# Patient Record
Sex: Female | Born: 1945 | Race: White | Hispanic: No | Marital: Married | Smoking: Never smoker
Health system: Southern US, Community
[De-identification: ages and names within clinical notes are randomized; demographics above are authoritative.]

## PROBLEM LIST (undated history)

## (undated) DIAGNOSIS — E039 Hypothyroidism, unspecified: Secondary | ICD-10-CM

## (undated) DIAGNOSIS — M1990 Unspecified osteoarthritis, unspecified site: Secondary | ICD-10-CM

## (undated) DIAGNOSIS — G4700 Insomnia, unspecified: Secondary | ICD-10-CM

## (undated) DIAGNOSIS — C6990 Malignant neoplasm of unspecified site of unspecified eye: Secondary | ICD-10-CM

## (undated) DIAGNOSIS — K219 Gastro-esophageal reflux disease without esophagitis: Secondary | ICD-10-CM

## (undated) DIAGNOSIS — E785 Hyperlipidemia, unspecified: Secondary | ICD-10-CM

## (undated) HISTORY — DX: Hyperlipidemia, unspecified: E78.5

## (undated) HISTORY — PX: ABDOMINAL HYSTERECTOMY: SHX81

## (undated) HISTORY — DX: Insomnia, unspecified: G47.00

## (undated) HISTORY — DX: Hypothyroidism, unspecified: E03.9

## (undated) HISTORY — PX: ROOT CANAL: SHX2363

## (undated) HISTORY — DX: Gastro-esophageal reflux disease without esophagitis: K21.9

## (undated) HISTORY — PX: CATARACT EXTRACTION W/ INTRAOCULAR LENS  IMPLANT, BILATERAL: SHX1307

## (undated) HISTORY — PX: EYE SURGERY: SHX253

## (undated) HISTORY — DX: Unspecified osteoarthritis, unspecified site: M19.90

## (undated) HISTORY — PX: NASAL SINUS SURGERY: SHX719

## (undated) HISTORY — PX: TUBAL LIGATION: SHX77

---

## 2004-08-19 ENCOUNTER — Ambulatory Visit: Payer: Self-pay | Admitting: Family Medicine

## 2004-08-30 ENCOUNTER — Ambulatory Visit: Payer: Self-pay | Admitting: Internal Medicine

## 2005-09-21 ENCOUNTER — Ambulatory Visit: Payer: Self-pay | Admitting: Family Medicine

## 2006-10-23 ENCOUNTER — Ambulatory Visit: Payer: Self-pay | Admitting: Family Medicine

## 2006-11-09 ENCOUNTER — Ambulatory Visit: Payer: Self-pay | Admitting: Family Medicine

## 2007-11-13 ENCOUNTER — Ambulatory Visit: Payer: Self-pay | Admitting: Family Medicine

## 2008-12-31 ENCOUNTER — Ambulatory Visit: Payer: Self-pay | Admitting: Obstetrics & Gynecology

## 2009-01-13 ENCOUNTER — Ambulatory Visit: Payer: Self-pay | Admitting: Obstetrics & Gynecology

## 2009-04-14 ENCOUNTER — Ambulatory Visit: Payer: Self-pay | Admitting: Family Medicine

## 2010-01-13 ENCOUNTER — Emergency Department: Payer: Self-pay | Admitting: Emergency Medicine

## 2010-02-02 ENCOUNTER — Ambulatory Visit: Payer: Self-pay | Admitting: Ophthalmology

## 2010-04-06 ENCOUNTER — Ambulatory Visit: Payer: Self-pay | Admitting: Ophthalmology

## 2010-10-08 ENCOUNTER — Ambulatory Visit: Payer: Self-pay | Admitting: Obstetrics & Gynecology

## 2010-10-08 IMAGING — CR DG CHEST 2V
1 series · 2 of 2 positions shown · non-contrast
Comparison: none

REASON FOR EXAM: asthma
COMMENTS:

[Series 1: view not recorded · 0.17mm/px · 2 of 2 slices shown]
[im 1/2]
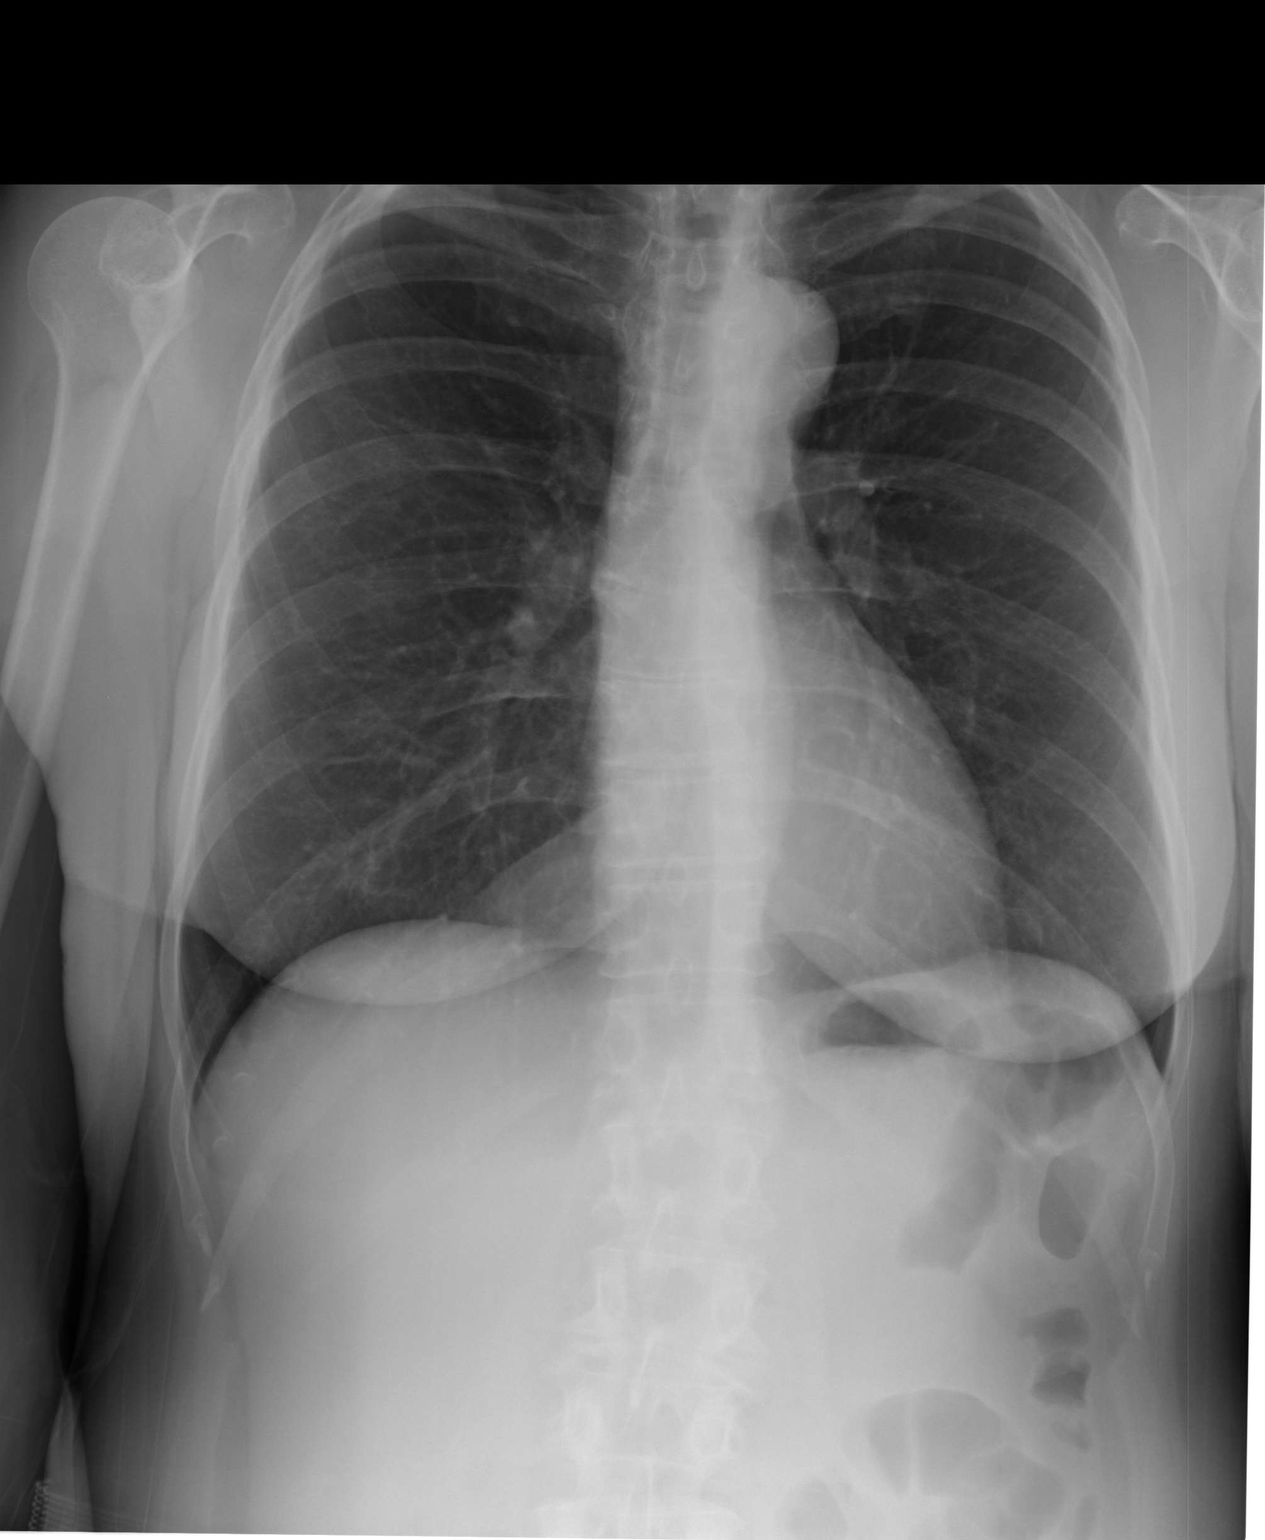
[im 2/2]
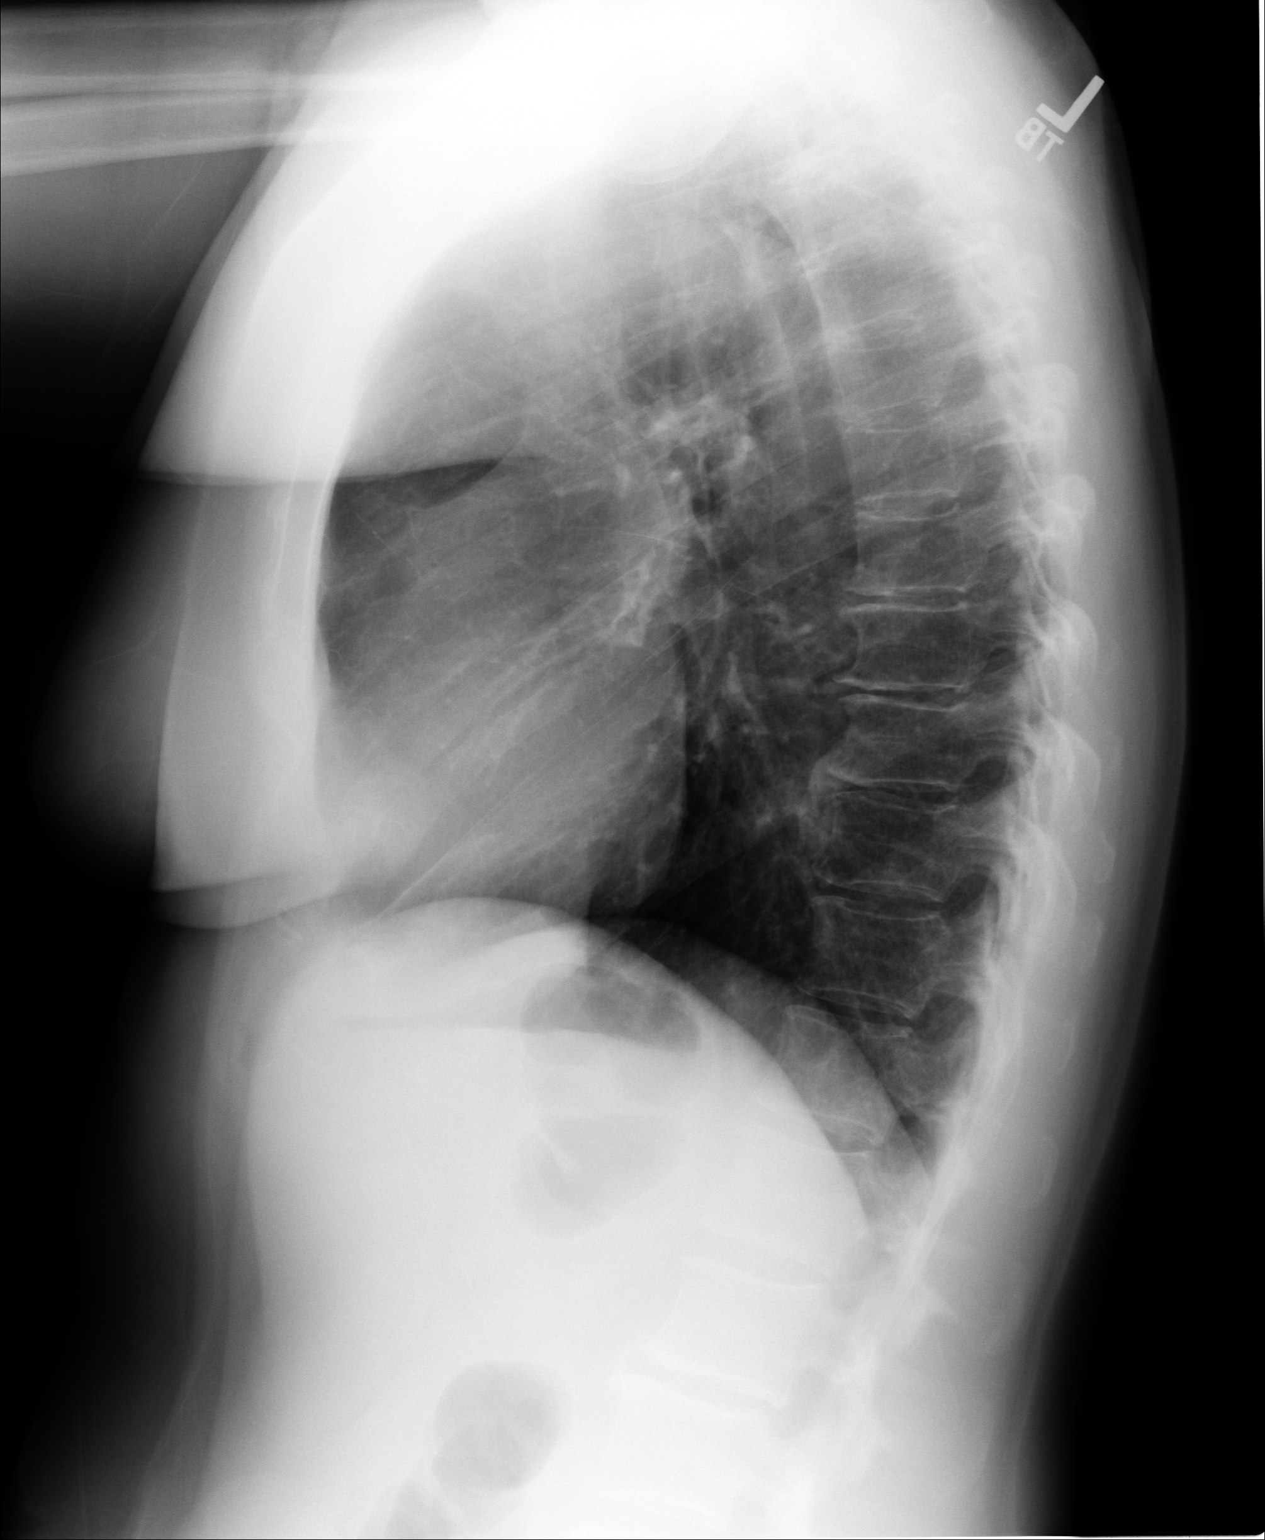

[2 of 2 positions shown; findings below may reference images not displayed]

PROCEDURE:     DXR - DXR CHEST PA (OR AP) AND LATERAL  - [DATE]  [DATE]

RESULT:     The lung fields are clear. No pneumonia, pneumothorax or pleural
effusion is seen. The chest appears mildly hyperinflated bilaterally
compatible with the clinical history of asthma. The heart, mediastinal and
osseous structures show no significant abnormalities.
IMPRESSION: 1. The lung fields are clear.
2. Heart size is normal.
3. The chest appears mildly hyperinflated bilaterally compatible with the
clinical history of asthma.

## 2010-10-14 ENCOUNTER — Ambulatory Visit: Payer: Self-pay | Admitting: Obstetrics & Gynecology

## 2010-10-18 LAB — PATHOLOGY REPORT

## 2011-02-07 ENCOUNTER — Ambulatory Visit: Payer: Self-pay | Admitting: Family Medicine

## 2011-12-13 DIAGNOSIS — E7800 Pure hypercholesterolemia, unspecified: Secondary | ICD-10-CM | POA: Insufficient documentation

## 2011-12-13 DIAGNOSIS — G4700 Insomnia, unspecified: Secondary | ICD-10-CM | POA: Insufficient documentation

## 2012-06-11 ENCOUNTER — Ambulatory Visit: Payer: Self-pay | Admitting: Family Medicine

## 2014-12-16 DIAGNOSIS — K219 Gastro-esophageal reflux disease without esophagitis: Secondary | ICD-10-CM | POA: Insufficient documentation

## 2014-12-16 DIAGNOSIS — M1990 Unspecified osteoarthritis, unspecified site: Secondary | ICD-10-CM | POA: Insufficient documentation

## 2014-12-16 DIAGNOSIS — E785 Hyperlipidemia, unspecified: Secondary | ICD-10-CM | POA: Insufficient documentation

## 2014-12-16 DIAGNOSIS — M25512 Pain in left shoulder: Secondary | ICD-10-CM | POA: Insufficient documentation

## 2014-12-16 DIAGNOSIS — M545 Low back pain, unspecified: Secondary | ICD-10-CM | POA: Insufficient documentation

## 2014-12-16 DIAGNOSIS — M25542 Pain in joints of left hand: Secondary | ICD-10-CM

## 2014-12-16 DIAGNOSIS — R531 Weakness: Secondary | ICD-10-CM | POA: Insufficient documentation

## 2014-12-16 DIAGNOSIS — G8929 Other chronic pain: Secondary | ICD-10-CM | POA: Insufficient documentation

## 2014-12-16 DIAGNOSIS — M542 Cervicalgia: Secondary | ICD-10-CM

## 2014-12-16 DIAGNOSIS — M25541 Pain in joints of right hand: Secondary | ICD-10-CM | POA: Insufficient documentation

## 2014-12-24 DIAGNOSIS — M353 Polymyalgia rheumatica: Secondary | ICD-10-CM | POA: Insufficient documentation

## 2015-01-16 DIAGNOSIS — R229 Localized swelling, mass and lump, unspecified: Secondary | ICD-10-CM | POA: Diagnosis not present

## 2015-01-21 DIAGNOSIS — M542 Cervicalgia: Secondary | ICD-10-CM | POA: Diagnosis not present

## 2015-01-21 DIAGNOSIS — E785 Hyperlipidemia, unspecified: Secondary | ICD-10-CM | POA: Diagnosis not present

## 2015-01-21 DIAGNOSIS — Z7952 Long term (current) use of systemic steroids: Secondary | ICD-10-CM | POA: Diagnosis not present

## 2015-01-21 DIAGNOSIS — M353 Polymyalgia rheumatica: Secondary | ICD-10-CM | POA: Diagnosis not present

## 2015-01-21 DIAGNOSIS — G8929 Other chronic pain: Secondary | ICD-10-CM | POA: Diagnosis not present

## 2015-01-21 DIAGNOSIS — M25512 Pain in left shoulder: Secondary | ICD-10-CM | POA: Diagnosis not present

## 2015-01-21 DIAGNOSIS — Z791 Long term (current) use of non-steroidal anti-inflammatories (NSAID): Secondary | ICD-10-CM | POA: Diagnosis not present

## 2015-01-21 DIAGNOSIS — M150 Primary generalized (osteo)arthritis: Secondary | ICD-10-CM | POA: Diagnosis not present

## 2015-01-21 DIAGNOSIS — K219 Gastro-esophageal reflux disease without esophagitis: Secondary | ICD-10-CM | POA: Diagnosis not present

## 2015-01-21 DIAGNOSIS — M545 Low back pain: Secondary | ICD-10-CM | POA: Diagnosis not present

## 2015-01-21 DIAGNOSIS — E039 Hypothyroidism, unspecified: Secondary | ICD-10-CM | POA: Diagnosis not present

## 2015-01-21 DIAGNOSIS — M25541 Pain in joints of right hand: Secondary | ICD-10-CM | POA: Diagnosis not present

## 2015-02-17 DIAGNOSIS — S8991XA Unspecified injury of right lower leg, initial encounter: Secondary | ICD-10-CM | POA: Diagnosis not present

## 2015-02-18 DIAGNOSIS — M353 Polymyalgia rheumatica: Secondary | ICD-10-CM | POA: Diagnosis not present

## 2015-02-18 DIAGNOSIS — E039 Hypothyroidism, unspecified: Secondary | ICD-10-CM | POA: Diagnosis not present

## 2015-02-18 DIAGNOSIS — M542 Cervicalgia: Secondary | ICD-10-CM | POA: Diagnosis not present

## 2015-02-18 DIAGNOSIS — M545 Low back pain: Secondary | ICD-10-CM | POA: Diagnosis not present

## 2015-02-18 DIAGNOSIS — G8929 Other chronic pain: Secondary | ICD-10-CM | POA: Diagnosis not present

## 2015-02-18 DIAGNOSIS — Z791 Long term (current) use of non-steroidal anti-inflammatories (NSAID): Secondary | ICD-10-CM | POA: Diagnosis not present

## 2015-02-18 DIAGNOSIS — M150 Primary generalized (osteo)arthritis: Secondary | ICD-10-CM | POA: Diagnosis not present

## 2015-02-18 DIAGNOSIS — M25541 Pain in joints of right hand: Secondary | ICD-10-CM | POA: Diagnosis not present

## 2015-02-18 DIAGNOSIS — R531 Weakness: Secondary | ICD-10-CM | POA: Diagnosis not present

## 2015-02-18 DIAGNOSIS — Z7952 Long term (current) use of systemic steroids: Secondary | ICD-10-CM | POA: Diagnosis not present

## 2015-02-18 DIAGNOSIS — M25542 Pain in joints of left hand: Secondary | ICD-10-CM | POA: Diagnosis not present

## 2015-03-18 DIAGNOSIS — E785 Hyperlipidemia, unspecified: Secondary | ICD-10-CM | POA: Diagnosis not present

## 2015-03-18 DIAGNOSIS — J3089 Other allergic rhinitis: Secondary | ICD-10-CM | POA: Diagnosis not present

## 2015-03-18 DIAGNOSIS — M542 Cervicalgia: Secondary | ICD-10-CM | POA: Diagnosis not present

## 2015-03-18 DIAGNOSIS — Z791 Long term (current) use of non-steroidal anti-inflammatories (NSAID): Secondary | ICD-10-CM | POA: Diagnosis not present

## 2015-03-18 DIAGNOSIS — E039 Hypothyroidism, unspecified: Secondary | ICD-10-CM | POA: Diagnosis not present

## 2015-03-18 DIAGNOSIS — M150 Primary generalized (osteo)arthritis: Secondary | ICD-10-CM | POA: Diagnosis not present

## 2015-03-18 DIAGNOSIS — K219 Gastro-esophageal reflux disease without esophagitis: Secondary | ICD-10-CM | POA: Diagnosis not present

## 2015-03-18 DIAGNOSIS — M545 Low back pain: Secondary | ICD-10-CM | POA: Diagnosis not present

## 2015-03-18 DIAGNOSIS — Z7952 Long term (current) use of systemic steroids: Secondary | ICD-10-CM | POA: Diagnosis not present

## 2015-03-18 DIAGNOSIS — M353 Polymyalgia rheumatica: Secondary | ICD-10-CM | POA: Diagnosis not present

## 2015-03-18 DIAGNOSIS — G8929 Other chronic pain: Secondary | ICD-10-CM | POA: Diagnosis not present

## 2015-03-18 DIAGNOSIS — Z23 Encounter for immunization: Secondary | ICD-10-CM | POA: Diagnosis not present

## 2015-04-03 DIAGNOSIS — Z961 Presence of intraocular lens: Secondary | ICD-10-CM | POA: Diagnosis not present

## 2015-04-15 DIAGNOSIS — M542 Cervicalgia: Secondary | ICD-10-CM | POA: Diagnosis not present

## 2015-04-15 DIAGNOSIS — M25512 Pain in left shoulder: Secondary | ICD-10-CM | POA: Diagnosis not present

## 2015-04-15 DIAGNOSIS — M25541 Pain in joints of right hand: Secondary | ICD-10-CM | POA: Diagnosis not present

## 2015-04-15 DIAGNOSIS — K219 Gastro-esophageal reflux disease without esophagitis: Secondary | ICD-10-CM | POA: Diagnosis not present

## 2015-04-15 DIAGNOSIS — Z7952 Long term (current) use of systemic steroids: Secondary | ICD-10-CM | POA: Diagnosis not present

## 2015-04-15 DIAGNOSIS — Z791 Long term (current) use of non-steroidal anti-inflammatories (NSAID): Secondary | ICD-10-CM | POA: Diagnosis not present

## 2015-04-15 DIAGNOSIS — M353 Polymyalgia rheumatica: Secondary | ICD-10-CM | POA: Diagnosis not present

## 2015-04-15 DIAGNOSIS — M545 Low back pain: Secondary | ICD-10-CM | POA: Diagnosis not present

## 2015-04-15 DIAGNOSIS — E785 Hyperlipidemia, unspecified: Secondary | ICD-10-CM | POA: Diagnosis not present

## 2015-04-15 DIAGNOSIS — R531 Weakness: Secondary | ICD-10-CM | POA: Diagnosis not present

## 2015-04-15 DIAGNOSIS — J3089 Other allergic rhinitis: Secondary | ICD-10-CM | POA: Diagnosis not present

## 2015-04-15 DIAGNOSIS — E039 Hypothyroidism, unspecified: Secondary | ICD-10-CM | POA: Diagnosis not present

## 2015-06-02 DIAGNOSIS — N390 Urinary tract infection, site not specified: Secondary | ICD-10-CM | POA: Diagnosis not present

## 2015-06-02 DIAGNOSIS — E039 Hypothyroidism, unspecified: Secondary | ICD-10-CM | POA: Diagnosis not present

## 2015-06-02 DIAGNOSIS — F5104 Psychophysiologic insomnia: Secondary | ICD-10-CM | POA: Diagnosis not present

## 2015-06-02 DIAGNOSIS — K219 Gastro-esophageal reflux disease without esophagitis: Secondary | ICD-10-CM | POA: Diagnosis not present

## 2015-06-02 DIAGNOSIS — M129 Arthropathy, unspecified: Secondary | ICD-10-CM | POA: Diagnosis not present

## 2015-06-02 DIAGNOSIS — E7800 Pure hypercholesterolemia, unspecified: Secondary | ICD-10-CM | POA: Diagnosis not present

## 2015-06-15 DIAGNOSIS — M25542 Pain in joints of left hand: Secondary | ICD-10-CM | POA: Diagnosis not present

## 2015-06-15 DIAGNOSIS — M353 Polymyalgia rheumatica: Secondary | ICD-10-CM | POA: Diagnosis not present

## 2015-06-15 DIAGNOSIS — M150 Primary generalized (osteo)arthritis: Secondary | ICD-10-CM

## 2015-06-15 DIAGNOSIS — M25541 Pain in joints of right hand: Secondary | ICD-10-CM | POA: Diagnosis not present

## 2015-06-15 DIAGNOSIS — M159 Polyosteoarthritis, unspecified: Secondary | ICD-10-CM | POA: Insufficient documentation

## 2015-06-15 DIAGNOSIS — M545 Low back pain: Secondary | ICD-10-CM | POA: Diagnosis not present

## 2015-06-15 DIAGNOSIS — Z87440 Personal history of urinary (tract) infections: Secondary | ICD-10-CM | POA: Insufficient documentation

## 2015-06-15 DIAGNOSIS — M542 Cervicalgia: Secondary | ICD-10-CM | POA: Diagnosis not present

## 2015-09-09 DIAGNOSIS — H1851 Endothelial corneal dystrophy: Secondary | ICD-10-CM | POA: Diagnosis not present

## 2015-12-07 DIAGNOSIS — M129 Arthropathy, unspecified: Secondary | ICD-10-CM | POA: Diagnosis not present

## 2015-12-07 DIAGNOSIS — E039 Hypothyroidism, unspecified: Secondary | ICD-10-CM | POA: Diagnosis not present

## 2015-12-07 DIAGNOSIS — E7800 Pure hypercholesterolemia, unspecified: Secondary | ICD-10-CM | POA: Diagnosis not present

## 2015-12-07 DIAGNOSIS — F5104 Psychophysiologic insomnia: Secondary | ICD-10-CM | POA: Diagnosis not present

## 2015-12-07 DIAGNOSIS — K219 Gastro-esophageal reflux disease without esophagitis: Secondary | ICD-10-CM | POA: Diagnosis not present

## 2015-12-09 ENCOUNTER — Other Ambulatory Visit: Payer: Self-pay

## 2015-12-09 ENCOUNTER — Ambulatory Visit (INDEPENDENT_AMBULATORY_CARE_PROVIDER_SITE_OTHER): Payer: PPO | Admitting: Gastroenterology

## 2015-12-09 ENCOUNTER — Encounter: Payer: Self-pay | Admitting: Gastroenterology

## 2015-12-09 VITALS — BP 141/89 | HR 76 | Temp 98.3°F | Ht 65.0 in | Wt 180.0 lb

## 2015-12-09 DIAGNOSIS — R1013 Epigastric pain: Secondary | ICD-10-CM | POA: Diagnosis not present

## 2015-12-09 DIAGNOSIS — R1310 Dysphagia, unspecified: Secondary | ICD-10-CM | POA: Diagnosis not present

## 2015-12-09 DIAGNOSIS — E039 Hypothyroidism, unspecified: Secondary | ICD-10-CM | POA: Insufficient documentation

## 2015-12-09 NOTE — Progress Notes (Addendum)
Gastroenterology Consultation  Referring Provider:     No ref. provider found Primary Care Physician:  [facility], Chrissie Noa, MD Primary Gastroenterologist:  Dr. Allen Norris     Reason for Consultation:     Dysphagia and GERD        HPI:   Christine Savage is a 70 y.o. y/o female referred for consultation & management of Dysphagia and GERD by Dr. Ellison Hughs, Chrissie Noa, MD.  This patient comes in today with a history of long-standing heartburn/dyspepsia. The patient states that she sometimes feels food coming up and also has a lot of burping. The patient also had a colonoscopy 11 years ago and has not had a repeat since. The patient states that one of her main reasons for coming in today is because she has some dysphagia with pills but nothing else. The patient states that one time she had sliced apples that felt like they got stuck but eventually pass. There is no report of any unexplained weight loss, fevers, chills, nausea or vomiting. The patient states that she was recommended 11 years ago to have a upper endoscopy because she had a colonoscopy without any cause for her anemia at that time. The patient does not come today with any history of anemia presently. The patient states that when she takes her pills in the morning she can he'll a feeling in her throat where the pills feel like there stuck and she has to drink copious amounts of water to get the pills down.  Past Medical History:  Diagnosis Date  . Arthritis   . GERD (gastroesophageal reflux disease)   . Hyperlipidemia   . Hypothyroidism   . Insomnia     Past Surgical History:  Procedure Laterality Date  . ROOT CANAL    . TUBAL LIGATION      Prior to Admission medications   Medication Sig Start Date End Date Taking? Authorizing Provider  acetaminophen (TYLENOL) 650 MG CR tablet Take 1,300 mg by mouth.   Yes Historical Provider, MD  Calcium Carbonate-Vitamin D 600-400 MG-UNIT tablet Take by mouth.   Yes Historical Provider, MD    Cyanocobalamin (RA VITAMIN B-12 TR) 1000 MCG TBCR Take by mouth.   Yes Historical Provider, MD  diphenhydrAMINE (BENADRYL) 25 mg capsule Take by mouth.   Yes Historical Provider, MD  docusate sodium (COLACE) 100 MG capsule Take 100 mg by mouth.   Yes Historical Provider, MD  levothyroxine (SYNTHROID, LEVOTHROID) 50 MCG tablet TAKE 1 TABLET (50 MCG TOTAL) BY MOUTH ONCE DAILY. 06/29/15  Yes Historical Provider, MD  levothyroxine (SYNTHROID, LEVOTHROID) 50 MCG tablet  09/27/15  Yes Historical Provider, MD  Omega-3 Fatty Acids (FISH OIL PO) Take by mouth.   Yes Historical Provider, MD  omeprazole (PRILOSEC) 20 MG capsule TAKE 1 CAPSULE (20 MG TOTAL) BY MOUTH ONCE DAILY AS NEEDED. 07/23/15  Yes Historical Provider, MD  pravastatin (PRAVACHOL) 20 MG tablet Take by mouth. 06/02/15 06/01/16 Yes Historical Provider, MD  sodium chloride (OCEAN) 0.65 % nasal spray Place into the nose.   Yes Historical Provider, MD  celecoxib (CELEBREX) 200 MG capsule Take 200 mg by mouth.    Historical Provider, MD  fexofenadine (ALLEGRA) 180 MG tablet Take 180 mg by mouth.    Historical Provider, MD  fluticasone (FLONASE) 50 MCG/ACT nasal spray Place into the nose. 06/05/12   Historical Provider, MD    Family History  Problem Relation Age of Onset  . Coronary artery disease Mother   . Cancer Father   .  Coronary artery disease Father   . Osteoarthritis Brother   . Cancer Brother      Social History  Substance Use Topics  . Smoking status: Never Smoker  . Smokeless tobacco: Never Used  . Alcohol use No    Allergies as of 12/09/2015 - Review Complete 12/09/2015  Allergen Reaction Noted  . Latex Rash 02/17/2015  . Sulfa antibiotics Nausea Only 12/16/2014    Review of Systems:    All systems reviewed and negative except where noted in HPI.   Physical Exam:  BP (!) 141/89   Pulse 76   Temp 98.3 F (36.8 C) (Oral)   Ht 5\' 5"  (1.651 m)   Wt 180 lb (81.6 kg)   BMI 29.95 kg/m  No LMP recorded. Psych:  Alert  and cooperative. Normal mood and affect. General:   Alert,  Well-developed, well-nourished, pleasant and cooperative in NAD Head:  Normocephalic and atraumatic. Eyes:  Sclera clear, no icterus.   Conjunctiva pink. Ears:  Normal auditory acuity. Nose:  No deformity, discharge, or lesions. Mouth:  No deformity or lesions,oropharynx pink & moist. Neck:  Supple; no masses or thyromegaly. Lungs:  Respirations even and unlabored.  Clear throughout to auscultation.   No wheezes, crackles, or rhonchi. No acute distress. Heart:  Regular rate and rhythm; no murmurs, clicks, rubs, or gallops. Abdomen:  Normal bowel sounds.  No bruits.  Soft, non-tender and non-distended without masses, hepatosplenomegaly or hernias noted.  No guarding or rebound tenderness.  Negative Carnett sign.   Rectal:  Deferred.  Msk:  Symmetrical without gross deformities.  Good, equal movement & strength bilaterally. Pulses:  Normal pulses noted. Extremities:  No clubbing or edema.  No cyanosis. Neurologic:  Alert and oriented x3;  grossly normal neurologically. Skin:  Intact without significant lesions or rashes.  No jaundice. Lymph Nodes:  No significant cervical adenopathy. Psych:  Alert and cooperative. Normal mood and affect.  Imaging Studies: No results found.  Assessment and Plan:   Christine Savage is a 70 y.o. y/o female comes in today with a history of dyspepsia and also dysphagia to pills. The patient is also due for colonoscopy since she has not had one 11 years. The patient has been told to seek out a cup made special for pill ingestion which washes a a lot of water over the pill as you swallowing. The patient has also been told that she should be set up for an EGD due to the dyspepsia. She will also have her colonoscopy at the same time as her EGD. I have discussed risks & benefits which include, but are not limited to, bleeding, infection, perforation & drug reaction.  The patient agrees with this plan & written  consent will be obtained.       Lucilla Lame, MD. Marval Regal   Note: This dictation was prepared with Dragon dictation along with smaller phrase technology. Any transcriptional errors that result from this process are unintentional.

## 2015-12-10 NOTE — Discharge Instructions (Signed)

## 2015-12-14 ENCOUNTER — Encounter: Payer: Self-pay | Admitting: *Deleted

## 2015-12-17 ENCOUNTER — Ambulatory Visit
Admission: RE | Admit: 2015-12-17 | Discharge: 2015-12-17 | Disposition: A | Discharge status: Home / Self Care | Payer: PPO | Source: Ambulatory Visit | Attending: Gastroenterology | Admitting: Gastroenterology

## 2015-12-17 ENCOUNTER — Encounter
Admission: RE | Disposition: A | Discharge status: Home / Self Care | Payer: Self-pay | Source: Ambulatory Visit | Attending: Gastroenterology

## 2015-12-17 ENCOUNTER — Ambulatory Visit: Payer: PPO | Admitting: Anesthesiology

## 2015-12-17 DIAGNOSIS — D124 Benign neoplasm of descending colon: Secondary | ICD-10-CM

## 2015-12-17 DIAGNOSIS — G4700 Insomnia, unspecified: Secondary | ICD-10-CM | POA: Insufficient documentation

## 2015-12-17 DIAGNOSIS — Z1211 Encounter for screening for malignant neoplasm of colon: Secondary | ICD-10-CM

## 2015-12-17 DIAGNOSIS — K449 Diaphragmatic hernia without obstruction or gangrene: Secondary | ICD-10-CM | POA: Insufficient documentation

## 2015-12-17 DIAGNOSIS — R1013 Epigastric pain: Secondary | ICD-10-CM | POA: Diagnosis not present

## 2015-12-17 DIAGNOSIS — E785 Hyperlipidemia, unspecified: Secondary | ICD-10-CM | POA: Insufficient documentation

## 2015-12-17 DIAGNOSIS — K648 Other hemorrhoids: Secondary | ICD-10-CM | POA: Diagnosis not present

## 2015-12-17 DIAGNOSIS — E039 Hypothyroidism, unspecified: Secondary | ICD-10-CM | POA: Insufficient documentation

## 2015-12-17 DIAGNOSIS — K219 Gastro-esophageal reflux disease without esophagitis: Secondary | ICD-10-CM | POA: Insufficient documentation

## 2015-12-17 DIAGNOSIS — R1310 Dysphagia, unspecified: Secondary | ICD-10-CM

## 2015-12-17 DIAGNOSIS — K30 Functional dyspepsia: Secondary | ICD-10-CM

## 2015-12-17 HISTORY — PX: POLYPECTOMY: SHX5525

## 2015-12-17 HISTORY — PX: ESOPHAGOGASTRODUODENOSCOPY (EGD) WITH PROPOFOL: SHX5813

## 2015-12-17 HISTORY — PX: COLONOSCOPY WITH PROPOFOL: SHX5780

## 2015-12-17 SURGERY — COLONOSCOPY WITH PROPOFOL
Anesthesia: Monitor Anesthesia Care | Wound class: Contaminated

## 2015-12-17 MED ORDER — SIMETHICONE 40 MG/0.6ML PO SUSP
ORAL | Status: DC | PRN
  Administered 2015-12-17: 11:00:00

## 2015-12-17 MED ORDER — LIDOCAINE HCL (CARDIAC) 20 MG/ML IV SOLN
INTRAVENOUS | Status: DC | PRN
  Administered 2015-12-17: 50 mg via INTRAVENOUS

## 2015-12-17 MED ORDER — PROPOFOL 10 MG/ML IV BOLUS
INTRAVENOUS | Status: DC | PRN
  Administered 2015-12-17: 30 mg via INTRAVENOUS
  Administered 2015-12-17: 100 mg via INTRAVENOUS
  Administered 2015-12-17: 20 mg via INTRAVENOUS
  Administered 2015-12-17: 30 mg via INTRAVENOUS
  Administered 2015-12-17 (×2): 50 mg via INTRAVENOUS
  Administered 2015-12-17: 20 mg via INTRAVENOUS

## 2015-12-17 MED ORDER — LACTATED RINGERS IV SOLN
INTRAVENOUS | Status: DC
  Administered 2015-12-17: 09:00:00 via INTRAVENOUS

## 2015-12-17 MED ORDER — GLYCOPYRROLATE 0.2 MG/ML IJ SOLN
INTRAMUSCULAR | Status: DC | PRN
  Administered 2015-12-17: 0.1 mg via INTRAVENOUS

## 2015-12-17 SURGICAL SUPPLY — 35 items
BALLN DILATOR 10-12 8 (BALLOONS)
BALLN DILATOR 12-15 8 (BALLOONS)
BALLN DILATOR 15-18 8 (BALLOONS)
BALLN DILATOR CRE 0-12 8 (BALLOONS)
BALLN DILATOR ESOPH 8 10 CRE (MISCELLANEOUS) IMPLANT
BALLOON DILATOR 12-15 8 (BALLOONS) IMPLANT
BALLOON DILATOR 15-18 8 (BALLOONS) IMPLANT
BALLOON DILATOR CRE 0-12 8 (BALLOONS) IMPLANT
BLOCK BITE 60FR ADLT L/F GRN (MISCELLANEOUS) ×3 IMPLANT
CANISTER SUCT 1200ML W/VALVE (MISCELLANEOUS) ×3 IMPLANT
CLIP HMST 235XBRD CATH ROT (MISCELLANEOUS) IMPLANT
CLIP RESOLUTION 360 11X235 (MISCELLANEOUS)
FCP ESCP3.2XJMB 240X2.8X (MISCELLANEOUS)
FORCEPS BIOP RAD 4 LRG CAP 4 (CUTTING FORCEPS) IMPLANT
FORCEPS BIOP RJ4 240 W/NDL (MISCELLANEOUS)
FORCEPS ESCP3.2XJMB 240X2.8X (MISCELLANEOUS) IMPLANT
GOWN CVR UNV OPN BCK APRN NK (MISCELLANEOUS) ×2 IMPLANT
GOWN ISOL THUMB LOOP REG UNIV (MISCELLANEOUS) ×4
INJECTOR VARIJECT VIN23 (MISCELLANEOUS) IMPLANT
KIT DEFENDO VALVE AND CONN (KITS) IMPLANT
KIT ENDO PROCEDURE OLY (KITS) ×3 IMPLANT
MARKER SPOT ENDO TATTOO 5ML (MISCELLANEOUS) IMPLANT
PAD GROUND ADULT SPLIT (MISCELLANEOUS) IMPLANT
PROBE APC STR FIRE (PROBE) IMPLANT
RETRIEVER NET PLAT FOOD (MISCELLANEOUS) IMPLANT
RETRIEVER NET ROTH 2.5X230 LF (MISCELLANEOUS) IMPLANT
SNARE SHORT THROW 13M SML OVAL (MISCELLANEOUS) ×3 IMPLANT
SNARE SHORT THROW 30M LRG OVAL (MISCELLANEOUS) IMPLANT
SNARE SNG USE RND 15MM (INSTRUMENTS) IMPLANT
SPOT EX ENDOSCOPIC TATTOO (MISCELLANEOUS)
SYR INFLATION 60ML (SYRINGE) IMPLANT
TRAP ETRAP POLY (MISCELLANEOUS) ×3 IMPLANT
VARIJECT INJECTOR VIN23 (MISCELLANEOUS)
WATER STERILE IRR 250ML POUR (IV SOLUTION) ×3 IMPLANT
WIRE CRE 18-20MM 8CM F G (MISCELLANEOUS) IMPLANT

## 2015-12-17 NOTE — Op Note (Signed)
[facility] Gastroenterology Patient Name: Christine Savage Procedure Date: 12/17/2015 10:11 AM MRN: [id number] Account #: [id number] Date of Birth: 1945/02/13 Admit Type: Outpatient Age: 70 Room: [facility] OR ROOM 01 Gender: Female Note Status: Finalized Procedure:            Upper GI endoscopy Indications:          Functional Dyspepsia, Dysphagia Providers:            Lucilla Lame MD, MD Referring MD:         Sofie Hartigan (Referring MD) Medicines:            Propofol per Anesthesia Complications:        No immediate complications. Procedure:            Pre-Anesthesia Assessment:                       - Prior to the procedure, a History and Physical was                        performed, and patient medications and allergies were                        reviewed. The patient's tolerance of previous                        anesthesia was also reviewed. The risks and benefits of                        the procedure and the sedation options and risks were                        discussed with the patient. All questions were                        answered, and informed consent was obtained. Prior                        Anticoagulants: The patient has taken no previous                        anticoagulant or antiplatelet agents. ASA Grade                        Assessment: II - A patient with mild systemic disease.                        After reviewing the risks and benefits, the patient was                        deemed in satisfactory condition to undergo the                        procedure.                       After obtaining informed consent, the endoscope was                        passed under direct vision. Throughout the procedure,  the patient's blood pressure, pulse, and oxygen                        saturations were monitored continuously. The Olympus                        [id number]-HQ190 Endoscope (S#. [id number]) was introduced          through the mouth, and advanced to the second part of                        duodenum. The upper GI endoscopy was accomplished                        without difficulty. The patient tolerated the procedure                        well. Findings:      A small hiatal hernia was present.      The stomach was normal.      The examined duodenum was normal. Impression:           - Small hiatal hernia.                       - Normal stomach.                       - Normal examined duodenum.                       - No specimens collected. Recommendation:       - Discharge patient to home.                       - Resume previous diet.                       - Continue present medications.                       - Perform a colonoscopy today. Procedure Code(s):    --- Professional ---                       [phone], Esophagogastroduodenoscopy, flexible, transoral;                        diagnostic, including collection of specimen(s) by                        brushing or washing, when performed (separate procedure) Diagnosis Code(s):    --- Professional ---                       R13.10, Dysphagia, unspecified                       K30, Functional dyspepsia CPT copyright 2016 American Medical Association. All rights reserved. The codes documented in this report are preliminary and upon coder review may  be revised to meet current compliance requirements. Lucilla Lame MD, MD 12/17/2015 10:25:07 AM This report has been signed electronically. Number of Addenda: 0 Note Initiated On: 12/17/2015 10:11 AM      [facility]

## 2015-12-17 NOTE — Anesthesia Procedure Notes (Signed)
Procedure Name: MAC Date/Time: 12/17/2015 10:16 AM Performed by: Cameron Ali Pre-anesthesia Checklist: Patient identified, Emergency Drugs available, Suction available, Timeout performed and Patient being monitored Patient Re-evaluated:Patient Re-evaluated prior to inductionOxygen Delivery Method: Nasal cannula Placement Confirmation: positive ETCO2

## 2015-12-17 NOTE — Op Note (Signed)
[facility] Gastroenterology Patient Name: Christine Savage Procedure Date: 12/17/2015 10:18 AM MRN: [id number] Account #: [id number] Date of Birth: 1945/11/05 Admit Type: Outpatient Age: 71 Room: [facility] OR ROOM 01 Gender: Female Note Status: Finalized Procedure:            Colonoscopy Indications:          Screening for colorectal malignant neoplasm Providers:            Lucilla Lame MD, MD Referring MD:         Sofie Hartigan (Referring MD) Medicines:            Propofol per Anesthesia Complications:        No immediate complications. Procedure:            Pre-Anesthesia Assessment:                       - Prior to the procedure, a History and Physical was                        performed, and patient medications and allergies were                        reviewed. The patient's tolerance of previous                        anesthesia was also reviewed. The risks and benefits of                        the procedure and the sedation options and risks were                        discussed with the patient. All questions were                        answered, and informed consent was obtained. Prior                        Anticoagulants: The patient has taken no previous                        anticoagulant or antiplatelet agents. ASA Grade                        Assessment: II - A patient with mild systemic disease.                        After reviewing the risks and benefits, the patient was                        deemed in satisfactory condition to undergo the                        procedure.                       After obtaining informed consent, the colonoscope was                        passed under direct vision. Throughout the procedure,  the patient's blood pressure, pulse, and oxygen                        saturations were monitored continuously. The was                        introduced through the anus and advanced to the the                cecum, identified by appendiceal orifice and ileocecal                        valve. The colonoscopy was performed without                        difficulty. The patient tolerated the procedure well.                        The quality of the bowel preparation was excellent. Findings:      The perianal and digital rectal examinations were normal.      A 7 mm polyp was found in the descending colon. The polyp was sessile.       The polyp was removed with a cold snare. Resection and retrieval were       complete.      Non-bleeding internal hemorrhoids were found during retroflexion. The       hemorrhoids were Grade II (internal hemorrhoids that prolapse but reduce       spontaneously). Impression:           - One 7 mm polyp in the descending colon, removed with                        a cold snare. Resected and retrieved.                       - Non-bleeding internal hemorrhoids. Recommendation:       - Discharge patient to home.                       - Resume previous diet.                       - Continue present medications.                       - Repeat colonoscopy in 5 years if polyp adenoma and 10                        years if hyperplastic Procedure Code(s):    --- Professional ---                       [phone], Colonoscopy, flexible; with removal of tumor(s),                        polyp(s), or other lesion(s) by snare technique Diagnosis Code(s):    --- Professional ---                       Z12.11, Encounter for screening for malignant neoplasm  of colon                       D12.4, Benign neoplasm of descending colon CPT copyright 2016 American Medical Association. All rights reserved. The codes documented in this report are preliminary and upon coder review may  be revised to meet current compliance requirements. Lucilla Lame MD, MD 12/17/2015 10:40:12 AM This report has been signed electronically. Number of Addenda: 0 Note Initiated On:  12/17/2015 10:18 AM Scope Withdrawal Time: 0 hours 7 minutes 24 seconds  Total Procedure Duration: 0 hours 10 minutes 50 seconds       [facility]

## 2015-12-17 NOTE — Anesthesia Preprocedure Evaluation (Signed)
Anesthesia Evaluation  Patient identified by MRN, date of birth, ID band Patient awake    Reviewed: Allergy & Precautions, H&P , NPO status , Patient's Chart, lab work & pertinent test results  Airway Mallampati: II  TM Distance: >3 FB Neck ROM: full    Dental no notable dental hx.    Pulmonary    Pulmonary exam normal        Cardiovascular Normal cardiovascular exam     Neuro/Psych    GI/Hepatic GERD  ,  Endo/Other  Hypothyroidism   Renal/GU      Musculoskeletal   Abdominal   Peds  Hematology   Anesthesia Other Findings   Reproductive/Obstetrics                             Anesthesia Physical Anesthesia Plan  ASA: II  Anesthesia Plan: MAC   Post-op Pain Management:    Induction:   Airway Management Planned:   Additional Equipment:   Intra-op Plan:   Post-operative Plan:   Informed Consent: I have reviewed the patients History and Physical, chart, labs and discussed the procedure including the risks, benefits and alternatives for the proposed anesthesia with the patient or authorized representative who has indicated his/her understanding and acceptance.     Plan Discussed with:   Anesthesia Plan Comments:         Anesthesia Quick Evaluation  

## 2015-12-17 NOTE — Anesthesia Postprocedure Evaluation (Signed)
Anesthesia Post Note  Patient: Christine Savage  Procedure(s) Performed: Procedure(s) (LRB): COLONOSCOPY WITH PROPOFOL (N/A) ESOPHAGOGASTRODUODENOSCOPY (EGD) WITH PROPOFOL (N/A) POLYPECTOMY (N/A)  Patient location during evaluation: PACU Anesthesia Type: MAC Level of consciousness: awake and alert and oriented Pain management: satisfactory to patient Vital Signs Assessment: post-procedure vital signs reviewed and stable Respiratory status: spontaneous breathing, nonlabored ventilation and respiratory function stable Cardiovascular status: blood pressure returned to baseline and stable Postop Assessment: Adequate PO intake and No signs of nausea or vomiting Anesthetic complications: no    Raliegh Ip

## 2015-12-17 NOTE — H&P (Signed)
Lucilla Lame, MD [facility] [address], [place], [place] [address] Phone: [phone] Fax : [phone]  Primary Care Physician:  [facility], MD Primary Gastroenterologist:  Dr. Allen Norris  Pre-Procedure History & Physical: HPI:  Christine Savage is a 70 y.o. female is here for an endoscopy and colonoscopy.   Past Medical History:  Diagnosis Date  . Arthritis   . GERD (gastroesophageal reflux disease)   . Hyperlipidemia   . Hypothyroidism   . Insomnia     Past Surgical History:  Procedure Laterality Date  . ABDOMINAL HYSTERECTOMY    . CATARACT EXTRACTION W/ INTRAOCULAR LENS  IMPLANT, BILATERAL    . ROOT CANAL    . TUBAL LIGATION      Prior to Admission medications   Medication Sig Start Date End Date Taking? Authorizing Provider  acetaminophen (TYLENOL) 650 MG CR tablet Take 1,300 mg by mouth.   Yes Historical Provider, MD  Calcium Carbonate-Vitamin D 600-400 MG-UNIT tablet Take by mouth.   Yes Historical Provider, MD  Cyanocobalamin (RA VITAMIN B-12 TR) 1000 MCG TBCR Take by mouth.   Yes Historical Provider, MD  Dexlansoprazole (DEXILANT PO) Take by mouth daily.   Yes Historical Provider, MD  diphenhydrAMINE (BENADRYL) 25 mg capsule Take by mouth.   Yes Historical Provider, MD  docusate sodium (COLACE) 100 MG capsule Take 100 mg by mouth.   Yes Historical Provider, MD  fluticasone (FLONASE) 50 MCG/ACT nasal spray Place into the nose. 06/05/12  Yes Historical Provider, MD  levothyroxine (SYNTHROID, LEVOTHROID) 50 MCG tablet TAKE 1 TABLET (50 MCG TOTAL) BY MOUTH ONCE DAILY. 06/29/15  Yes Historical Provider, MD  Omega-3 Fatty Acids (FISH OIL PO) Take by mouth.   Yes Historical Provider, MD  pravastatin (PRAVACHOL) 20 MG tablet Take by mouth. 06/02/15 06/01/16 Yes Historical Provider, MD  sodium chloride (OCEAN) 0.65 % nasal spray Place into the nose.   Yes Historical Provider, MD  celecoxib (CELEBREX) 200 MG capsule Take 200 mg by mouth.    Historical Provider, MD    levothyroxine (SYNTHROID, LEVOTHROID) 50 MCG tablet  09/27/15   Historical Provider, MD    Allergies as of 12/09/2015 - Review Complete 12/09/2015  Allergen Reaction Noted  . Latex Rash 02/17/2015  . Sulfa antibiotics Nausea Only 12/16/2014    Family History  Problem Relation Age of Onset  . Coronary artery disease Mother   . Cancer Father   . Coronary artery disease Father   . Osteoarthritis Brother   . Cancer Brother     Social History   Social History  . Marital status: Married    Spouse name: N/A  . Number of children: N/A  . Years of education: N/A   Occupational History  . Not on file.   Social History Main Topics  . Smoking status: Never Smoker  . Smokeless tobacco: Never Used  . Alcohol use No  . Drug use: No  . Sexual activity: Not on file   Other Topics Concern  . Not on file   Social History Narrative  . No narrative on file    Review of Systems: See HPI, otherwise negative ROS  Physical Exam: BP 132/87   Pulse 83   Temp 98.2 F (36.8 C) (Temporal)   Resp 16   Ht 5\' 5"  (1.651 m)   Wt 172 lb (78 kg)   SpO2 94%   BMI 28.62 kg/m  General:   Alert,  pleasant and cooperative in NAD Head:  Normocephalic and atraumatic. Neck:  Supple; no masses or thyromegaly. Lungs:  Clear throughout to auscultation.    Heart:  Regular rate and rhythm. Abdomen:  Soft, nontender and nondistended. Normal bowel sounds, without guarding, and without rebound.   Neurologic:  Alert and  oriented x4;  grossly normal neurologically.  Impression/Plan: Christine Savage is here for an endoscopy and colonoscopy to be performed for screeing and dysphagia  Risks, benefits, limitations, and alternatives regarding  endoscopy and colonoscopy have been reviewed with the patient.  Questions have been answered.  All parties agreeable.   Lucilla Lame, MD  12/17/2015, 10:07 AM

## 2015-12-17 NOTE — Transfer of Care (Signed)
Immediate Anesthesia Transfer of Care Note  Patient: Christine Savage  Procedure(s) Performed: Procedure(s) with comments: COLONOSCOPY WITH PROPOFOL (N/A) ESOPHAGOGASTRODUODENOSCOPY (EGD) WITH PROPOFOL (N/A) - Latex sensitivity POLYPECTOMY (N/A)  Patient Location: PACU  Anesthesia Type: MAC  Level of Consciousness: awake, alert  and patient cooperative  Airway and Oxygen Therapy: Patient Spontanous Breathing and Patient connected to supplemental oxygen  Post-op Assessment: Post-op Vital signs reviewed, Patient's Cardiovascular Status Stable, Respiratory Function Stable, Patent Airway and No signs of Nausea or vomiting  Post-op Vital Signs: Reviewed and stable  Complications: No apparent anesthesia complications

## 2015-12-18 ENCOUNTER — Encounter: Payer: Self-pay | Admitting: Gastroenterology

## 2015-12-21 ENCOUNTER — Encounter: Payer: Self-pay | Admitting: Gastroenterology

## 2015-12-24 ENCOUNTER — Encounter: Payer: Self-pay | Admitting: Gastroenterology

## 2015-12-25 ENCOUNTER — Telehealth: Payer: Self-pay | Admitting: Gastroenterology

## 2015-12-25 NOTE — Telephone Encounter (Signed)
Patients was checking to see if results are in yet. Please call.

## 2015-12-29 NOTE — Telephone Encounter (Signed)
Pt notified of colonoscopy results.  

## 2016-06-07 DIAGNOSIS — E039 Hypothyroidism, unspecified: Secondary | ICD-10-CM | POA: Diagnosis not present

## 2016-06-07 DIAGNOSIS — E7800 Pure hypercholesterolemia, unspecified: Secondary | ICD-10-CM | POA: Diagnosis not present

## 2016-06-07 DIAGNOSIS — F5104 Psychophysiologic insomnia: Secondary | ICD-10-CM | POA: Diagnosis not present

## 2016-06-07 DIAGNOSIS — M129 Arthropathy, unspecified: Secondary | ICD-10-CM | POA: Diagnosis not present

## 2016-06-07 DIAGNOSIS — K219 Gastro-esophageal reflux disease without esophagitis: Secondary | ICD-10-CM | POA: Diagnosis not present

## 2016-08-08 DIAGNOSIS — H43813 Vitreous degeneration, bilateral: Secondary | ICD-10-CM | POA: Diagnosis not present

## 2016-10-26 DIAGNOSIS — K219 Gastro-esophageal reflux disease without esophagitis: Secondary | ICD-10-CM | POA: Diagnosis not present

## 2016-12-08 DIAGNOSIS — Z23 Encounter for immunization: Secondary | ICD-10-CM | POA: Diagnosis not present

## 2016-12-08 DIAGNOSIS — Z0000 Encounter for general adult medical examination without abnormal findings: Secondary | ICD-10-CM | POA: Diagnosis not present

## 2016-12-08 DIAGNOSIS — K219 Gastro-esophageal reflux disease without esophagitis: Secondary | ICD-10-CM | POA: Diagnosis not present

## 2016-12-08 DIAGNOSIS — F5104 Psychophysiologic insomnia: Secondary | ICD-10-CM | POA: Diagnosis not present

## 2016-12-08 DIAGNOSIS — E039 Hypothyroidism, unspecified: Secondary | ICD-10-CM | POA: Diagnosis not present

## 2016-12-08 DIAGNOSIS — M129 Arthropathy, unspecified: Secondary | ICD-10-CM | POA: Diagnosis not present

## 2016-12-08 DIAGNOSIS — E7800 Pure hypercholesterolemia, unspecified: Secondary | ICD-10-CM | POA: Diagnosis not present

## 2017-06-08 DIAGNOSIS — E7800 Pure hypercholesterolemia, unspecified: Secondary | ICD-10-CM | POA: Diagnosis not present

## 2017-06-08 DIAGNOSIS — K219 Gastro-esophageal reflux disease without esophagitis: Secondary | ICD-10-CM | POA: Diagnosis not present

## 2017-06-08 DIAGNOSIS — E039 Hypothyroidism, unspecified: Secondary | ICD-10-CM | POA: Diagnosis not present

## 2017-06-08 DIAGNOSIS — M129 Arthropathy, unspecified: Secondary | ICD-10-CM | POA: Diagnosis not present

## 2017-06-08 DIAGNOSIS — F5104 Psychophysiologic insomnia: Secondary | ICD-10-CM | POA: Diagnosis not present

## 2017-10-09 DIAGNOSIS — H1851 Endothelial corneal dystrophy: Secondary | ICD-10-CM | POA: Diagnosis not present

## 2017-12-19 DIAGNOSIS — E7800 Pure hypercholesterolemia, unspecified: Secondary | ICD-10-CM | POA: Diagnosis not present

## 2017-12-19 DIAGNOSIS — Z0000 Encounter for general adult medical examination without abnormal findings: Secondary | ICD-10-CM | POA: Diagnosis not present

## 2017-12-19 DIAGNOSIS — K219 Gastro-esophageal reflux disease without esophagitis: Secondary | ICD-10-CM | POA: Diagnosis not present

## 2017-12-19 DIAGNOSIS — E039 Hypothyroidism, unspecified: Secondary | ICD-10-CM | POA: Diagnosis not present

## 2017-12-19 DIAGNOSIS — F5104 Psychophysiologic insomnia: Secondary | ICD-10-CM | POA: Diagnosis not present

## 2017-12-19 DIAGNOSIS — Z23 Encounter for immunization: Secondary | ICD-10-CM | POA: Diagnosis not present

## 2017-12-19 DIAGNOSIS — M129 Arthropathy, unspecified: Secondary | ICD-10-CM | POA: Diagnosis not present

## 2018-06-21 DIAGNOSIS — E7800 Pure hypercholesterolemia, unspecified: Secondary | ICD-10-CM | POA: Diagnosis not present

## 2018-06-21 DIAGNOSIS — K219 Gastro-esophageal reflux disease without esophagitis: Secondary | ICD-10-CM | POA: Diagnosis not present

## 2018-06-21 DIAGNOSIS — M129 Arthropathy, unspecified: Secondary | ICD-10-CM | POA: Diagnosis not present

## 2018-06-21 DIAGNOSIS — E039 Hypothyroidism, unspecified: Secondary | ICD-10-CM | POA: Diagnosis not present

## 2018-06-21 DIAGNOSIS — F5104 Psychophysiologic insomnia: Secondary | ICD-10-CM | POA: Diagnosis not present

## 2018-12-10 DIAGNOSIS — B029 Zoster without complications: Secondary | ICD-10-CM | POA: Diagnosis not present

## 2019-01-08 DIAGNOSIS — B029 Zoster without complications: Secondary | ICD-10-CM | POA: Diagnosis not present

## 2019-01-08 DIAGNOSIS — E7800 Pure hypercholesterolemia, unspecified: Secondary | ICD-10-CM | POA: Diagnosis not present

## 2019-01-08 DIAGNOSIS — E039 Hypothyroidism, unspecified: Secondary | ICD-10-CM | POA: Diagnosis not present

## 2019-01-08 DIAGNOSIS — Z0000 Encounter for general adult medical examination without abnormal findings: Secondary | ICD-10-CM | POA: Diagnosis not present

## 2019-01-08 DIAGNOSIS — F5104 Psychophysiologic insomnia: Secondary | ICD-10-CM | POA: Diagnosis not present

## 2019-01-08 DIAGNOSIS — M129 Arthropathy, unspecified: Secondary | ICD-10-CM | POA: Diagnosis not present

## 2019-01-08 DIAGNOSIS — K219 Gastro-esophageal reflux disease without esophagitis: Secondary | ICD-10-CM | POA: Diagnosis not present

## 2019-01-16 DIAGNOSIS — M3501 Sicca syndrome with keratoconjunctivitis: Secondary | ICD-10-CM | POA: Diagnosis not present

## 2019-07-09 DIAGNOSIS — E039 Hypothyroidism, unspecified: Secondary | ICD-10-CM | POA: Diagnosis not present

## 2019-07-09 DIAGNOSIS — E7800 Pure hypercholesterolemia, unspecified: Secondary | ICD-10-CM | POA: Diagnosis not present

## 2019-07-09 DIAGNOSIS — F5104 Psychophysiologic insomnia: Secondary | ICD-10-CM | POA: Diagnosis not present

## 2019-07-09 DIAGNOSIS — K219 Gastro-esophageal reflux disease without esophagitis: Secondary | ICD-10-CM | POA: Diagnosis not present

## 2019-07-09 DIAGNOSIS — Z0000 Encounter for general adult medical examination without abnormal findings: Secondary | ICD-10-CM | POA: Diagnosis not present

## 2019-07-09 DIAGNOSIS — M129 Arthropathy, unspecified: Secondary | ICD-10-CM | POA: Diagnosis not present

## 2019-11-20 DIAGNOSIS — Z23 Encounter for immunization: Secondary | ICD-10-CM | POA: Diagnosis not present

## 2019-11-20 DIAGNOSIS — M25512 Pain in left shoulder: Secondary | ICD-10-CM | POA: Diagnosis not present

## 2020-01-14 DIAGNOSIS — F5104 Psychophysiologic insomnia: Secondary | ICD-10-CM | POA: Diagnosis not present

## 2020-01-14 DIAGNOSIS — K219 Gastro-esophageal reflux disease without esophagitis: Secondary | ICD-10-CM | POA: Diagnosis not present

## 2020-01-14 DIAGNOSIS — J069 Acute upper respiratory infection, unspecified: Secondary | ICD-10-CM | POA: Diagnosis not present

## 2020-01-14 DIAGNOSIS — R7301 Impaired fasting glucose: Secondary | ICD-10-CM | POA: Diagnosis not present

## 2020-01-14 DIAGNOSIS — M129 Arthropathy, unspecified: Secondary | ICD-10-CM | POA: Diagnosis not present

## 2020-01-14 DIAGNOSIS — E7800 Pure hypercholesterolemia, unspecified: Secondary | ICD-10-CM | POA: Diagnosis not present

## 2020-01-14 DIAGNOSIS — E039 Hypothyroidism, unspecified: Secondary | ICD-10-CM | POA: Diagnosis not present

## 2020-01-17 DIAGNOSIS — H35033 Hypertensive retinopathy, bilateral: Secondary | ICD-10-CM | POA: Diagnosis not present

## 2020-07-27 DIAGNOSIS — F5104 Psychophysiologic insomnia: Secondary | ICD-10-CM | POA: Diagnosis not present

## 2020-07-27 DIAGNOSIS — Z0000 Encounter for general adult medical examination without abnormal findings: Secondary | ICD-10-CM | POA: Diagnosis not present

## 2020-07-27 DIAGNOSIS — E7800 Pure hypercholesterolemia, unspecified: Secondary | ICD-10-CM | POA: Diagnosis not present

## 2020-07-27 DIAGNOSIS — K219 Gastro-esophageal reflux disease without esophagitis: Secondary | ICD-10-CM | POA: Diagnosis not present

## 2020-07-27 DIAGNOSIS — M129 Arthropathy, unspecified: Secondary | ICD-10-CM | POA: Diagnosis not present

## 2020-07-27 DIAGNOSIS — E039 Hypothyroidism, unspecified: Secondary | ICD-10-CM | POA: Diagnosis not present

## 2020-07-27 DIAGNOSIS — R7302 Impaired glucose tolerance (oral): Secondary | ICD-10-CM | POA: Diagnosis not present

## 2020-09-03 DIAGNOSIS — D3131 Benign neoplasm of right choroid: Secondary | ICD-10-CM | POA: Diagnosis not present

## 2020-09-03 DIAGNOSIS — H35371 Puckering of macula, right eye: Secondary | ICD-10-CM | POA: Diagnosis not present

## 2020-09-25 DIAGNOSIS — C6931 Malignant neoplasm of right choroid: Secondary | ICD-10-CM | POA: Diagnosis not present

## 2020-10-20 DIAGNOSIS — C6931 Malignant neoplasm of right choroid: Secondary | ICD-10-CM | POA: Diagnosis not present

## 2020-10-20 DIAGNOSIS — Z961 Presence of intraocular lens: Secondary | ICD-10-CM | POA: Diagnosis not present

## 2020-10-21 DIAGNOSIS — C439 Malignant melanoma of skin, unspecified: Secondary | ICD-10-CM | POA: Diagnosis not present

## 2020-10-29 DIAGNOSIS — C439 Malignant melanoma of skin, unspecified: Secondary | ICD-10-CM | POA: Diagnosis not present

## 2020-10-29 DIAGNOSIS — Z510 Encounter for antineoplastic radiation therapy: Secondary | ICD-10-CM | POA: Diagnosis not present

## 2020-10-29 DIAGNOSIS — C6931 Malignant neoplasm of right choroid: Secondary | ICD-10-CM | POA: Diagnosis not present

## 2020-11-03 DIAGNOSIS — C6931 Malignant neoplasm of right choroid: Secondary | ICD-10-CM | POA: Diagnosis not present

## 2020-11-03 DIAGNOSIS — Z961 Presence of intraocular lens: Secondary | ICD-10-CM | POA: Diagnosis not present

## 2020-11-04 ENCOUNTER — Other Ambulatory Visit: Payer: Self-pay | Admitting: Family Medicine

## 2020-11-04 DIAGNOSIS — C439 Malignant melanoma of skin, unspecified: Secondary | ICD-10-CM

## 2020-11-11 ENCOUNTER — Ambulatory Visit
Admission: RE | Admit: 2020-11-11 | Discharge: 2020-11-11 | Disposition: A | Discharge status: Home / Self Care | Payer: PPO | Source: Ambulatory Visit | Attending: Family Medicine | Admitting: Family Medicine

## 2020-11-11 ENCOUNTER — Other Ambulatory Visit: Payer: Self-pay

## 2020-11-11 DIAGNOSIS — K760 Fatty (change of) liver, not elsewhere classified: Secondary | ICD-10-CM | POA: Diagnosis not present

## 2020-11-11 DIAGNOSIS — C439 Malignant melanoma of skin, unspecified: Secondary | ICD-10-CM | POA: Diagnosis not present

## 2020-11-11 MED ORDER — IOHEXOL 300 MG/ML  SOLN
100.0000 mL | Freq: Once | INTRAMUSCULAR | Status: AC | PRN
  Administered 2020-11-11: 100 mL via INTRAVENOUS

## 2020-11-13 DIAGNOSIS — G8929 Other chronic pain: Secondary | ICD-10-CM | POA: Diagnosis not present

## 2020-11-13 DIAGNOSIS — Z79890 Hormone replacement therapy (postmenopausal): Secondary | ICD-10-CM | POA: Diagnosis not present

## 2020-11-13 DIAGNOSIS — Z79899 Other long term (current) drug therapy: Secondary | ICD-10-CM | POA: Diagnosis not present

## 2020-11-13 DIAGNOSIS — Z961 Presence of intraocular lens: Secondary | ICD-10-CM | POA: Diagnosis not present

## 2020-11-13 DIAGNOSIS — E039 Hypothyroidism, unspecified: Secondary | ICD-10-CM | POA: Diagnosis not present

## 2020-11-13 DIAGNOSIS — H3321 Serous retinal detachment, right eye: Secondary | ICD-10-CM | POA: Diagnosis not present

## 2020-11-13 DIAGNOSIS — Z510 Encounter for antineoplastic radiation therapy: Secondary | ICD-10-CM | POA: Diagnosis not present

## 2020-11-13 DIAGNOSIS — M353 Polymyalgia rheumatica: Secondary | ICD-10-CM | POA: Diagnosis not present

## 2020-11-13 DIAGNOSIS — G4700 Insomnia, unspecified: Secondary | ICD-10-CM | POA: Diagnosis not present

## 2020-11-13 DIAGNOSIS — Z90710 Acquired absence of both cervix and uterus: Secondary | ICD-10-CM | POA: Diagnosis not present

## 2020-11-13 DIAGNOSIS — R7303 Prediabetes: Secondary | ICD-10-CM | POA: Diagnosis not present

## 2020-11-13 DIAGNOSIS — K219 Gastro-esophageal reflux disease without esophagitis: Secondary | ICD-10-CM | POA: Diagnosis not present

## 2020-11-13 DIAGNOSIS — I10 Essential (primary) hypertension: Secondary | ICD-10-CM | POA: Diagnosis not present

## 2020-11-13 DIAGNOSIS — Z6829 Body mass index (BMI) 29.0-29.9, adult: Secondary | ICD-10-CM | POA: Diagnosis not present

## 2020-11-13 DIAGNOSIS — H3561 Retinal hemorrhage, right eye: Secondary | ICD-10-CM | POA: Diagnosis not present

## 2020-11-13 DIAGNOSIS — E663 Overweight: Secondary | ICD-10-CM | POA: Diagnosis not present

## 2020-11-13 DIAGNOSIS — C6931 Malignant neoplasm of right choroid: Secondary | ICD-10-CM | POA: Diagnosis not present

## 2020-11-13 DIAGNOSIS — E785 Hyperlipidemia, unspecified: Secondary | ICD-10-CM | POA: Diagnosis not present

## 2020-11-16 DIAGNOSIS — Z510 Encounter for antineoplastic radiation therapy: Secondary | ICD-10-CM | POA: Diagnosis not present

## 2020-11-16 DIAGNOSIS — C6931 Malignant neoplasm of right choroid: Secondary | ICD-10-CM | POA: Diagnosis not present

## 2020-12-01 DIAGNOSIS — Z91040 Latex allergy status: Secondary | ICD-10-CM | POA: Diagnosis not present

## 2020-12-01 DIAGNOSIS — C6931 Malignant neoplasm of right choroid: Secondary | ICD-10-CM | POA: Diagnosis not present

## 2020-12-01 DIAGNOSIS — Z882 Allergy status to sulfonamides status: Secondary | ICD-10-CM | POA: Diagnosis not present

## 2020-12-01 DIAGNOSIS — Z961 Presence of intraocular lens: Secondary | ICD-10-CM | POA: Diagnosis not present

## 2021-01-19 DIAGNOSIS — C6931 Malignant neoplasm of right choroid: Secondary | ICD-10-CM | POA: Diagnosis not present

## 2021-01-19 DIAGNOSIS — Z48810 Encounter for surgical aftercare following surgery on the sense organs: Secondary | ICD-10-CM | POA: Diagnosis not present

## 2021-01-19 DIAGNOSIS — Z961 Presence of intraocular lens: Secondary | ICD-10-CM | POA: Diagnosis not present

## 2021-01-19 DIAGNOSIS — H4311 Vitreous hemorrhage, right eye: Secondary | ICD-10-CM | POA: Diagnosis not present

## 2021-01-27 ENCOUNTER — Other Ambulatory Visit: Payer: Self-pay

## 2021-01-27 DIAGNOSIS — E039 Hypothyroidism, unspecified: Secondary | ICD-10-CM | POA: Diagnosis not present

## 2021-01-27 DIAGNOSIS — F5104 Psychophysiologic insomnia: Secondary | ICD-10-CM | POA: Diagnosis not present

## 2021-01-27 DIAGNOSIS — K219 Gastro-esophageal reflux disease without esophagitis: Secondary | ICD-10-CM | POA: Diagnosis not present

## 2021-01-27 DIAGNOSIS — E7800 Pure hypercholesterolemia, unspecified: Secondary | ICD-10-CM | POA: Diagnosis not present

## 2021-01-27 DIAGNOSIS — C6931 Malignant neoplasm of right choroid: Secondary | ICD-10-CM | POA: Diagnosis not present

## 2021-01-27 DIAGNOSIS — M129 Arthropathy, unspecified: Secondary | ICD-10-CM | POA: Diagnosis not present

## 2021-01-27 DIAGNOSIS — R7302 Impaired glucose tolerance (oral): Secondary | ICD-10-CM | POA: Diagnosis not present

## 2021-01-27 NOTE — Progress Notes (Deleted)
Gastroenterology Pre-Procedure Review  Request Date: *** Requesting Physician: Dr. Marland Kitchen  PATIENT REVIEW QUESTIONS: The patient responded to the following health history questions as indicated:    1. Are you having any GI issues? {Yes/No:19989} 2. Do you have a personal history of Polyps? {Yes/No:19989} 3. Do you have a family history of Colon Cancer or Polyps? {Yes/No:19989} 4. Diabetes Mellitus? {Yes/No:19989} 5. Joint replacements in the past 12 months?{Yes/No:19989} 6. Major health problems in the past 3 months?{Yes/No:19989} 7. Any artificial heart valves, MVP, or defibrillator?{Yes/No:19989}    MEDICATIONS & ALLERGIES:    Patient reports the following regarding taking any anticoagulation/antiplatelet therapy:   Plavix, Coumadin, Eliquis, Xarelto, Lovenox, Pradaxa, Brilinta, or Effient? {Yes/No:19989} Aspirin? {Yes/No:19989}  Patient confirms/reports the following medications:  Current Outpatient Medications  Medication Sig Dispense Refill   acetaminophen (TYLENOL) 650 MG CR tablet Take 1,300 mg by mouth.     Calcium Carbonate-Vitamin D 600-400 MG-UNIT tablet Take by mouth.     celecoxib (CELEBREX) 200 MG capsule Take 200 mg by mouth.     Cyanocobalamin (RA VITAMIN B-12 TR) 1000 MCG TBCR Take by mouth.     Dexlansoprazole (DEXILANT PO) Take by mouth daily.     diphenhydrAMINE (BENADRYL) 25 mg capsule Take by mouth.     docusate sodium (COLACE) 100 MG capsule Take 100 mg by mouth.     fluticasone (FLONASE) 50 MCG/ACT nasal spray Place into the nose.     levothyroxine (SYNTHROID, LEVOTHROID) 50 MCG tablet TAKE 1 TABLET (50 MCG TOTAL) BY MOUTH ONCE DAILY.     levothyroxine (SYNTHROID, LEVOTHROID) 50 MCG tablet      Omega-3 Fatty Acids (FISH OIL PO) Take by mouth.     pravastatin (PRAVACHOL) 20 MG tablet Take by mouth.     sodium chloride (OCEAN) 0.65 % nasal spray Place into the nose.     No current facility-administered medications for this visit.    Patient confirms/reports  the following allergies:  Allergies  Allergen Reactions   Latex Rash    From Bandaids   Sulfa Antibiotics Nausea Only    No orders of the defined types were placed in this encounter.   AUTHORIZATION INFORMATION Primary Insurance: 1D#: Group #:  Secondary Insurance: 1D#: Group #:  SCHEDULE INFORMATION: Date:  Time: Location:

## 2021-01-27 NOTE — Progress Notes (Signed)
Had to bring patient in tried to schedule colonoscopy she stated she is having bad cramps and constipation so made an appointment

## 2021-01-28 ENCOUNTER — Ambulatory Visit (INDEPENDENT_AMBULATORY_CARE_PROVIDER_SITE_OTHER): Payer: PPO | Admitting: Gastroenterology

## 2021-01-28 ENCOUNTER — Encounter: Payer: Self-pay | Admitting: Gastroenterology

## 2021-01-28 VITALS — BP 138/86 | HR 69 | Temp 98.3°F | Ht 65.0 in | Wt 174.0 lb

## 2021-01-28 DIAGNOSIS — R194 Change in bowel habit: Secondary | ICD-10-CM

## 2021-01-28 MED ORDER — NA SULFATE-K SULFATE-MG SULF 17.5-3.13-1.6 GM/177ML PO SOLN: 1.0000 | Freq: Once | ORAL | 0 refills | Status: AC

## 2021-01-28 NOTE — H&P (View-Only) (Signed)
Gastroenterology Consultation  Referring Provider:     Sofie Hartigan, MD Primary Care Physician:  Sofie Hartigan, MD Primary Gastroenterologist:  Dr. Allen Norris     Reason for Consultation:     Constipation        HPI:   Christine Savage is a 76 y.o. y/o female referred for consultation & management of constipation by Dr. Ellison Hughs, Chrissie Noa, MD. This patient comes in today with a history of having a colonoscopy by me back in 2017 and at that time was recommended to have a repeat colonoscopy in 5 years due to the patient's finding of an adenomatous polyp.  The patient was seen by her primary care provider yesterday and was being stage IV malignant melanoma.  The patient had contacted our office because of "bad constipation".   She reports that the constipation has been off and on but worse after her eye surgery for the melanoma.  No weight loss, black stools or bloody stools. She is taking OTC laxatives and says that the Miralax did not help. She has a hard bowel movement every three days.  Prior to having the GI surgery she states her bowels were normal.  There is no report of any unexplained weight loss fevers chills nausea or vomiting.  Her surgery of her eye was for implantation of pellets that she reports will eradicate her melanoma.  Past Medical History:  Diagnosis Date   Arthritis    GERD (gastroesophageal reflux disease)    Hyperlipidemia    Hypothyroidism    Insomnia     Past Surgical History:  Procedure Laterality Date   ABDOMINAL HYSTERECTOMY     CATARACT EXTRACTION W/ INTRAOCULAR LENS  IMPLANT, BILATERAL     COLONOSCOPY WITH PROPOFOL N/A 12/17/2015   Procedure: COLONOSCOPY WITH PROPOFOL;  Surgeon: Lucilla Lame, MD;  Location: [facility];  Service: Endoscopy;  Laterality: N/A;   ESOPHAGOGASTRODUODENOSCOPY (EGD) WITH PROPOFOL N/A 12/17/2015   Procedure: ESOPHAGOGASTRODUODENOSCOPY (EGD) WITH PROPOFOL;  Surgeon: Lucilla Lame, MD;  Location: [facility];   Service: Endoscopy;  Laterality: N/A;  Latex sensitivity   POLYPECTOMY N/A 12/17/2015   Procedure: POLYPECTOMY;  Surgeon: Lucilla Lame, MD;  Location: [facility];  Service: Endoscopy;  Laterality: N/A;   ROOT CANAL     TUBAL LIGATION      Prior to Admission medications   Medication Sig Start Date End Date Taking? Authorizing Provider  acetaminophen (TYLENOL) 650 MG CR tablet Take 1,300 mg by mouth.    [provider]  Calcium Carbonate-Vitamin D 600-400 MG-UNIT tablet Take by mouth.    [provider]  celecoxib (CELEBREX) 200 MG capsule Take 200 mg by mouth.    [provider]  Cyanocobalamin (RA VITAMIN B-12 TR) 1000 MCG TBCR Take by mouth.    [provider]  Dexlansoprazole (DEXILANT PO) Take by mouth daily.    [provider]  diphenhydrAMINE (BENADRYL) 25 mg capsule Take by mouth.    [provider]  docusate sodium (COLACE) 100 MG capsule Take 100 mg by mouth.    [provider]  fluticasone (FLONASE) 50 MCG/ACT nasal spray Place into the nose. 06/05/12   [provider]  levothyroxine (SYNTHROID, LEVOTHROID) 50 MCG tablet TAKE 1 TABLET (50 MCG TOTAL) BY MOUTH ONCE DAILY. 06/29/15   [provider]  levothyroxine (SYNTHROID, LEVOTHROID) 50 MCG tablet  09/27/15   [provider]  Omega-3 Fatty Acids (FISH OIL PO) Take by mouth.    [provider]  pravastatin (PRAVACHOL) 20 MG tablet Take by mouth. 06/02/15 01/27/21  [provider]  sodium chloride (OCEAN) 0.65 % nasal spray Place into the nose.    [provider]    Family History  Problem Relation Age of Onset   Coronary artery disease Mother    Cancer Father    Coronary artery disease Father    Osteoarthritis Brother    Cancer Brother      Social History   Tobacco Use   Smoking status: Never   Smokeless tobacco: Never  Substance Use Topics   Alcohol use: No   Drug use: No    Allergies as of  01/28/2021 - Review Complete 01/27/2021  Allergen Reaction Noted   Latex Rash 02/17/2015   Sulfa antibiotics Nausea Only 12/16/2014    Review of Systems:    All systems reviewed and negative except where noted in HPI.   Physical Exam:  There were no vitals taken for this visit. No LMP recorded. Patient is postmenopausal. General:   Alert,  Well-developed, well-nourished, pleasant and cooperative in NAD Head:  Normocephalic and atraumatic. Eyes:  Sclera clear, no icterus.   Conjunctiva pink. Ears:  Normal auditory acuity. Neck:  Supple; no masses or thyromegaly. Lungs:  Respirations even and unlabored.  Clear throughout to auscultation.   No wheezes, crackles, or rhonchi. No acute distress. Heart:  Regular rate and rhythm; no murmurs, clicks, rubs, or gallops. Abdomen:  Normal bowel sounds.  No bruits.  Soft, non-tender and non-distended without masses, hepatosplenomegaly or hernias noted.  No guarding or rebound tenderness.  Negative Carnett sign.   Rectal:  Deferred.  Pulses:  Normal pulses noted. Extremities:  No clubbing or edema.  No cyanosis. Neurologic:  Alert and oriented x3;  grossly normal neurologically. Skin:  Intact without significant lesions or rashes.  No jaundice. Lymph Nodes:  No significant cervical adenopathy. Psych:  Alert and cooperative. Normal mood and affect.  Imaging Studies: No results found.  Assessment and Plan:   Christine Savage is a 76 y.o. y/o female who comes in today with a history of change in bowel habits with constipation.  The patient has a history of adenomas and will be set up for a colonoscopy to rule out any cause for her change in bowel habits and for her history of adenomatous colon polyps.  The patient has been explained the plan and agrees with it.    Lucilla Lame, MD. Marval Regal    Note: This dictation was prepared with Dragon dictation along with smaller phrase technology. Any transcriptional errors that result from this process are  unintentional.

## 2021-01-28 NOTE — Progress Notes (Signed)
Gastroenterology Consultation  Referring Provider:     Sofie Hartigan, MD Primary Care Physician:  Sofie Hartigan, MD Primary Gastroenterologist:  Dr. Allen Norris     Reason for Consultation:     Constipation        HPI:   Christine Savage is a 76 y.o. y/o female referred for consultation & management of constipation by Dr. Ellison Hughs, Chrissie Noa, MD. This patient comes in today with a history of having a colonoscopy by me back in 2017 and at that time was recommended to have a repeat colonoscopy in 5 years due to the patient's finding of an adenomatous polyp.  The patient was seen by her primary care provider yesterday and was being stage IV malignant melanoma.  The patient had contacted our office because of "bad constipation".   She reports that the constipation has been off and on but worse after her eye surgery for the melanoma.  No weight loss, black stools or bloody stools. She is taking OTC laxatives and says that the Miralax did not help. She has a hard bowel movement every three days.  Prior to having the GI surgery she states her bowels were normal.  There is no report of any unexplained weight loss fevers chills nausea or vomiting.  Her surgery of her eye was for implantation of pellets that she reports will eradicate her melanoma.  Past Medical History:  Diagnosis Date   Arthritis    GERD (gastroesophageal reflux disease)    Hyperlipidemia    Hypothyroidism    Insomnia     Past Surgical History:  Procedure Laterality Date   ABDOMINAL HYSTERECTOMY     CATARACT EXTRACTION W/ INTRAOCULAR LENS  IMPLANT, BILATERAL     COLONOSCOPY WITH PROPOFOL N/A 12/17/2015   Procedure: COLONOSCOPY WITH PROPOFOL;  Surgeon: Lucilla Lame, MD;  Location: [facility];  Service: Endoscopy;  Laterality: N/A;   ESOPHAGOGASTRODUODENOSCOPY (EGD) WITH PROPOFOL N/A 12/17/2015   Procedure: ESOPHAGOGASTRODUODENOSCOPY (EGD) WITH PROPOFOL;  Surgeon: Lucilla Lame, MD;  Location: [facility];   Service: Endoscopy;  Laterality: N/A;  Latex sensitivity   POLYPECTOMY N/A 12/17/2015   Procedure: POLYPECTOMY;  Surgeon: Lucilla Lame, MD;  Location: [facility];  Service: Endoscopy;  Laterality: N/A;   ROOT CANAL     TUBAL LIGATION      Prior to Admission medications   Medication Sig Start Date End Date Taking? Authorizing Provider  acetaminophen (TYLENOL) 650 MG CR tablet Take 1,300 mg by mouth.    [provider]  Calcium Carbonate-Vitamin D 600-400 MG-UNIT tablet Take by mouth.    [provider]  celecoxib (CELEBREX) 200 MG capsule Take 200 mg by mouth.    [provider]  Cyanocobalamin (RA VITAMIN B-12 TR) 1000 MCG TBCR Take by mouth.    [provider]  Dexlansoprazole (DEXILANT PO) Take by mouth daily.    [provider]  diphenhydrAMINE (BENADRYL) 25 mg capsule Take by mouth.    [provider]  docusate sodium (COLACE) 100 MG capsule Take 100 mg by mouth.    [provider]  fluticasone (FLONASE) 50 MCG/ACT nasal spray Place into the nose. 06/05/12   [provider]  levothyroxine (SYNTHROID, LEVOTHROID) 50 MCG tablet TAKE 1 TABLET (50 MCG TOTAL) BY MOUTH ONCE DAILY. 06/29/15   [provider]  levothyroxine (SYNTHROID, LEVOTHROID) 50 MCG tablet  09/27/15   [provider]  Omega-3 Fatty Acids (FISH OIL PO) Take by mouth.    [provider]  pravastatin (PRAVACHOL) 20 MG tablet Take by mouth. 06/02/15 01/27/21  [provider]  sodium chloride (OCEAN) 0.65 % nasal spray Place into the nose.    [provider]    Family History  Problem Relation Age of Onset   Coronary artery disease Mother    Cancer Father    Coronary artery disease Father    Osteoarthritis Brother    Cancer Brother      Social History   Tobacco Use   Smoking status: Never   Smokeless tobacco: Never  Substance Use Topics   Alcohol use: No   Drug use: No    Allergies as of  01/28/2021 - Review Complete 01/27/2021  Allergen Reaction Noted   Latex Rash 02/17/2015   Sulfa antibiotics Nausea Only 12/16/2014    Review of Systems:    All systems reviewed and negative except where noted in HPI.   Physical Exam:  There were no vitals taken for this visit. No LMP recorded. Patient is postmenopausal. General:   Alert,  Well-developed, well-nourished, pleasant and cooperative in NAD Head:  Normocephalic and atraumatic. Eyes:  Sclera clear, no icterus.   Conjunctiva pink. Ears:  Normal auditory acuity. Neck:  Supple; no masses or thyromegaly. Lungs:  Respirations even and unlabored.  Clear throughout to auscultation.   No wheezes, crackles, or rhonchi. No acute distress. Heart:  Regular rate and rhythm; no murmurs, clicks, rubs, or gallops. Abdomen:  Normal bowel sounds.  No bruits.  Soft, non-tender and non-distended without masses, hepatosplenomegaly or hernias noted.  No guarding or rebound tenderness.  Negative Carnett sign.   Rectal:  Deferred.  Pulses:  Normal pulses noted. Extremities:  No clubbing or edema.  No cyanosis. Neurologic:  Alert and oriented x3;  grossly normal neurologically. Skin:  Intact without significant lesions or rashes.  No jaundice. Lymph Nodes:  No significant cervical adenopathy. Psych:  Alert and cooperative. Normal mood and affect.  Imaging Studies: No results found.  Assessment and Plan:   Christine Savage is a 76 y.o. y/o female who comes in today with a history of change in bowel habits with constipation.  The patient has a history of adenomas and will be set up for a colonoscopy to rule out any cause for her change in bowel habits and for her history of adenomatous colon polyps.  The patient has been explained the plan and agrees with it.    Lucilla Lame, MD. Marval Regal    Note: This dictation was prepared with Dragon dictation along with smaller phrase technology. Any transcriptional errors that result from this process are  unintentional.

## 2021-02-01 DIAGNOSIS — J069 Acute upper respiratory infection, unspecified: Secondary | ICD-10-CM | POA: Diagnosis not present

## 2021-02-01 DIAGNOSIS — R0989 Other specified symptoms and signs involving the circulatory and respiratory systems: Secondary | ICD-10-CM | POA: Diagnosis not present

## 2021-02-22 ENCOUNTER — Encounter: Payer: Self-pay | Admitting: Gastroenterology

## 2021-02-23 ENCOUNTER — Other Ambulatory Visit: Payer: Self-pay

## 2021-02-23 ENCOUNTER — Encounter
Admission: RE | Disposition: A | Discharge status: Home / Self Care | Payer: Self-pay | Source: Home / Self Care | Attending: Gastroenterology

## 2021-02-23 ENCOUNTER — Ambulatory Visit
Admission: RE | Admit: 2021-02-23 | Discharge: 2021-02-23 | Disposition: A | Discharge status: Home / Self Care | Payer: PPO | Attending: Gastroenterology | Admitting: Gastroenterology

## 2021-02-23 ENCOUNTER — Ambulatory Visit: Payer: PPO | Admitting: Anesthesiology

## 2021-02-23 ENCOUNTER — Encounter: Payer: Self-pay | Admitting: Gastroenterology

## 2021-02-23 DIAGNOSIS — D123 Benign neoplasm of transverse colon: Secondary | ICD-10-CM | POA: Insufficient documentation

## 2021-02-23 DIAGNOSIS — K219 Gastro-esophageal reflux disease without esophagitis: Secondary | ICD-10-CM | POA: Insufficient documentation

## 2021-02-23 DIAGNOSIS — M1990 Unspecified osteoarthritis, unspecified site: Secondary | ICD-10-CM | POA: Diagnosis not present

## 2021-02-23 DIAGNOSIS — E039 Hypothyroidism, unspecified: Secondary | ICD-10-CM | POA: Diagnosis not present

## 2021-02-23 DIAGNOSIS — K5900 Constipation, unspecified: Secondary | ICD-10-CM | POA: Diagnosis not present

## 2021-02-23 DIAGNOSIS — K635 Polyp of colon: Secondary | ICD-10-CM | POA: Diagnosis not present

## 2021-02-23 DIAGNOSIS — K640 First degree hemorrhoids: Secondary | ICD-10-CM | POA: Insufficient documentation

## 2021-02-23 DIAGNOSIS — Z1211 Encounter for screening for malignant neoplasm of colon: Secondary | ICD-10-CM | POA: Diagnosis not present

## 2021-02-23 DIAGNOSIS — R194 Change in bowel habit: Secondary | ICD-10-CM | POA: Diagnosis not present

## 2021-02-23 DIAGNOSIS — E7800 Pure hypercholesterolemia, unspecified: Secondary | ICD-10-CM | POA: Diagnosis not present

## 2021-02-23 HISTORY — DX: Malignant neoplasm of unspecified site of unspecified eye: C69.90

## 2021-02-23 HISTORY — PX: COLONOSCOPY WITH PROPOFOL: SHX5780

## 2021-02-23 SURGERY — COLONOSCOPY WITH PROPOFOL
Anesthesia: General

## 2021-02-23 MED ORDER — SODIUM CHLORIDE 0.9 % IV SOLN: INTRAVENOUS | Status: DC

## 2021-02-23 MED ORDER — SUCCINYLCHOLINE CHLORIDE 200 MG/10ML IV SOSY
PREFILLED_SYRINGE | INTRAVENOUS | Status: AC
  Filled 2021-02-23: qty 10

## 2021-02-23 MED ORDER — SPOT INK MARKER SYRINGE KIT
PACK | SUBMUCOSAL | Status: DC | PRN
  Administered 2021-02-23: 5 mL via SUBMUCOSAL

## 2021-02-23 MED ORDER — PROPOFOL 500 MG/50ML IV EMUL
INTRAVENOUS | Status: DC | PRN
  Administered 2021-02-23: 150 ug/kg/min via INTRAVENOUS

## 2021-02-23 MED ORDER — ROCURONIUM BROMIDE 10 MG/ML (PF) SYRINGE
PREFILLED_SYRINGE | INTRAVENOUS | Status: AC
  Filled 2021-02-23: qty 10

## 2021-02-23 MED ORDER — LIDOCAINE HCL (CARDIAC) PF 100 MG/5ML IV SOSY
PREFILLED_SYRINGE | INTRAVENOUS | Status: DC | PRN
  Administered 2021-02-23: 100 mg via INTRAVENOUS

## 2021-02-23 MED ORDER — PROPOFOL 10 MG/ML IV BOLUS
INTRAVENOUS | Status: AC
  Filled 2021-02-23: qty 20

## 2021-02-23 MED ORDER — PROPOFOL 10 MG/ML IV BOLUS
INTRAVENOUS | Status: DC | PRN
Start: 2021-02-23 — End: 2021-02-23
  Administered 2021-02-23: 70 mg via INTRAVENOUS

## 2021-02-23 MED ORDER — ONDANSETRON HCL 4 MG/2ML IJ SOLN
INTRAMUSCULAR | Status: AC
  Filled 2021-02-23: qty 2

## 2021-02-23 MED ORDER — DEXAMETHASONE SODIUM PHOSPHATE 10 MG/ML IJ SOLN
INTRAMUSCULAR | Status: AC
  Filled 2021-02-23: qty 1

## 2021-02-23 NOTE — Anesthesia Preprocedure Evaluation (Addendum)
Anesthesia Evaluation  Patient identified by MRN, date of birth, ID band Patient awake    Reviewed: Allergy & Precautions, NPO status , Patient's Chart, lab work & pertinent test results  Airway Mallampati: III  TM Distance: >3 FB Neck ROM: full    Dental no notable dental hx.    Pulmonary neg pulmonary ROS,    Pulmonary exam normal        Cardiovascular negative cardio ROS Normal cardiovascular exam     Neuro/Psych negative neurological ROS  negative psych ROS   GI/Hepatic Neg liver ROS, GERD  Controlled and Medicated,  Endo/Other  Hypothyroidism   Renal/GU negative Renal ROS  negative genitourinary   Musculoskeletal  (+) Arthritis ,   Abdominal   Peds  Hematology negative hematology ROS (+)   Anesthesia Other Findings Past Medical History: No date: Arthritis No date: GERD (gastroesophageal reflux disease) No date: Hyperlipidemia No date: Hypothyroidism No date: Insomnia  Past Surgical History: No date: ABDOMINAL HYSTERECTOMY No date: CATARACT EXTRACTION W/ INTRAOCULAR LENS  IMPLANT, BILATERAL 12/17/2015: COLONOSCOPY WITH PROPOFOL; N/A     Comment:  Procedure: COLONOSCOPY WITH PROPOFOL;  Surgeon: Lucilla Lame, MD;  Location: [facility];  Service:               Endoscopy;  Laterality: N/A; 12/17/2015: ESOPHAGOGASTRODUODENOSCOPY (EGD) WITH PROPOFOL; N/A     Comment:  Procedure: ESOPHAGOGASTRODUODENOSCOPY (EGD) WITH               PROPOFOL;  Surgeon: Lucilla Lame, MD;  Location: [facility];  Service: Endoscopy;  Laterality: N/A;                Latex sensitivity No date: EYE SURGERY 12/17/2015: POLYPECTOMY; N/A     Comment:  Procedure: POLYPECTOMY;  Surgeon: Lucilla Lame, MD;                Location: [facility];  Service: Endoscopy;                Laterality: N/A; No date: ROOT CANAL No date: TUBAL LIGATION     Reproductive/Obstetrics negative OB  ROS                            Anesthesia Physical Anesthesia Plan  ASA: 2  Anesthesia Plan: General   Post-op Pain Management:    Induction: Intravenous  PONV Risk Score and Plan: Propofol infusion and TIVA  Airway Management Planned: Natural Airway and Nasal Cannula  Additional Equipment:   Intra-op Plan:   Post-operative Plan:   Informed Consent: I have reviewed the patients History and Physical, chart, labs and discussed the procedure including the risks, benefits and alternatives for the proposed anesthesia with the patient or authorized representative who has indicated his/her understanding and acceptance.     Dental advisory given  Plan Discussed with: Anesthesiologist and CRNA  Anesthesia Plan Comments:        Anesthesia Quick Evaluation

## 2021-02-23 NOTE — Anesthesia Postprocedure Evaluation (Signed)
Anesthesia Post Note  Patient: Christine Savage  Procedure(s) Performed: COLONOSCOPY WITH PROPOFOL  Patient location during evaluation: Endoscopy Anesthesia Type: General Level of consciousness: combative Pain management: pain level controlled Vital Signs Assessment: post-procedure vital signs reviewed and stable Respiratory status: spontaneous breathing, nonlabored ventilation and respiratory function stable Cardiovascular status: blood pressure returned to baseline and stable Postop Assessment: no apparent nausea or vomiting Anesthetic complications: no   No notable events documented.   Last Vitals:  Vitals:   02/23/21 1030 02/23/21 1040  BP: (!) 154/77 134/77  Pulse:  67  Resp:  16  Temp:    SpO2:  100%    Last Pain:  Vitals:   02/23/21 1000  TempSrc: Temporal  PainSc:                  [place] Ouch

## 2021-02-23 NOTE — Interval H&P Note (Signed)
Lucilla Lame, MD [facility] [address] [place], [place] [address] Phone:[phone] Fax : [phone]  Primary Care Physician:  Sofie Hartigan, MD Primary Gastroenterologist:  Dr. Allen Norris  Pre-Procedure History & Physical: HPI:  Christine Savage is a 76 y.o. female is here for an colonoscopy.   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[place])    GERD (gastroesophageal reflux disease)    Hyperlipidemia    Hypothyroidism    Insomnia     Past Surgical History:  Procedure Laterality Date   ABDOMINAL HYSTERECTOMY     CATARACT EXTRACTION W/ INTRAOCULAR LENS  IMPLANT, BILATERAL     COLONOSCOPY WITH PROPOFOL N/A 12/17/2015   Procedure: COLONOSCOPY WITH PROPOFOL;  Surgeon: Lucilla Lame, MD;  Location: [facility];  Service: Endoscopy;  Laterality: N/A;   ESOPHAGOGASTRODUODENOSCOPY (EGD) WITH PROPOFOL N/A 12/17/2015   Procedure: ESOPHAGOGASTRODUODENOSCOPY (EGD) WITH PROPOFOL;  Surgeon: Lucilla Lame, MD;  Location: [facility];  Service: Endoscopy;  Laterality: N/A;  Latex sensitivity   EYE SURGERY     NASAL SINUS SURGERY     POLYPECTOMY N/A 12/17/2015   Procedure: POLYPECTOMY;  Surgeon: Lucilla Lame, MD;  Location: [facility];  Service: Endoscopy;  Laterality: N/A;   ROOT CANAL     TUBAL LIGATION      Prior to Admission medications   Medication Sig Start Date End Date Taking? Authorizing Provider  acetaminophen (TYLENOL) 650 MG CR tablet Take 1,300 mg by mouth.   Yes [provider]  Calcium Carbonate-Vitamin D 600-400 MG-UNIT tablet Take by mouth.   Yes [provider]  Cyanocobalamin 1000 MCG TBCR Take by mouth.   Yes [provider]  Dexlansoprazole (DEXILANT PO) Take by mouth daily.   Yes [provider]  diphenhydrAMINE (BENADRYL) 25 mg capsule Take by mouth.   Yes [provider]  docusate sodium (COLACE) 100 MG capsule Take 100 mg by mouth.   Yes [provider]   levothyroxine (SYNTHROID, LEVOTHROID) 50 MCG tablet TAKE 1 TABLET (50 MCG TOTAL) BY MOUTH ONCE DAILY. 06/29/15  Yes [provider]  Omega-3 Fatty Acids (FISH OIL PO) Take by mouth.   Yes [provider]  pantoprazole (PROTONIX) 40 MG tablet Take 40 mg by mouth 2 (two) times daily. 12/20/20  Yes [provider]  pravastatin (PRAVACHOL) 20 MG tablet Take by mouth. 06/02/15 02/23/21 Yes [provider]  sodium chloride (OCEAN) 0.65 % nasal spray Place into the nose.   Yes [provider]    Allergies as of 01/28/2021 - Review Complete 01/28/2021  Allergen Reaction Noted   Latex Rash 02/17/2015   Sulfa antibiotics Nausea Only 12/16/2014    Family History  Problem Relation Age of Onset   Coronary artery disease Mother    Cancer Father    Coronary artery disease Father    Osteoarthritis Brother    Cancer Brother     Social History   Socioeconomic History   Marital status: Married    Spouse name: Not on file   Number of children: Not on file   Years of education: Not on file   Highest education level: Not on file  Occupational History   Not on file  Tobacco Use   Smoking status: Never   Smokeless tobacco: Never  Vaping Use   Vaping Use: Never used  Substance and Sexual Activity   Alcohol use: No   Drug use: No   Sexual activity: Not on  file  Other Topics Concern   Not on file  Social History Narrative   Not on file   Social Determinants of Health   Financial Resource Strain: Not on file  Food Insecurity: Not on file  Transportation Needs: Not on file  Physical Activity: Not on file  Stress: Not on file  Social Connections: Not on file  Intimate Partner Violence: Not on file    Review of Systems: See HPI, otherwise negative ROS  Physical Exam: BP (!) 170/91    Pulse 77    Temp 97.9 F (36.6 C) (Temporal)    Resp 20    Ht 5\' 5"  (1.651 m)    Wt 78 kg    SpO2 99%    BMI 28.62 kg/m  General:   Alert,  pleasant and  cooperative in NAD Head:  Normocephalic and atraumatic. Neck:  Supple; no masses or thyromegaly. Lungs:  Clear throughout to auscultation.    Heart:  Regular rate and rhythm. Abdomen:  Soft, nontender and nondistended. Normal bowel sounds, without guarding, and without rebound.   Neurologic:  Alert and  oriented x4;  grossly normal neurologically.  Impression/Plan: Christine Savage is here for an colonoscopy to be performed for constipation  Risks, benefits, limitations, and alternatives regarding  colonoscopy have been reviewed with the patient.  Questions have been answered.  All parties agreeable.   Lucilla Lame, MD  02/23/2021, 9:17 AM

## 2021-02-23 NOTE — Transfer of Care (Signed)
Immediate Anesthesia Transfer of Care Note  Patient: Christine Savage  Procedure(s) Performed: COLONOSCOPY WITH PROPOFOL  Patient Location: Endoscopy Unit  Anesthesia Type:General  Level of Consciousness: drowsy  Airway & Oxygen Therapy: Patient Spontanous Breathing  Post-op Assessment: Report given to RN and Post -op Vital signs reviewed and stable  Post vital signs: Reviewed and stable  Last Vitals:  Vitals Value Taken Time  BP    Temp    Pulse 66 02/23/21 1003  Resp 12 02/23/21 1003  SpO2 95 % 02/23/21 1003  Vitals shown include unvalidated device data.  Last Pain:  Vitals:   02/23/21 0841  TempSrc: Temporal  PainSc: 0-No pain         Complications: No notable events documented.

## 2021-02-23 NOTE — Op Note (Signed)
[facility] Gastroenterology Patient Name: Leonilda Cozby Procedure Date: 02/23/2021 9:24 AM MRN: [id number] Account #: [id number] Date of Birth: [birth year] Admit Type: Outpatient Age: 76 Room: [facility] ENDO ROOM 4 Gender: Female Note Status: Finalized Instrument Name: Park Meo [id number] Procedure:             Colonoscopy Indications:           High risk colon cancer surveillance: Personal history                         of colonic polyps Providers:             Lucilla Lame MD, MD Referring MD:          Sofie Hartigan (Referring MD) Medicines:             Propofol per Anesthesia Complications:         No immediate complications. Procedure:             Pre-Anesthesia Assessment:                        - Prior to the procedure, a History and Physical was                         performed, and patient medications and allergies were                         reviewed. The patient's tolerance of previous                         anesthesia was also reviewed. The risks and benefits                         of the procedure and the sedation options and risks                         were discussed with the patient. All questions were                         answered, and informed consent was obtained. Prior                         Anticoagulants: The patient has taken no previous                         anticoagulant or antiplatelet agents. ASA Grade                         Assessment: II - A patient with mild systemic disease.                         After reviewing the risks and benefits, the patient                         was deemed in satisfactory condition to undergo the                         procedure.  After obtaining informed consent, the colonoscope was                         passed under direct vision. Throughout the procedure,                         the patient's blood pressure, pulse, and oxygen                         saturations were  monitored continuously. The                         Colonoscope was introduced through the anus and                         advanced to the the cecum, identified by appendiceal                         orifice and ileocecal valve. The colonoscopy was                         performed without difficulty. The patient tolerated                         the procedure well. The quality of the bowel                         preparation was excellent. Findings:      The perianal and digital rectal examinations were normal.      A 20 mm polyp was found in the proximal transverse colon. The polyp was       sessile. Preparations were made for mucosal resection. Chromoscopy with       indigo carmine was done to mark the borders of the lesion. Saline with       indigo carmine was injected to raise the lesion. Snare mucosal resection       was performed. Resection and retrieval were complete. To close a defect       after polypectomy, three hemostatic clips were successfully placed (MR       conditional). There was no bleeding at the end of the procedure. Area       was tattooed with an injection of [place] ink.      Non-bleeding internal hemorrhoids were found during retroflexion. The       hemorrhoids were Grade I (internal hemorrhoids that do not prolapse). Impression:            - One 20 mm polyp in the proximal transverse colon,                         removed with mucosal resection. Resected and                         retrieved. Clips (MR conditional) were placed.                         Tattooed.                        - Non-bleeding internal hemorrhoids.                        -  Mucosal resection was performed. Resection and                         retrieval were complete. Recommendation:        - Discharge patient to home.                        - Resume previous diet.                        - Continue present medications.                        - Await pathology results.                         - Repeat colonoscopy in 6 months for surveillance. Procedure Code(s):     --- Professional ---                        [phone], Colonoscopy, flexible; with endoscopic mucosal                         resection Diagnosis Code(s):     --- Professional ---                        Z86.010, Personal history of colonic polyps                        K63.5, Polyp of colon CPT copyright 2019 American Medical Association. All rights reserved. The codes documented in this report are preliminary and upon coder review may  be revised to meet current compliance requirements. Lucilla Lame MD, MD 02/23/2021 10:05:06 AM This report has been signed electronically. Number of Addenda: 0 Note Initiated On: 02/23/2021 9:24 AM Scope Withdrawal Time: 0 hours 5 minutes 27 seconds  Total Procedure Duration: 0 hours 28 minutes 46 seconds  Estimated Blood Loss:  Estimated blood loss: none.      [facility]

## 2021-02-24 ENCOUNTER — Encounter: Payer: Self-pay | Admitting: Gastroenterology

## 2021-02-24 LAB — SURGICAL PATHOLOGY

## 2021-03-04 ENCOUNTER — Telehealth: Payer: Self-pay

## 2021-03-04 NOTE — Telephone Encounter (Signed)
Patient left a voicemail and states she is calling about results and would like a call back

## 2021-03-17 DIAGNOSIS — C6931 Malignant neoplasm of right choroid: Secondary | ICD-10-CM | POA: Diagnosis not present

## 2021-03-17 DIAGNOSIS — Z961 Presence of intraocular lens: Secondary | ICD-10-CM | POA: Diagnosis not present

## 2021-03-17 DIAGNOSIS — H4311 Vitreous hemorrhage, right eye: Secondary | ICD-10-CM | POA: Diagnosis not present

## 2021-03-17 DIAGNOSIS — H3561 Retinal hemorrhage, right eye: Secondary | ICD-10-CM | POA: Diagnosis not present

## 2021-06-08 ENCOUNTER — Telehealth: Payer: Self-pay | Admitting: Gastroenterology

## 2021-06-08 NOTE — Telephone Encounter (Signed)
Pt left message complaining that she is having constant diarrhea has been going on for a few days would like a call back

## 2021-06-11 NOTE — Telephone Encounter (Signed)
Pt is due for repeat colonoscopy. Would you like stool labs due to diarrhea?

## 2021-06-14 ENCOUNTER — Other Ambulatory Visit: Payer: Self-pay

## 2021-06-14 DIAGNOSIS — Z86010 Personal history of colonic polyps: Secondary | ICD-10-CM

## 2021-06-14 NOTE — Telephone Encounter (Signed)
Pt reports diarrhea has resolved and denies any other Sx.... colonoscopy scheduled for July 11th

## 2021-07-05 DIAGNOSIS — M79672 Pain in left foot: Secondary | ICD-10-CM | POA: Diagnosis not present

## 2021-07-05 DIAGNOSIS — M25572 Pain in left ankle and joints of left foot: Secondary | ICD-10-CM | POA: Diagnosis not present

## 2021-07-05 DIAGNOSIS — M25475 Effusion, left foot: Secondary | ICD-10-CM | POA: Diagnosis not present

## 2021-07-05 DIAGNOSIS — M7989 Other specified soft tissue disorders: Secondary | ICD-10-CM | POA: Diagnosis not present

## 2021-07-08 ENCOUNTER — Telehealth: Payer: Self-pay | Admitting: Gastroenterology

## 2021-07-08 MED ORDER — NA SULFATE-K SULFATE-MG SULF 17.5-3.13-1.6 GM/177ML PO SOLN: 1.0000 | Freq: Once | ORAL | 0 refills | Status: AC

## 2021-07-08 NOTE — Addendum Note (Signed)
Addended by: Lurlean Nanny on: 07/08/2021 08:47 AM   Modules accepted: Orders

## 2021-07-08 NOTE — Telephone Encounter (Signed)
Patient states she needs her bowel prep sent to her pharmacy and patient also has a few questions about her colonoscopy. Requesting a call back.

## 2021-07-09 MED ORDER — CLENPIQ 10-3.5-12 MG-GM -GM/160ML PO SOLN: 1.0000 | Freq: Once | ORAL | 0 refills | Status: AC

## 2021-07-09 NOTE — Telephone Encounter (Signed)
I called the pharmacy and they stated that the prep is ready for the pt and is a $45 copay

## 2021-07-20 ENCOUNTER — Other Ambulatory Visit: Payer: Self-pay

## 2021-07-20 ENCOUNTER — Encounter
Admission: RE | Disposition: A | Discharge status: Home / Self Care | Payer: Self-pay | Source: Home / Self Care | Attending: Gastroenterology

## 2021-07-20 ENCOUNTER — Encounter: Payer: Self-pay | Admitting: Gastroenterology

## 2021-07-20 ENCOUNTER — Ambulatory Visit: Payer: PPO | Admitting: Anesthesiology

## 2021-07-20 ENCOUNTER — Ambulatory Visit
Admission: RE | Admit: 2021-07-20 | Discharge: 2021-07-20 | Disposition: A | Discharge status: Home / Self Care | Payer: PPO | Attending: Gastroenterology | Admitting: Gastroenterology

## 2021-07-20 DIAGNOSIS — Z86010 Personal history of colonic polyps: Secondary | ICD-10-CM | POA: Diagnosis not present

## 2021-07-20 DIAGNOSIS — D123 Benign neoplasm of transverse colon: Secondary | ICD-10-CM | POA: Insufficient documentation

## 2021-07-20 DIAGNOSIS — Z90710 Acquired absence of both cervix and uterus: Secondary | ICD-10-CM | POA: Insufficient documentation

## 2021-07-20 DIAGNOSIS — K635 Polyp of colon: Secondary | ICD-10-CM | POA: Diagnosis not present

## 2021-07-20 DIAGNOSIS — E785 Hyperlipidemia, unspecified: Secondary | ICD-10-CM | POA: Diagnosis not present

## 2021-07-20 DIAGNOSIS — Z85840 Personal history of malignant neoplasm of eye: Secondary | ICD-10-CM | POA: Insufficient documentation

## 2021-07-20 DIAGNOSIS — K219 Gastro-esophageal reflux disease without esophagitis: Secondary | ICD-10-CM | POA: Diagnosis not present

## 2021-07-20 DIAGNOSIS — Z1211 Encounter for screening for malignant neoplasm of colon: Secondary | ICD-10-CM | POA: Diagnosis not present

## 2021-07-20 DIAGNOSIS — E039 Hypothyroidism, unspecified: Secondary | ICD-10-CM | POA: Insufficient documentation

## 2021-07-20 HISTORY — PX: COLONOSCOPY WITH PROPOFOL: SHX5780

## 2021-07-20 SURGERY — COLONOSCOPY WITH PROPOFOL
Anesthesia: General

## 2021-07-20 MED ORDER — PROPOFOL 500 MG/50ML IV EMUL
INTRAVENOUS | Status: DC | PRN
  Administered 2021-07-20: 155 ug/kg/min via INTRAVENOUS

## 2021-07-20 MED ORDER — PROPOFOL 10 MG/ML IV BOLUS
INTRAVENOUS | Status: DC | PRN
  Administered 2021-07-20: 10 mg via INTRAVENOUS
  Administered 2021-07-20: 40 mg via INTRAVENOUS

## 2021-07-20 MED ORDER — LIDOCAINE HCL (CARDIAC) PF 100 MG/5ML IV SOSY
PREFILLED_SYRINGE | INTRAVENOUS | Status: DC | PRN
  Administered 2021-07-20: 100 mg via INTRAVENOUS

## 2021-07-20 MED ORDER — SODIUM CHLORIDE 0.9 % IV SOLN: INTRAVENOUS | Status: DC

## 2021-07-20 NOTE — H&P (Signed)
Lucilla Lame, MD [facility] [address] [place],  [address] Phone:[phone] Fax : [phone]  Primary Care Physician:  Sofie Hartigan, MD Primary Gastroenterologist:  Dr. Allen Norris  Pre-Procedure History & Physical: HPI:  Christine Savage is a 76 y.o. female is here for an colonoscopy.   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[place])    GERD (gastroesophageal reflux disease)    Hyperlipidemia    Hypothyroidism    Insomnia     Past Surgical History:  Procedure Laterality Date   ABDOMINAL HYSTERECTOMY     CATARACT EXTRACTION W/ INTRAOCULAR LENS  IMPLANT, BILATERAL     COLONOSCOPY WITH PROPOFOL N/A 12/17/2015   Procedure: COLONOSCOPY WITH PROPOFOL;  Surgeon: Lucilla Lame, MD;  Location: [place];  Service: Endoscopy;  Laterality: N/A;   COLONOSCOPY WITH PROPOFOL N/A 02/23/2021   Procedure: COLONOSCOPY WITH PROPOFOL;  Surgeon: Lucilla Lame, MD;  Location: [facility] ENDOSCOPY;  Service: Endoscopy;  Laterality: N/A;   ESOPHAGOGASTRODUODENOSCOPY (EGD) WITH PROPOFOL N/A 12/17/2015   Procedure: ESOPHAGOGASTRODUODENOSCOPY (EGD) WITH PROPOFOL;  Surgeon: Lucilla Lame, MD;  Location: [place];  Service: Endoscopy;  Laterality: N/A;  Latex sensitivity   EYE SURGERY     NASAL SINUS SURGERY     POLYPECTOMY N/A 12/17/2015   Procedure: POLYPECTOMY;  Surgeon: Lucilla Lame, MD;  Location: [place];  Service: Endoscopy;  Laterality: N/A;   ROOT CANAL     TUBAL LIGATION      Prior to Admission medications   Medication Sig Start Date End Date Taking? Authorizing Provider  Calcium Carbonate-Vitamin D 600-400 MG-UNIT tablet Take by mouth.   Yes [provider]  Cyanocobalamin 1000 MCG TBCR Take by mouth.   Yes [provider]  diphenhydrAMINE (BENADRYL) 25 mg capsule Take by mouth.   Yes [provider]  levothyroxine (SYNTHROID, LEVOTHROID) 50 MCG tablet TAKE 1 TABLET (50 MCG TOTAL) BY MOUTH ONCE DAILY.  06/29/15  Yes [provider]  Omega-3 Fatty Acids (FISH OIL PO) Take by mouth.   Yes [provider]  pantoprazole (PROTONIX) 40 MG tablet Take 40 mg by mouth 2 (two) times daily. 12/20/20  Yes [provider]  pravastatin (PRAVACHOL) 20 MG tablet Take by mouth. 06/02/15 07/20/21 Yes [provider]  acetaminophen (TYLENOL) 650 MG CR tablet Take 1,300 mg by mouth.    [provider]  Dexlansoprazole (DEXILANT PO) Take by mouth daily.    [provider]  docusate sodium (COLACE) 100 MG capsule Take 100 mg by mouth.    [provider]  sodium chloride (OCEAN) 0.65 % nasal spray Place into the nose.    [provider]    Allergies as of 06/14/2021 - Review Complete 02/23/2021  Allergen Reaction Noted   Latex Rash 02/17/2015   Sulfa antibiotics Nausea Only 12/16/2014    Family History  Problem Relation Age of Onset   Coronary artery disease Mother    Cancer Father    Coronary artery disease Father    Osteoarthritis Brother    Cancer Brother     Social History   Socioeconomic History   Marital status: Married    Spouse name: Not on file   Number of children: Not on file   Years of education: Not on file   Highest education level: Not on file  Occupational History   Not on file  Tobacco Use   Smoking status: Never   Smokeless tobacco: Never  Vaping  Use   Vaping Use: Never used  Substance and Sexual Activity   Alcohol use: No   Drug use: No   Sexual activity: Not on file  Other Topics Concern   Not on file  Social History Narrative   Not on file   Social Determinants of Health   Financial Resource Strain: Not on file  Food Insecurity: Not on file  Transportation Needs: Not on file  Physical Activity: Not on file  Stress: Not on file  Social Connections: Not on file  Intimate Partner Violence: Not on file    Review of Systems: See HPI, otherwise negative ROS  Physical Exam: BP (!) 158/88    Pulse 72   Temp (!) 97.2 F (36.2 C) (Temporal)   Resp 18   Wt 78 kg   SpO2 98%   BMI 28.62 kg/m  General:   Alert,  pleasant and cooperative in NAD Head:  Normocephalic and atraumatic. Neck:  Supple; no masses or thyromegaly. Lungs:  Clear throughout to auscultation.    Heart:  Regular rate and rhythm. Abdomen:  Soft, nontender and nondistended. Normal bowel sounds, without guarding, and without rebound.   Neurologic:  Alert and  oriented x4;  grossly normal neurologically.  Impression/Plan: Christine Savage is here for an colonoscopy to be performed for a history of adenomatous polyps on 202.3   Risks, benefits, limitations, and alternatives regarding  colonoscopy have been reviewed with the patient.  Questions have been answered.  All parties agreeable.   Lucilla Lame, MD  07/20/2021, 10:24 AM

## 2021-07-20 NOTE — Op Note (Signed)
[facility] Gastroenterology Patient Name: Christine Savage Procedure Date: 07/20/2021 10:23 AM MRN: [id number] Account #: [id number] Date of Birth: Jul 05, 1945 Admit Type: Outpatient Age: 76 Room: [facility] ENDO ROOM 4 Gender: Female Note Status: Finalized Instrument Name: Jasper Riling [id number] Procedure:             Colonoscopy Indications:           High risk colon cancer surveillance: Personal history                         of colonic polyps Providers:             Lucilla Lame MD, MD Referring MD:          Sofie Hartigan (Referring MD) Medicines:             Propofol per Anesthesia Complications:         No immediate complications. Procedure:             Pre-Anesthesia Assessment:                        - Prior to the procedure, a History and Physical was                         performed, and patient medications and allergies were                         reviewed. The patient's tolerance of previous                         anesthesia was also reviewed. The risks and benefits                         of the procedure and the sedation options and risks                         were discussed with the patient. All questions were                         answered, and informed consent was obtained. Prior                         Anticoagulants: The patient has taken no previous                         anticoagulant or antiplatelet agents. ASA Grade                         Assessment: II - A patient with mild systemic disease.                         After reviewing the risks and benefits, the patient                         was deemed in satisfactory condition to undergo the                         procedure.  After obtaining informed consent, the colonoscope was                         passed under direct vision. Throughout the procedure,                         the patient's blood pressure, pulse, and oxygen                         saturations were  monitored continuously. The                         Colonoscope was introduced through the anus and                         advanced to the the cecum, identified by appendiceal                         orifice and ileocecal valve. The colonoscopy was                         performed without difficulty. The patient tolerated                         the procedure well. The quality of the bowel                         preparation was excellent. Findings:      The perianal and digital rectal examinations were normal.      A scar was found in the transverse colon.      A tattoo was seen in the transverse colon.      Biopsies were taken with a cold forceps polypectomy site for histology. Impression:            - Scar in the transverse colon.                        - A tattoo was seen in the transverse colon.                        - Biopsies were taken with a cold forceps for                         histology polypectomy site. Recommendation:        - Discharge patient to home.                        - Resume previous diet.                        - Continue present medications.                        - Await pathology results.                        - Repeat colonoscopy in 3 years for surveillance. Procedure Code(s):     --- Professional ---  45380, Colonoscopy, flexible; with biopsy, single or                         multiple Diagnosis Code(s):     --- Professional ---                        Z86.010, Personal history of colonic polyps CPT copyright 2019 American Medical Association. All rights reserved. The codes documented in this report are preliminary and upon coder review may  be revised to meet current compliance requirements. Lucilla Lame MD, MD 07/20/2021 10:53:35 AM This report has been signed electronically. Number of Addenda: 0 Note Initiated On: 07/20/2021 10:23 AM Scope Withdrawal Time: 0 hours 8 minutes 52 seconds  Total Procedure Duration: 0 hours 12  minutes 9 seconds  Estimated Blood Loss:  Estimated blood loss: none.      [facility]

## 2021-07-20 NOTE — Anesthesia Procedure Notes (Signed)
Procedure Name: General with mask airway Date/Time: 07/20/2021 10:28 AM  Performed by: Kelton Pillar, CRNAPre-anesthesia Checklist: Patient identified, Emergency Drugs available, Suction available and Patient being monitored Patient Re-evaluated:Patient Re-evaluated prior to induction Oxygen Delivery Method: Simple face mask Induction Type: IV induction Placement Confirmation: positive ETCO2, CO2 detector and breath sounds checked- equal and bilateral Dental Injury: Teeth and Oropharynx as per pre-operative assessment

## 2021-07-20 NOTE — Transfer of Care (Signed)
Immediate Anesthesia Transfer of Care Note  Patient: NINFA GIANNELLI  Procedure(s) Performed: COLONOSCOPY WITH PROPOFOL  Patient Location: Endoscopy Unit  Anesthesia Type:General  Level of Consciousness: awake, drowsy and patient cooperative  Airway & Oxygen Therapy: Patient Spontanous Breathing and Patient connected to face mask oxygen  Post-op Assessment: Report given to RN and Post -op Vital signs reviewed and stable  Post vital signs: Reviewed and stable  Last Vitals:  Vitals Value Taken Time  BP 99/63 07/20/21 1055  Temp    Pulse 60 07/20/21 1056  Resp 13 07/20/21 1056  SpO2 100 % 07/20/21 1056  Vitals shown include unvalidated device data.  Last Pain:  Vitals:   07/20/21 1055  TempSrc:   PainSc: 0-No pain         Complications: No notable events documented.

## 2021-07-20 NOTE — Anesthesia Preprocedure Evaluation (Signed)
Anesthesia Evaluation  Patient identified by MRN, date of birth, ID band Patient awake    Reviewed: Allergy & Precautions, NPO status , Patient's Chart, lab work & pertinent test results  History of Anesthesia Complications Negative for: history of anesthetic complications  Airway Mallampati: III  TM Distance: <3 FB Neck ROM: full    Dental  (+) Chipped   Pulmonary neg pulmonary ROS, neg shortness of breath,    Pulmonary exam normal        Cardiovascular Exercise Tolerance: Good (-) anginanegative cardio ROS Normal cardiovascular exam     Neuro/Psych negative neurological ROS  negative psych ROS   GI/Hepatic Neg liver ROS, GERD  Controlled,  Endo/Other  Hypothyroidism   Renal/GU negative Renal ROS  negative genitourinary   Musculoskeletal   Abdominal   Peds  Hematology negative hematology ROS (+)   Anesthesia Other Findings Past Medical History: No date: Arthritis No date: Eye cancer ([place]) No date: GERD (gastroesophageal reflux disease) No date: Hyperlipidemia No date: Hypothyroidism No date: Insomnia  Past Surgical History: No date: ABDOMINAL HYSTERECTOMY No date: CATARACT EXTRACTION W/ INTRAOCULAR LENS  IMPLANT, BILATERAL 12/17/2015: COLONOSCOPY WITH PROPOFOL; N/A     Comment:  Procedure: COLONOSCOPY WITH PROPOFOL;  Surgeon: Lucilla Lame, MD;  Location: [place];  Service:               Endoscopy;  Laterality: N/A; 02/23/2021: COLONOSCOPY WITH PROPOFOL; N/A     Comment:  Procedure: COLONOSCOPY WITH PROPOFOL;  Surgeon: Lucilla Lame, MD;  Location: [facility] ENDOSCOPY;  Service:               Endoscopy;  Laterality: N/A; 12/17/2015: ESOPHAGOGASTRODUODENOSCOPY (EGD) WITH PROPOFOL; N/A     Comment:  Procedure: ESOPHAGOGASTRODUODENOSCOPY (EGD) WITH               PROPOFOL;  Surgeon: Lucilla Lame, MD;  Location: [place];  Service: Endoscopy;   Laterality: N/A;                Latex sensitivity No date: EYE SURGERY No date: NASAL SINUS SURGERY 12/17/2015: POLYPECTOMY; N/A     Comment:  Procedure: POLYPECTOMY;  Surgeon: Lucilla Lame, MD;                Location: [place];  Service: Endoscopy;                Laterality: N/A; No date: ROOT CANAL No date: TUBAL LIGATION  BMI    Body Mass Index: 28.62 kg/m      Reproductive/Obstetrics negative OB ROS                             Anesthesia Physical Anesthesia Plan  ASA: 2  Anesthesia Plan: General   Post-op Pain Management:    Induction: Intravenous  PONV Risk Score and Plan: Propofol infusion and TIVA  Airway Management Planned: Natural Airway and Nasal Cannula  Additional Equipment:   Intra-op Plan:   Post-operative Plan:   Informed Consent: I have reviewed the patients History and Physical, chart, labs and discussed the procedure including the risks, benefits and alternatives for the proposed anesthesia with the patient or authorized representative who has indicated his/her understanding and  acceptance.     Dental Advisory Given  Plan Discussed with: Anesthesiologist, CRNA and Surgeon  Anesthesia Plan Comments: (Patient consented for risks of anesthesia including but not limited to:  - adverse reactions to medications - risk of airway placement if required - damage to eyes, teeth, lips or other oral mucosa - nerve damage due to positioning  - sore throat or hoarseness - Damage to heart, brain, nerves, lungs, other parts of body or loss of life  Patient voiced understanding.)        Anesthesia Quick Evaluation

## 2021-07-20 NOTE — Anesthesia Postprocedure Evaluation (Signed)
Anesthesia Post Note  Patient: Christine Savage  Procedure(s) Performed: COLONOSCOPY WITH PROPOFOL  Patient location during evaluation: Endoscopy Anesthesia Type: General Level of consciousness: awake and alert Pain management: pain level controlled Vital Signs Assessment: post-procedure vital signs reviewed and stable Respiratory status: spontaneous breathing, nonlabored ventilation, respiratory function stable and patient connected to nasal cannula oxygen Cardiovascular status: blood pressure returned to baseline and stable Postop Assessment: no apparent nausea or vomiting Anesthetic complications: no   No notable events documented.   Last Vitals:  Vitals:   07/20/21 1115 07/20/21 1125  BP: 133/71 132/73  Pulse:    Resp: 17 (!) 21  Temp:    SpO2: 100% 100%    Last Pain:  Vitals:   07/20/21 1125  TempSrc:   PainSc: 0-No pain                 Precious Haws Federick Levene

## 2021-07-21 ENCOUNTER — Encounter: Payer: Self-pay | Admitting: Gastroenterology

## 2021-07-21 LAB — SURGICAL PATHOLOGY

## 2021-07-22 ENCOUNTER — Encounter: Payer: Self-pay | Admitting: Gastroenterology

## 2021-08-03 DIAGNOSIS — H4311 Vitreous hemorrhage, right eye: Secondary | ICD-10-CM | POA: Diagnosis not present

## 2021-08-03 DIAGNOSIS — H35351 Cystoid macular degeneration, right eye: Secondary | ICD-10-CM | POA: Diagnosis not present

## 2021-08-03 DIAGNOSIS — H3561 Retinal hemorrhage, right eye: Secondary | ICD-10-CM | POA: Diagnosis not present

## 2021-08-03 DIAGNOSIS — C6931 Malignant neoplasm of right choroid: Secondary | ICD-10-CM | POA: Diagnosis not present

## 2021-08-03 DIAGNOSIS — Z961 Presence of intraocular lens: Secondary | ICD-10-CM | POA: Diagnosis not present

## 2021-08-17 DIAGNOSIS — L02213 Cutaneous abscess of chest wall: Secondary | ICD-10-CM | POA: Diagnosis not present

## 2021-09-01 DIAGNOSIS — L02213 Cutaneous abscess of chest wall: Secondary | ICD-10-CM | POA: Diagnosis not present

## 2021-09-01 DIAGNOSIS — L723 Sebaceous cyst: Secondary | ICD-10-CM | POA: Diagnosis not present

## 2021-09-09 DIAGNOSIS — C6931 Malignant neoplasm of right choroid: Secondary | ICD-10-CM | POA: Diagnosis not present

## 2021-09-09 DIAGNOSIS — H3509 Other intraretinal microvascular abnormalities: Secondary | ICD-10-CM | POA: Diagnosis not present

## 2021-09-09 DIAGNOSIS — H4311 Vitreous hemorrhage, right eye: Secondary | ICD-10-CM | POA: Diagnosis not present

## 2021-09-09 DIAGNOSIS — Z961 Presence of intraocular lens: Secondary | ICD-10-CM | POA: Diagnosis not present

## 2021-09-09 DIAGNOSIS — H3561 Retinal hemorrhage, right eye: Secondary | ICD-10-CM | POA: Diagnosis not present

## 2021-09-09 DIAGNOSIS — H35351 Cystoid macular degeneration, right eye: Secondary | ICD-10-CM | POA: Diagnosis not present

## 2021-10-05 DIAGNOSIS — Z0000 Encounter for general adult medical examination without abnormal findings: Secondary | ICD-10-CM | POA: Diagnosis not present

## 2021-10-05 DIAGNOSIS — M129 Arthropathy, unspecified: Secondary | ICD-10-CM | POA: Diagnosis not present

## 2021-10-05 DIAGNOSIS — K219 Gastro-esophageal reflux disease without esophagitis: Secondary | ICD-10-CM | POA: Diagnosis not present

## 2021-10-05 DIAGNOSIS — R7302 Impaired glucose tolerance (oral): Secondary | ICD-10-CM | POA: Diagnosis not present

## 2021-10-05 DIAGNOSIS — C6931 Malignant neoplasm of right choroid: Secondary | ICD-10-CM | POA: Diagnosis not present

## 2021-10-05 DIAGNOSIS — E039 Hypothyroidism, unspecified: Secondary | ICD-10-CM | POA: Diagnosis not present

## 2021-10-05 DIAGNOSIS — H9193 Unspecified hearing loss, bilateral: Secondary | ICD-10-CM | POA: Diagnosis not present

## 2021-10-05 DIAGNOSIS — F5104 Psychophysiologic insomnia: Secondary | ICD-10-CM | POA: Diagnosis not present

## 2021-10-05 DIAGNOSIS — E7800 Pure hypercholesterolemia, unspecified: Secondary | ICD-10-CM | POA: Diagnosis not present

## 2021-10-05 DIAGNOSIS — Z23 Encounter for immunization: Secondary | ICD-10-CM | POA: Diagnosis not present

## 2021-10-11 DIAGNOSIS — E039 Hypothyroidism, unspecified: Secondary | ICD-10-CM | POA: Diagnosis not present

## 2021-10-11 DIAGNOSIS — R7302 Impaired glucose tolerance (oral): Secondary | ICD-10-CM | POA: Diagnosis not present

## 2021-10-11 DIAGNOSIS — E7800 Pure hypercholesterolemia, unspecified: Secondary | ICD-10-CM | POA: Diagnosis not present

## 2021-10-14 DIAGNOSIS — H35351 Cystoid macular degeneration, right eye: Secondary | ICD-10-CM | POA: Diagnosis not present

## 2021-10-14 DIAGNOSIS — H3509 Other intraretinal microvascular abnormalities: Secondary | ICD-10-CM | POA: Diagnosis not present

## 2021-10-14 DIAGNOSIS — C6931 Malignant neoplasm of right choroid: Secondary | ICD-10-CM | POA: Diagnosis not present

## 2021-11-04 DIAGNOSIS — J301 Allergic rhinitis due to pollen: Secondary | ICD-10-CM | POA: Diagnosis not present

## 2021-11-04 DIAGNOSIS — H6983 Other specified disorders of Eustachian tube, bilateral: Secondary | ICD-10-CM | POA: Diagnosis not present

## 2021-11-04 DIAGNOSIS — H6123 Impacted cerumen, bilateral: Secondary | ICD-10-CM | POA: Diagnosis not present

## 2021-11-04 DIAGNOSIS — H903 Sensorineural hearing loss, bilateral: Secondary | ICD-10-CM | POA: Diagnosis not present

## 2021-11-04 DIAGNOSIS — R42 Dizziness and giddiness: Secondary | ICD-10-CM | POA: Diagnosis not present

## 2021-11-18 DIAGNOSIS — H35351 Cystoid macular degeneration, right eye: Secondary | ICD-10-CM | POA: Diagnosis not present

## 2021-11-18 DIAGNOSIS — C6931 Malignant neoplasm of right choroid: Secondary | ICD-10-CM | POA: Diagnosis not present

## 2021-11-18 DIAGNOSIS — H4311 Vitreous hemorrhage, right eye: Secondary | ICD-10-CM | POA: Diagnosis not present

## 2021-11-18 DIAGNOSIS — Z961 Presence of intraocular lens: Secondary | ICD-10-CM | POA: Diagnosis not present

## 2021-11-18 DIAGNOSIS — H3509 Other intraretinal microvascular abnormalities: Secondary | ICD-10-CM | POA: Diagnosis not present

## 2021-12-01 DIAGNOSIS — H6980 Other specified disorders of Eustachian tube, unspecified ear: Secondary | ICD-10-CM | POA: Diagnosis not present

## 2021-12-01 DIAGNOSIS — R42 Dizziness and giddiness: Secondary | ICD-10-CM | POA: Diagnosis not present

## 2021-12-30 DIAGNOSIS — H35351 Cystoid macular degeneration, right eye: Secondary | ICD-10-CM | POA: Diagnosis not present

## 2021-12-30 DIAGNOSIS — Z961 Presence of intraocular lens: Secondary | ICD-10-CM | POA: Diagnosis not present

## 2021-12-30 DIAGNOSIS — H3509 Other intraretinal microvascular abnormalities: Secondary | ICD-10-CM | POA: Diagnosis not present

## 2021-12-30 DIAGNOSIS — C6931 Malignant neoplasm of right choroid: Secondary | ICD-10-CM | POA: Diagnosis not present

## 2021-12-30 DIAGNOSIS — H4311 Vitreous hemorrhage, right eye: Secondary | ICD-10-CM | POA: Diagnosis not present

## 2022-02-24 DIAGNOSIS — Z961 Presence of intraocular lens: Secondary | ICD-10-CM | POA: Diagnosis not present

## 2022-02-24 DIAGNOSIS — H3509 Other intraretinal microvascular abnormalities: Secondary | ICD-10-CM | POA: Diagnosis not present

## 2022-02-24 DIAGNOSIS — C6931 Malignant neoplasm of right choroid: Secondary | ICD-10-CM | POA: Diagnosis not present

## 2022-02-24 DIAGNOSIS — H4311 Vitreous hemorrhage, right eye: Secondary | ICD-10-CM | POA: Diagnosis not present

## 2022-02-24 DIAGNOSIS — H35351 Cystoid macular degeneration, right eye: Secondary | ICD-10-CM | POA: Diagnosis not present

## 2022-03-02 DIAGNOSIS — H43813 Vitreous degeneration, bilateral: Secondary | ICD-10-CM | POA: Diagnosis not present

## 2022-03-02 DIAGNOSIS — M3501 Sicca syndrome with keratoconjunctivitis: Secondary | ICD-10-CM | POA: Diagnosis not present

## 2022-03-02 DIAGNOSIS — Z961 Presence of intraocular lens: Secondary | ICD-10-CM | POA: Diagnosis not present

## 2022-03-02 DIAGNOSIS — C6931 Malignant neoplasm of right choroid: Secondary | ICD-10-CM | POA: Diagnosis not present

## 2022-03-29 DIAGNOSIS — E039 Hypothyroidism, unspecified: Secondary | ICD-10-CM | POA: Diagnosis not present

## 2022-03-29 DIAGNOSIS — E7800 Pure hypercholesterolemia, unspecified: Secondary | ICD-10-CM | POA: Diagnosis not present

## 2022-03-29 DIAGNOSIS — M129 Arthropathy, unspecified: Secondary | ICD-10-CM | POA: Diagnosis not present

## 2022-03-29 DIAGNOSIS — K219 Gastro-esophageal reflux disease without esophagitis: Secondary | ICD-10-CM | POA: Diagnosis not present

## 2022-03-29 DIAGNOSIS — R7302 Impaired glucose tolerance (oral): Secondary | ICD-10-CM | POA: Diagnosis not present

## 2022-04-06 DIAGNOSIS — F419 Anxiety disorder, unspecified: Secondary | ICD-10-CM | POA: Diagnosis not present

## 2022-04-06 DIAGNOSIS — F5104 Psychophysiologic insomnia: Secondary | ICD-10-CM | POA: Diagnosis not present

## 2022-04-06 DIAGNOSIS — M129 Arthropathy, unspecified: Secondary | ICD-10-CM | POA: Diagnosis not present

## 2022-04-06 DIAGNOSIS — C6931 Malignant neoplasm of right choroid: Secondary | ICD-10-CM | POA: Diagnosis not present

## 2022-04-06 DIAGNOSIS — R7302 Impaired glucose tolerance (oral): Secondary | ICD-10-CM | POA: Diagnosis not present

## 2022-04-06 DIAGNOSIS — L309 Dermatitis, unspecified: Secondary | ICD-10-CM | POA: Diagnosis not present

## 2022-04-06 DIAGNOSIS — E7800 Pure hypercholesterolemia, unspecified: Secondary | ICD-10-CM | POA: Diagnosis not present

## 2022-04-06 DIAGNOSIS — E039 Hypothyroidism, unspecified: Secondary | ICD-10-CM | POA: Diagnosis not present

## 2022-04-06 DIAGNOSIS — K219 Gastro-esophageal reflux disease without esophagitis: Secondary | ICD-10-CM | POA: Diagnosis not present

## 2022-04-06 DIAGNOSIS — R748 Abnormal levels of other serum enzymes: Secondary | ICD-10-CM | POA: Diagnosis not present

## 2022-04-21 DIAGNOSIS — H35351 Cystoid macular degeneration, right eye: Secondary | ICD-10-CM | POA: Diagnosis not present

## 2022-04-21 DIAGNOSIS — H3509 Other intraretinal microvascular abnormalities: Secondary | ICD-10-CM | POA: Diagnosis not present

## 2022-05-05 ENCOUNTER — Encounter: Payer: Self-pay | Admitting: Gastroenterology

## 2022-05-05 ENCOUNTER — Ambulatory Visit (INDEPENDENT_AMBULATORY_CARE_PROVIDER_SITE_OTHER): Payer: PPO | Admitting: Gastroenterology

## 2022-05-05 VITALS — BP 127/80 | HR 79 | Temp 98.1°F | Wt 177.0 lb

## 2022-05-05 DIAGNOSIS — R748 Abnormal levels of other serum enzymes: Secondary | ICD-10-CM

## 2022-05-05 DIAGNOSIS — K219 Gastro-esophageal reflux disease without esophagitis: Secondary | ICD-10-CM

## 2022-05-05 NOTE — Patient Instructions (Addendum)
Ultrasound has been scheduled for May 09, 2022 at [facility]  Arrive at 8:15AM to register  You can not have anything to eat or drink after 12 midnight the day before   If you need to cancel or reschedule, please call [phone]

## 2022-05-05 NOTE — Progress Notes (Signed)
Primary Care Physician: Marina Goodell, MD  Primary Gastroenterologist:  Dr. Midge Minium  Chief Complaint  Patient presents with   Gastroesophageal Reflux    HPI: Christine Savage is a 77 y.o. female here with a history of having a large polyp removed by me.  The patient came back for repeat colonoscopy and the polyp site looks well healed.  The patient now comes with report of GERD.  Patient also has elevated liver enzymes with a distantly elevated bilirubin with an ALT of 42 with the upper limit of normal being 38.  The AST and ALT the year prior were normal. The patient reports that she does not drink any alcohol.  She also reports that she had significant heartburn usually in the melanite but states that when she stopped lemonade and soda her symptoms have completely abated.  There is no report of any dysphagia nausea vomiting fevers or chills.  The patient reports that she has not been told that she had abnormal liver enzymes in the past.  Past Medical History:  Diagnosis Date   Arthritis    Eye cancer    GERD (gastroesophageal reflux disease)    Hyperlipidemia    Hypothyroidism    Insomnia     Current Outpatient Medications  Medication Sig Dispense Refill   acetaminophen (TYLENOL) 650 MG CR tablet Take 1,300 mg by mouth.     Calcium Carbonate-Vitamin D 600-400 MG-UNIT tablet Take by mouth.     Cyanocobalamin 1000 MCG TBCR Take by mouth.     Dexlansoprazole (DEXILANT PO) Take by mouth daily.     diphenhydrAMINE (BENADRYL) 25 mg capsule Take by mouth.     docusate sodium (COLACE) 100 MG capsule Take 100 mg by mouth.     levothyroxine (SYNTHROID, LEVOTHROID) 50 MCG tablet TAKE 1 TABLET (50 MCG TOTAL) BY MOUTH ONCE DAILY.     Omega-3 Fatty Acids (FISH OIL PO) Take by mouth.     pantoprazole (PROTONIX) 40 MG tablet Take 40 mg by mouth 2 (two) times daily.     sodium chloride (OCEAN) 0.65 % nasal spray Place into the nose.     pravastatin (PRAVACHOL) 20 MG tablet Take by  mouth.     No current facility-administered medications for this visit.    Allergies as of 05/05/2022 - Review Complete 05/05/2022  Allergen Reaction Noted   Latex Rash 02/17/2015   Sulfa antibiotics Nausea Only 12/16/2014    ROS:  General: Negative for anorexia, weight loss, fever, chills, fatigue, weakness. ENT: Negative for hoarseness, difficulty swallowing , nasal congestion. CV: Negative for chest pain, angina, palpitations, dyspnea on exertion, peripheral edema.  Respiratory: Negative for dyspnea at rest, dyspnea on exertion, cough, sputum, wheezing.  GI: See history of present illness. GU:  Negative for dysuria, hematuria, urinary incontinence, urinary frequency, nocturnal urination.  Endo: Negative for unusual weight change.    Physical Examination:   BP 127/80 (BP Location: Left Arm, Patient Position: Sitting, Cuff Size: Large)   Pulse 79   Temp 98.1 F (36.7 C) (Oral)   Wt 177 lb (80.3 kg)   BMI 29.45 kg/m   General: Well-nourished, well-developed in no acute distress.  Eyes: No icterus. Conjunctivae pink. Neuro: Alert and oriented x 3.  Grossly intact. Skin: Warm and dry, no jaundice.   Psych: Alert and cooperative, normal mood and affect.  Labs:    Imaging Studies: No results found.  Assessment and Plan:   Christine Savage is a 77 y.o. y/o female  who comes in today with worsening GERD on her pantoprazole 40 mg twice a day.  She is eliminated lemonade and soda reports her symptoms are completely resolved.  The patient has been told to avoid those drinks to avoid any further symptoms and she has been told that she can try to take the pantoprazole only in the evening to see if that can be decreased only once a day.  The patient has also been told that we will send off blood work for possible causes of her abnormal liver enzymes and will also get a right upper quadrant ultrasound.  The patient has been explained the plan agrees with it.     Midge Minium, MD.  Clementeen Graham    Note: This dictation was prepared with Dragon dictation along with smaller phrase technology. Any transcriptional errors that result from this process are unintentional.

## 2022-05-06 LAB — HEPATITIS C ANTIBODY: Hep C Virus Ab: NONREACTIVE

## 2022-05-06 LAB — HEPATITIS B SURFACE ANTIBODY,QUALITATIVE: Hep B Surface Ab, Qual: NONREACTIVE

## 2022-05-06 LAB — HEPATIC FUNCTION PANEL
ALT: 60 IU/L — ABNORMAL HIGH (ref 0–32)
AST: 45 IU/L — ABNORMAL HIGH (ref 0–40)
Albumin: 4 g/dL (ref 3.8–4.8)
Alkaline Phosphatase: 115 IU/L (ref 44–121)
Bilirubin Total: 1.1 mg/dL (ref 0.0–1.2)
Bilirubin, Direct: 0.31 mg/dL (ref 0.00–0.40)
Total Protein: 5.4 g/dL — ABNORMAL LOW (ref 6.0–8.5)

## 2022-05-06 LAB — ANTI-SMOOTH MUSCLE ANTIBODY, IGG: Smooth Muscle Ab: 5 Units (ref 0–19)

## 2022-05-06 LAB — HEPATITIS B SURFACE ANTIGEN: Hepatitis B Surface Ag: NEGATIVE

## 2022-05-06 LAB — HEPATITIS B CORE ANTIBODY, TOTAL: Hep B Core Total Ab: NEGATIVE

## 2022-05-06 LAB — IRON,TIBC AND FERRITIN PANEL
Ferritin: 85 ng/mL (ref 15–150)
Iron Saturation: 38 % (ref 15–55)
Iron: 99 ug/dL (ref 27–139)
Total Iron Binding Capacity: 263 ug/dL (ref 250–450)
UIBC: 164 ug/dL (ref 118–369)

## 2022-05-06 LAB — MITOCHONDRIAL ANTIBODIES: Mitochondrial Ab: <20 Units (ref 0.0–20.0)

## 2022-05-06 LAB — ANA: Anti Nuclear Antibody (ANA): NEGATIVE

## 2022-05-06 LAB — CERULOPLASMIN: Ceruloplasmin: 22.3 mg/dL (ref 19.0–39.0)

## 2022-05-06 LAB — HEPATITIS A ANTIBODY, TOTAL: hep A Total Ab: NEGATIVE

## 2022-05-06 LAB — ALPHA-1-ANTITRYPSIN: A-1 Antitrypsin: 137 mg/dL (ref 101–187)

## 2022-05-09 ENCOUNTER — Ambulatory Visit
Admission: RE | Admit: 2022-05-09 | Discharge: 2022-05-09 | Disposition: A | Discharge status: Home / Self Care | Payer: PPO | Source: Ambulatory Visit | Attending: Gastroenterology | Admitting: Gastroenterology

## 2022-05-09 DIAGNOSIS — R748 Abnormal levels of other serum enzymes: Secondary | ICD-10-CM

## 2022-05-09 DIAGNOSIS — R945 Abnormal results of liver function studies: Secondary | ICD-10-CM | POA: Diagnosis not present

## 2022-05-12 ENCOUNTER — Other Ambulatory Visit: Payer: Self-pay

## 2022-05-12 DIAGNOSIS — K769 Liver disease, unspecified: Secondary | ICD-10-CM

## 2022-05-16 ENCOUNTER — Telehealth: Payer: Self-pay | Admitting: Gastroenterology

## 2022-05-16 NOTE — Telephone Encounter (Signed)
Pt left vm  in ref to (MRI) please return call

## 2022-05-23 ENCOUNTER — Ambulatory Visit
Admission: RE | Admit: 2022-05-23 | Discharge: 2022-05-23 | Disposition: A | Discharge status: Home / Self Care | Payer: PPO | Source: Ambulatory Visit | Attending: Gastroenterology | Admitting: Gastroenterology

## 2022-05-23 DIAGNOSIS — K769 Liver disease, unspecified: Secondary | ICD-10-CM | POA: Insufficient documentation

## 2022-05-23 DIAGNOSIS — K7689 Other specified diseases of liver: Secondary | ICD-10-CM | POA: Diagnosis not present

## 2022-05-23 MED ORDER — GADOBUTROL 1 MMOL/ML IV SOLN
8.0000 mL | Freq: Once | INTRAVENOUS | Status: AC | PRN
  Administered 2022-05-23: 8 mL via INTRAVENOUS

## 2022-05-25 ENCOUNTER — Telehealth: Payer: Self-pay | Admitting: Gastroenterology

## 2022-05-25 ENCOUNTER — Telehealth: Payer: Self-pay

## 2022-05-25 NOTE — Telephone Encounter (Signed)
PT left message to get a call back on MRI results please retun call

## 2022-05-25 NOTE — Telephone Encounter (Signed)
Pt left vm for a return call regarding her MRI results.

## 2022-05-26 ENCOUNTER — Encounter: Payer: Self-pay | Admitting: Gastroenterology

## 2022-05-26 ENCOUNTER — Ambulatory Visit (INDEPENDENT_AMBULATORY_CARE_PROVIDER_SITE_OTHER): Payer: PPO | Admitting: Gastroenterology

## 2022-05-26 VITALS — BP 149/78 | HR 59 | Temp 98.1°F | Ht 65.0 in | Wt 174.0 lb

## 2022-05-26 DIAGNOSIS — M898X9 Other specified disorders of bone, unspecified site: Secondary | ICD-10-CM

## 2022-05-26 DIAGNOSIS — R160 Hepatomegaly, not elsewhere classified: Secondary | ICD-10-CM

## 2022-05-26 NOTE — Progress Notes (Signed)
Primary Care Physician: Marina Goodell, MD  Primary Gastroenterologist:  Dr. Midge Minium  Chief Complaint  Patient presents with   Follow-up    MRI     HPI: Christine Savage is a 77 y.o. female here for follow-up after having a MRI due to an ultrasound showing liver lesions.  The MRI showed:  IMPRESSION: 1. Diffuse hepatic replacement with heterogeneous T2 signal and enhancement, favoring metastatic disease. Given the clinical history and some areas of precontrast T1 hyperintensity, favor melanoma metastasis. This could either be sampled via ultrasound or PET performed to direct biopsy. 2. Heterogeneous marrow signal with areas of hyperenhancement, suspicious for osseous metastasis. 3.  Aortic Atherosclerosis   The patient reports today that she is also having back pain and side pain and not having a good week at all.  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HCC)    GERD (gastroesophageal reflux disease)    Hyperlipidemia    Hypothyroidism    Insomnia     Current Outpatient Medications  Medication Sig Dispense Refill   acetaminophen (TYLENOL) 650 MG CR tablet Take 1,300 mg by mouth.     Calcium Carbonate-Vitamin D 600-400 MG-UNIT tablet Take by mouth.     Cyanocobalamin 1000 MCG TBCR Take by mouth.     Dexlansoprazole (DEXILANT PO) Take by mouth daily.     diphenhydrAMINE (BENADRYL) 25 mg capsule Take by mouth.     docusate sodium (COLACE) 100 MG capsule Take 100 mg by mouth.     levothyroxine (SYNTHROID, LEVOTHROID) 50 MCG tablet TAKE 1 TABLET (50 MCG TOTAL) BY MOUTH ONCE DAILY.     Omega-3 Fatty Acids (FISH OIL PO) Take by mouth.     pantoprazole (PROTONIX) 40 MG tablet Take 40 mg by mouth 2 (two) times daily.     pravastatin (PRAVACHOL) 20 MG tablet Take by mouth.     sodium chloride (OCEAN) 0.65 % nasal spray Place into the nose.     No current facility-administered medications for this visit.    Allergies as of 05/26/2022 -  Review Complete 05/05/2022  Allergen Reaction Noted   Latex Rash 02/17/2015   Sulfa antibiotics Nausea Only 12/16/2014    ROS:  General: Negative for anorexia, weight loss, fever, chills, fatigue, weakness. ENT: Negative for hoarseness, difficulty swallowing , nasal congestion. CV: Negative for chest pain, angina, palpitations, dyspnea on exertion, peripheral edema.  Respiratory: Negative for dyspnea at rest, dyspnea on exertion, cough, sputum, wheezing.  GI: See history of present illness. GU:  Negative for dysuria, hematuria, urinary incontinence, urinary frequency, nocturnal urination.  Endo: Negative for unusual weight change.    Physical Examination:   BP (!) 149/78 (BP Location: Left Arm, Patient Position: Sitting, Cuff Size: Normal)   Pulse (!) 59   Temp 98.1 F (36.7 C) (Oral)   Ht 5\' 5"  (1.651 m)   Wt 174 lb (78.9 kg)   BMI 28.96 kg/m   General: Well-nourished, well-developed in no acute distress.  Eyes: No icterus. Conjunctivae pink. Neuro: Alert and oriented x 3.  Grossly intact. Skin: Warm and dry, no jaundice.   Psych: Alert and cooperative, normal mood and affect.  Labs:    Imaging Studies: MR LIVER W WO CONTRAST  Result Date: 05/23/2022 CLINICAL DATA:  Abnormal ultrasound performed for elevated liver function tests. Prior CT history includes melanoma. EXAM: MRI ABDOMEN WITHOUT AND WITH CONTRAST TECHNIQUE: Multiplanar multisequence MR imaging of the abdomen was performed both before  and after the administration of intravenous contrast. CONTRAST:  8mL GADAVIST GADOBUTROL 1 MMOL/ML IV SOLN COMPARISON:  Ultrasound 05/09/2022 and the CT of 11/11/2020 FINDINGS: Lower chest: Mild cardiomegaly, without pericardial or pleural effusion. Hepatobiliary: The liver is entirely replaced by heterogeneous T2 signal and postcontrast enhancement, highly suspicious for metastatic disease. Individual lesions are difficult to measure secondary to diffuse infiltrative morphology.  Example of diffuse restricted diffusion including on 81/7. Some lesions are precontrast T1 hyperintense on series 11. Example enhancing segment 4A lesion of 7 mm on 25/16 subtracted. Normal gallbladder, without biliary ductal dilatation. Pancreas:  Normal, without mass or ductal dilatation. Spleen:  Normal in size, without focal abnormality. Adrenals/Urinary Tract: Normal adrenal glands. Normal kidneys, without hydronephrosis. Stomach/Bowel: Normal stomach. Periampullary duodenal diverticulum. Otherwise normal small bowel. Large colonic stool burden. Vascular/Lymphatic: Aortic atherosclerosis. No retroperitoneal or retrocrural adenopathy. Other:  No ascites. Musculoskeletal: Mildly heterogeneous marrow signal with foci of hyperenhancement including at 1.4 cm in the L5 vertebral body on 34/19. At L1 a 5 mm hyperenhancing lesion on 24/19. IMPRESSION: 1. Diffuse hepatic replacement with heterogeneous T2 signal and enhancement, favoring metastatic disease. Given the clinical history and some areas of precontrast T1 hyperintensity, favor melanoma metastasis. This could either be sampled via ultrasound or PET performed to direct biopsy. 2. Heterogeneous marrow signal with areas of hyperenhancement, suspicious for osseous metastasis. 3.  Aortic Atherosclerosis (ICD10-I70.0). These results will be called to the ordering clinician or representative by the Radiologist Assistant, and communication documented in the PACS or [employer]. Electronically Signed   By: Jeronimo Greaves M.D.   On: 05/23/2022 16:12   US ABDOMEN LIMITED RUQ (LIVER/GB)  Result Date: 05/09/2022 CLINICAL DATA:  Elevated LFTs EXAM: ULTRASOUND ABDOMEN LIMITED RIGHT UPPER QUADRANT COMPARISON:  CT abdomen pelvis 11/11/2020 FINDINGS: Gallbladder: No gallstones or wall thickening visualized. No sonographic Murphy sign noted by sonographer. Common bile duct: Diameter: 5.3 mm Liver: Heterogeneous echogenicity of the liver with mildly nodular contour. Within  the left hepatic lobe there is a 1.0 x 0.8 x 1.1 cm hypoechoic lesion, within the right hepatic lobe there is a 1.2 x 1.3 x 1.1 cm hypoechoic lesion and a 1.4 x 1.0 x 1.2 cm hypoechoic lesion. Portal vein is patent on color Doppler imaging with normal direction of blood flow towards the liver. Other: None. IMPRESSION: 1. Heterogeneous echogenicity of the liver with mildly nodular contour. Findings may reflect underlying cirrhosis. 2. There are a few hypoechoic lesions within the liver which are indeterminate. Recommend further evaluation with dedicated liver protocol MRI. 3. No cholelithiasis or sonographic evidence for acute cholecystitis. 4. These results will be called to the ordering clinician or representative by the Radiologist Assistant, and communication documented in the PACS or [employer]. Electronically Signed   By: Annia Belt M.D.   On: 05/09/2022 09:23    Assessment and Plan:   Christine Savage is a 77 y.o. y/o female who comes in today after having an MRI with diffuse hepatic replacement with signal enhancement favoring metastatic disease with hyperintensity favoring metastatic melanoma.  There also heterogeneous marrow signal with areas of hyperenhancement suspicious for bony metastasis.  The patient has been explained the findings and will be set up for a stat hematology/oncology consultation.     Midge Minium, MD. Clementeen Graham    Note: This dictation was prepared with Dragon dictation along with smaller phrase technology. Any transcriptional errors that result from this process are unintentional.

## 2022-05-27 ENCOUNTER — Inpatient Hospital Stay: Payer: PPO | Attending: Oncology | Admitting: Oncology

## 2022-05-27 ENCOUNTER — Encounter: Payer: Self-pay | Admitting: Oncology

## 2022-05-27 ENCOUNTER — Inpatient Hospital Stay: Payer: PPO

## 2022-05-27 VITALS — BP 128/75 | HR 61 | Temp 97.6°F | Resp 20 | Wt 178.4 lb

## 2022-05-27 DIAGNOSIS — Z803 Family history of malignant neoplasm of breast: Secondary | ICD-10-CM | POA: Diagnosis not present

## 2022-05-27 DIAGNOSIS — C787 Secondary malignant neoplasm of liver and intrahepatic bile duct: Secondary | ICD-10-CM | POA: Diagnosis not present

## 2022-05-27 DIAGNOSIS — R932 Abnormal findings on diagnostic imaging of liver and biliary tract: Secondary | ICD-10-CM

## 2022-05-27 DIAGNOSIS — R160 Hepatomegaly, not elsewhere classified: Secondary | ICD-10-CM

## 2022-05-27 DIAGNOSIS — C439 Malignant melanoma of skin, unspecified: Secondary | ICD-10-CM

## 2022-05-27 DIAGNOSIS — Z85820 Personal history of malignant melanoma of skin: Secondary | ICD-10-CM | POA: Insufficient documentation

## 2022-05-27 DIAGNOSIS — M898X9 Other specified disorders of bone, unspecified site: Secondary | ICD-10-CM

## 2022-05-27 DIAGNOSIS — C7951 Secondary malignant neoplasm of bone: Secondary | ICD-10-CM | POA: Insufficient documentation

## 2022-05-27 DIAGNOSIS — K7689 Other specified diseases of liver: Secondary | ICD-10-CM

## 2022-05-27 DIAGNOSIS — Z79899 Other long term (current) drug therapy: Secondary | ICD-10-CM | POA: Diagnosis not present

## 2022-05-27 DIAGNOSIS — R948 Abnormal results of function studies of other organs and systems: Secondary | ICD-10-CM | POA: Diagnosis not present

## 2022-05-27 DIAGNOSIS — M899 Disorder of bone, unspecified: Secondary | ICD-10-CM | POA: Diagnosis not present

## 2022-05-27 LAB — LACTATE DEHYDROGENASE: LDH: 170 U/L (ref 98–192)

## 2022-05-27 LAB — COMPREHENSIVE METABOLIC PANEL
ALT: 67 U/L — ABNORMAL HIGH (ref 0–44)
AST: 55 U/L — ABNORMAL HIGH (ref 15–41)
Albumin: 3.8 g/dL (ref 3.5–5.0)
Alkaline Phosphatase: 93 U/L (ref 38–126)
Anion gap: 9 (ref 5–15)
BUN: 7 mg/dL — ABNORMAL LOW (ref 8–23)
CO2: 26 mmol/L (ref 22–32)
Calcium: 8.7 mg/dL — ABNORMAL LOW (ref 8.9–10.3)
Chloride: 104 mmol/L (ref 98–111)
Creatinine, Ser: 0.85 mg/dL (ref 0.44–1.00)
GFR, Estimated: >60 mL/min (ref >60)
Glucose, Bld: 89 mg/dL (ref 70–99)
Potassium: 4.2 mmol/L (ref 3.5–5.1)
Sodium: 139 mmol/L (ref 135–145)
Total Bilirubin: 2.1 mg/dL — ABNORMAL HIGH (ref 0.3–1.2)
Total Protein: 6.3 g/dL — ABNORMAL LOW (ref 6.5–8.1)

## 2022-05-27 LAB — CBC WITH DIFFERENTIAL/PLATELET
Abs Immature Granulocytes: 0.01 10*3/uL (ref 0.00–0.07)
Basophils Absolute: 0.1 10*3/uL (ref 0.0–0.1)
Basophils Relative: 1 %
Eosinophils Absolute: 0.1 10*3/uL (ref 0.0–0.5)
Eosinophils Relative: 3 %
HCT: 46.7 % — ABNORMAL HIGH (ref 36.0–46.0)
Hemoglobin: 15.3 g/dL — ABNORMAL HIGH (ref 12.0–15.0)
Immature Granulocytes: 0 %
Lymphocytes Relative: 32 %
Lymphs Abs: 1.7 10*3/uL (ref 0.7–4.0)
MCH: 30.5 pg (ref 26.0–34.0)
MCHC: 32.8 g/dL (ref 30.0–36.0)
MCV: 93.2 fL (ref 80.0–100.0)
Monocytes Absolute: 0.5 10*3/uL (ref 0.1–1.0)
Monocytes Relative: 9 %
Neutro Abs: 2.9 10*3/uL (ref 1.7–7.7)
Neutrophils Relative %: 55 %
Platelets: 177 10*3/uL (ref 150–400)
RBC: 5.01 MIL/uL (ref 3.87–5.11)
RDW: 13.6 % (ref 11.5–15.5)
WBC: 5.2 10*3/uL (ref 4.0–10.5)
nRBC: 0 % (ref 0.0–0.2)

## 2022-05-27 NOTE — Progress Notes (Signed)
Gilmer Mor, DO sent to [facility] for US guided liver mass biopsy.  [address]

## 2022-05-27 NOTE — Progress Notes (Signed)
Hematology/Oncology Consult note [facility] [phone] [phone]  Patient Care Team: Marina Goodell, MD as PCP - General (Family Medicine)   Name of the patient: Christine Savage  [id number]  Feb 24, 1945    Reason for referral-concern for liver metastases   Referring physician-Dr. 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  She is s/p I 125 plaque radiotherapy placed on 11/16/20 and removed 11/20/20 she sees Dr. Tye Savoy from [facility] for the same.  Recently patient has been onIntraocular a Avastin injections as well.  Patient was seen by Dr. Servando Snare from GI for abnormal LFTs.As a part of his workup he had ordered an ultrasound abdomen which showed indeterminate hypoechoic lesions in the liver and MRI was recommended.  MRI liver with and without contrast showed diffuse hepatic replacement with heterogeneous T2 signal and enhancement favoring metastatic disease.  Given the clinical history some of these areas favor melanoma metastases.  Heterogenous marrow signal including a 1.4 cm L5 vertebral body lesion concerning for bone metastases.  Patient has been referred for further management  Family history significant for breast cancer in her paternal grandmother.  Father died of brain tumor.  Patient does not remember having melanoma involving her skin.  She states that about 35 years ago there was a lesion removed from the tip of her nose which was possibly cancer.  She lives with her husband and is independent of her ADLs and IADLs.  She reports mild low back pain but denies other complaints at this time  ECOG PS- 1  Pain scale- 2   Review of systems- Review of Systems  Constitutional:  Negative for chills, fever, malaise/fatigue and weight loss.  HENT:  Negative for congestion, ear discharge and nosebleeds.   Eyes:   Negative for blurred vision.  Respiratory:  Negative for cough, hemoptysis, sputum production, shortness of breath and wheezing.   Cardiovascular:  Negative for chest pain, palpitations, orthopnea and claudication.  Gastrointestinal:  Negative for abdominal pain, blood in stool, constipation, diarrhea, heartburn, melena, nausea and vomiting.  Genitourinary:  Negative for dysuria, flank pain, frequency, hematuria and urgency.  Musculoskeletal:  Positive for back pain. Negative for joint pain and myalgias.  Skin:  Negative for rash.  Neurological:  Negative for dizziness, tingling, focal weakness, seizures, weakness and headaches.  Endo/Heme/Allergies:  Does not bruise/bleed easily.  Psychiatric/Behavioral:  Negative for depression and suicidal ideas. The patient does not have insomnia.     Allergies  Allergen Reactions   Latex Rash    From Bandaids   Sulfa Antibiotics Nausea Only    Patient Active Problem List   Diagnosis Date Noted   History of colonic polyps    Change in bowel habit    Polyp of transverse colon    Special screening for malignant neoplasms, colon    Benign neoplasm of descending colon    Dysphagia    Mild dietary indigestion    Hypothyroidism    History of recurrent UTIs 06/15/2015   Primary osteoarthritis involving multiple joints 06/15/2015   PMR (polymyalgia rheumatica) (HCC) 12/24/2014   Acute pain of left shoulder 12/16/2014   Arthralgia of both hands 12/16/2014   Arthritis 12/16/2014   Chronic bilateral low back pain without sciatica 12/16/2014   Chronic neck pain 12/16/2014   Dyslipidemia 12/16/2014   Gastroesophageal reflux disease without esophagitis 12/16/2014   Osteoarthritis 12/16/2014  Weakness generalized 12/16/2014   Hypercholesterolemia 12/13/2011   Insomnia 12/13/2011     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HCC)    GERD (gastroesophageal reflux disease)    Hyperlipidemia    Hypothyroidism    Insomnia       Past Surgical History:  Procedure Laterality Date   ABDOMINAL HYSTERECTOMY     CATARACT EXTRACTION W/ INTRAOCULAR LENS  IMPLANT, BILATERAL     COLONOSCOPY WITH PROPOFOL N/A 12/17/2015   Procedure: COLONOSCOPY WITH PROPOFOL;  Surgeon: Midge Minium, MD;  Location: [facility];  Service: Endoscopy;  Laterality: N/A;   COLONOSCOPY WITH PROPOFOL N/A 02/23/2021   Procedure: COLONOSCOPY WITH PROPOFOL;  Surgeon: Midge Minium, MD;  Location: [facility] END[facility];  Service: Endoscopy;  Laterality: N/A;   COLONOSCOPY WITH PROPOFOL N/A 07/20/2021   Procedure: COLONOSCOPY WITH PROPOFOL;  Surgeon: Midge Minium, MD;  Location: [facility] ENDOSCOPY;  Service: Endoscopy;  Laterality: N/A;   ESOPHAGOGASTRODUODENOSCOPY (EGD) WITH PROPOFOL N/A 12/17/2015   Procedure: ESOPHAGOGASTRODUODENOSCOPY (EGD) WITH PROPOFOL;  Surgeon: Midge Minium, MD;  Location: [facility];  Service: Endoscopy;  Laterality: N/A;  Latex sensitivity   EYE SURGERY     NASAL SINUS SURGERY     POLYPECTOMY N/A 12/17/2015   Procedure: POLYPECTOMY;  Surgeon: Midge Minium, MD;  Location: [facility];  Service: Endoscopy;  Laterality: N/A;   ROOT CANAL     TUBAL LIGATION      Social History   Socioeconomic History   Marital status: Married    Spouse name: Not on file   Number of children: Not on file   Years of education: Not on file   Highest education level: Not on file  Occupational History   Not on file  Tobacco Use   Smoking status: Never   Smokeless tobacco: Never  Vaping Use   Vaping Use: Never used  Substance and Sexual Activity   Alcohol use: No   Drug use: No   Sexual activity: Not Currently    Birth control/protection: None  Other Topics Concern   Not on file  Social History Narrative   Not on file   Social Determinants of Health   Financial Resource Strain: Not on file  Food Insecurity: Not on file  Transportation Needs: Not on file  Physical Activity: Not on file  Stress: Not on file  Social  Connections: Not on file  Intimate Partner Violence: Not on file     Family History  Problem Relation Age of Onset   Coronary artery disease Mother    Cancer Father    Coronary artery disease Father    Osteoarthritis Brother    Cancer Brother      Current Outpatient Medications:    acetaminophen (TYLENOL) 650 MG CR tablet, Take 1,300 mg by mouth., Disp: , Rfl:    Calcium Carbonate-Vitamin D 600-400 MG-UNIT tablet, Take by mouth., Disp: , Rfl:    Cyanocobalamin 1000 MCG TBCR, Take by mouth., Disp: , Rfl:    Dexlansoprazole (DEXILANT PO), Take by mouth daily., Disp: , Rfl:    diphenhydrAMINE (BENADRYL) 25 mg capsule, Take by mouth., Disp: , Rfl:    docusate sodium (COLACE) 100 MG capsule, Take 100 mg by mouth., Disp: , Rfl:    levothyroxine (SYNTHROID, LEVOTHROID) 50 MCG tablet, TAKE 1 TABLET (50 MCG TOTAL) BY MOUTH ONCE DAILY., Disp: , Rfl:    Omega-3 Fatty Acids (FISH OIL PO), Take by mouth., Disp: , Rfl:    pantoprazole (PROTONIX)  40 MG tablet, Take 40 mg by mouth 2 (two) times daily., Disp: , Rfl:    pravastatin (PRAVACHOL) 20 MG tablet, Take by mouth., Disp: , Rfl:    sodium chloride (OCEAN) 0.65 % nasal spray, Place into the nose., Disp: , Rfl:    Physical exam:  Vitals:   05/27/22 0930  BP: 128/75  Pulse: 61  Resp: 20  Temp: 97.6 F (36.4 C)  SpO2: 100%  Weight: 178 lb 6.4 oz (80.9 kg)   Physical Exam Cardiovascular:     Rate and Rhythm: Normal rate and regular rhythm.     Heart sounds: Normal heart sounds.  Pulmonary:     Effort: Pulmonary effort is normal.     Breath sounds: Normal breath sounds.  Abdominal:     General: Bowel sounds are normal.     Palpations: Abdomen is soft.  Skin:    General: Skin is warm and dry.  Neurological:     Mental Status: She is alert and oriented to person, place, and time.           Latest Ref Rng & Units 05/27/2022   10:34 AM  CMP  Glucose 70 - 99 mg/dL 89   BUN 8 - 23 mg/dL 7   Creatinine 1.30 - 8.65 mg/dL 7.84    Sodium 696 - 295 mmol/L 139   Potassium 3.5 - 5.1 mmol/L 4.2   Chloride 98 - 111 mmol/L 104   CO2 22 - 32 mmol/L 26   Calcium 8.9 - 10.3 mg/dL 8.7   Total Protein 6.5 - 8.1 g/dL 6.3   Total Bilirubin 0.3 - 1.2 mg/dL 2.1   Alkaline Phos 38 - 126 U/L 93   AST 15 - 41 U/L 55   ALT 0 - 44 U/L 67       Latest Ref Rng & Units 05/27/2022   10:34 AM  CBC  WBC 4.0 - 10.5 K/uL 5.2   Hemoglobin 12.0 - 15.0 g/dL 28.4   Hematocrit 13.2 - 46.0 % 46.7   Platelets 150 - 400 K/uL 177     No images are attached to the encounter.  MR LIVER W WO CONTRAST  Result Date: 05/23/2022 CLINICAL DATA:  Abnormal ultrasound performed for elevated liver function tests. Prior CT history includes melanoma. EXAM: MRI ABDOMEN WITHOUT AND WITH CONTRAST TECHNIQUE: Multiplanar multisequence MR imaging of the abdomen was performed both before and after the administration of intravenous contrast. CONTRAST:  8mL GADAVIST GADOBUTROL 1 MMOL/ML IV SOLN COMPARISON:  Ultrasound 05/09/2022 and the CT of 11/11/2020 FINDINGS: Lower chest: Mild cardiomegaly, without pericardial or pleural effusion. Hepatobiliary: The liver is entirely replaced by heterogeneous T2 signal and postcontrast enhancement, highly suspicious for metastatic disease. Individual lesions are difficult to measure secondary to diffuse infiltrative morphology. Example of diffuse restricted diffusion including on 81/7. Some lesions are precontrast T1 hyperintense on series 11. Example enhancing segment 4A lesion of 7 mm on 25/16 subtracted. Normal gallbladder, without biliary ductal dilatation. Pancreas:  Normal, without mass or ductal dilatation. Spleen:  Normal in size, without focal abnormality. Adrenals/Urinary Tract: Normal adrenal glands. Normal kidneys, without hydronephrosis. Stomach/Bowel: Normal stomach. Periampullary duodenal diverticulum. Otherwise normal small bowel. Large colonic stool burden. Vascular/Lymphatic: Aortic atherosclerosis. No retroperitoneal or  retrocrural adenopathy. Other:  No ascites. Musculoskeletal: Mildly heterogeneous marrow signal with foci of hyperenhancement including at 1.4 cm in the L5 vertebral body on 34/19. At L1 a 5 mm hyperenhancing lesion on 24/19. IMPRESSION: 1. Diffuse hepatic replacement with heterogeneous T2 signal and  enhancement, favoring metastatic disease. Given the clinical history and some areas of precontrast T1 hyperintensity, favor melanoma metastasis. This could either be sampled via ultrasound or PET performed to direct biopsy. 2. Heterogeneous marrow signal with areas of hyperenhancement, suspicious for osseous metastasis. 3.  Aortic Atherosclerosis (ICD10-I70.0). These results will be called to the ordering clinician or representative by the Radiologist Assistant, and communication documented in the PACS or [employer]. Electronically Signed   By: Jeronimo Greaves M.D.   On: 05/23/2022 16:12   US ABDOMEN LIMITED RUQ (LIVER/GB)  Result Date: 05/09/2022 CLINICAL DATA:  Elevated LFTs EXAM: ULTRASOUND ABDOMEN LIMITED RIGHT UPPER QUADRANT COMPARISON:  CT abdomen pelvis 11/11/2020 FINDINGS: Gallbladder: No gallstones or wall thickening visualized. No sonographic Murphy sign noted by sonographer. Common bile duct: Diameter: 5.3 mm Liver: Heterogeneous echogenicity of the liver with mildly nodular contour. Within the left hepatic lobe there is a 1.0 x 0.8 x 1.1 cm hypoechoic lesion, within the right hepatic lobe there is a 1.2 x 1.3 x 1.1 cm hypoechoic lesion and a 1.4 x 1.0 x 1.2 cm hypoechoic lesion. Portal vein is patent on color Doppler imaging with normal direction of blood flow towards the liver. Other: None. IMPRESSION: 1. Heterogeneous echogenicity of the liver with mildly nodular contour. Findings may reflect underlying cirrhosis. 2. There are a few hypoechoic lesions within the liver which are indeterminate. Recommend further evaluation with dedicated liver protocol MRI. 3. No cholelithiasis or sonographic evidence  for acute cholecystitis. 4. These results will be called to the ordering clinician or representative by the Radiologist Assistant, and communication documented in the PACS or [employer]. Electronically Signed   By: Annia Belt M.D.   On: 05/09/2022 09:23    Assessment and plan- Patient is a 77 y.o. female with history of choroidal melanoma in 2022 now presenting with findings concerning for liver metastases  Liver metastases: Possibly melanoma but other malignancies are also possible.  I will plan to get a whole-body PET CT scan and MRI brain with and without contrast.  I will plan for an ultrasound-guided liver biopsy.  I am checking CBC with differential CMP and LDH today.  I will see the patient back after PET scan and biopsy results are back to discuss further management   Thank you for this kind referral and the opportunity to participate in the care of this patient   Visit Diagnosis 1. Abnormal CT scan, liver   2. Liver mass   3. Bone mass   4. Malignant melanoma, unspecified site [facility])     Dr. Owens Shark, MD, MPH [facility] [phone] 05/27/2022 1:10 PM

## 2022-05-27 NOTE — Addendum Note (Signed)
Addended by: Ashley Royalty A on: 05/27/2022 02:51 PM   Modules accepted: Orders

## 2022-05-30 ENCOUNTER — Ambulatory Visit
Admission: RE | Admit: 2022-05-30 | Discharge: 2022-05-30 | Disposition: A | Discharge status: Home / Self Care | Payer: PPO | Source: Ambulatory Visit | Attending: Oncology | Admitting: Oncology

## 2022-05-30 DIAGNOSIS — R932 Abnormal findings on diagnostic imaging of liver and biliary tract: Secondary | ICD-10-CM

## 2022-05-30 DIAGNOSIS — J439 Emphysema, unspecified: Secondary | ICD-10-CM | POA: Diagnosis not present

## 2022-05-30 DIAGNOSIS — R160 Hepatomegaly, not elsewhere classified: Secondary | ICD-10-CM

## 2022-05-30 DIAGNOSIS — C439 Malignant melanoma of skin, unspecified: Secondary | ICD-10-CM | POA: Insufficient documentation

## 2022-05-30 DIAGNOSIS — C801 Malignant (primary) neoplasm, unspecified: Secondary | ICD-10-CM | POA: Diagnosis not present

## 2022-05-30 DIAGNOSIS — C787 Secondary malignant neoplasm of liver and intrahepatic bile duct: Secondary | ICD-10-CM | POA: Diagnosis not present

## 2022-05-30 DIAGNOSIS — I700 Atherosclerosis of aorta: Secondary | ICD-10-CM | POA: Diagnosis not present

## 2022-05-30 DIAGNOSIS — C7951 Secondary malignant neoplasm of bone: Secondary | ICD-10-CM | POA: Insufficient documentation

## 2022-05-30 DIAGNOSIS — M898X9 Other specified disorders of bone, unspecified site: Secondary | ICD-10-CM

## 2022-05-30 LAB — GLUCOSE, CAPILLARY: Glucose-Capillary: 88 mg/dL (ref 70–99)

## 2022-05-30 MED ORDER — FLUDEOXYGLUCOSE F - 18 (FDG) INJECTION
9.7700 | Freq: Once | INTRAVENOUS | Status: AC | PRN
  Administered 2022-05-30: 9.77 via INTRAVENOUS

## 2022-06-01 ENCOUNTER — Ambulatory Visit
Admission: RE | Admit: 2022-06-01 | Discharge: 2022-06-01 | Disposition: A | Discharge status: Home / Self Care | Payer: PPO | Source: Ambulatory Visit | Attending: Oncology | Admitting: Oncology

## 2022-06-01 DIAGNOSIS — C439 Malignant melanoma of skin, unspecified: Secondary | ICD-10-CM | POA: Insufficient documentation

## 2022-06-01 DIAGNOSIS — R932 Abnormal findings on diagnostic imaging of liver and biliary tract: Secondary | ICD-10-CM | POA: Diagnosis not present

## 2022-06-01 DIAGNOSIS — M898X9 Other specified disorders of bone, unspecified site: Secondary | ICD-10-CM | POA: Diagnosis not present

## 2022-06-01 DIAGNOSIS — R160 Hepatomegaly, not elsewhere classified: Secondary | ICD-10-CM | POA: Diagnosis not present

## 2022-06-01 DIAGNOSIS — Z0389 Encounter for observation for other suspected diseases and conditions ruled out: Secondary | ICD-10-CM | POA: Diagnosis not present

## 2022-06-01 MED ORDER — GADOBUTROL 1 MMOL/ML IV SOLN
8.0000 mL | Freq: Once | INTRAVENOUS | Status: AC | PRN
  Administered 2022-06-01: 8 mL via INTRAVENOUS

## 2022-06-07 ENCOUNTER — Encounter: Payer: Self-pay | Admitting: Oncology

## 2022-06-07 ENCOUNTER — Other Ambulatory Visit: Payer: Self-pay | Admitting: Internal Medicine

## 2022-06-07 ENCOUNTER — Other Ambulatory Visit: Payer: Self-pay | Admitting: *Deleted

## 2022-06-07 ENCOUNTER — Inpatient Hospital Stay (HOSPITAL_BASED_OUTPATIENT_CLINIC_OR_DEPARTMENT_OTHER): Payer: PPO | Admitting: Oncology

## 2022-06-07 VITALS — BP 134/75 | HR 57 | Temp 95.5°F | Resp 18 | Ht 65.0 in | Wt 176.1 lb

## 2022-06-07 DIAGNOSIS — R160 Hepatomegaly, not elsewhere classified: Secondary | ICD-10-CM

## 2022-06-07 DIAGNOSIS — C787 Secondary malignant neoplasm of liver and intrahepatic bile duct: Secondary | ICD-10-CM

## 2022-06-07 NOTE — Progress Notes (Signed)
Hematology/Oncology Consult note [facility]  [phone] [phone]  Patient Care Team: Marina Goodell, MD as PCP - General (Family Medicine)   Name of the patient: Christine Savage  [id number]  [birth year]   Date of visit: 06/07/22  Diagnosis-concern for liver metastases.  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  She is s/p I 125 plaque radiotherapy placed on 11/16/20 and removed 11/20/20 she sees Dr. Gwendalyn Ege from [facility] for the same.  Recently patient has been onIntraocular a Avastin injections as well.  Patient was seen by Dr. Servando Snare from GI for abnormal LFTs.As a part of his workup he had ordered an ultrasound abdomen which showed indeterminate hypoechoic lesions in the liver and MRI was recommended.  MRI liver with and without contrast showed diffuse hepatic replacement with heterogeneous T2 signal and enhancement favoring metastatic disease.  Given the clinical history some of these areas favor melanoma metastases.  Heterogenous marrow signal including a 1.4 cm L5 vertebral body lesion concerning for bone metastases.  Patient has been referred for further management   Family history significant for breast cancer in her paternal grandmother.  Father died of brain tumor.  Patient does not remember having melanoma involving her skin.  She states that about 35 years ago there was a lesion removed from the tip of her nose which was possibly cancer.  She lives with her husband and is independent of her ADLs and IADLs.  She reports mild low back pain but denies other complaints at this time  PET scan showed 2 discrete hypermetabolic liver masses with a background of diffusely heterogeneous liver parenchymal density and nodular contour which may represent pseudocirrhotic  appearance related to diffuse metastatic disease.  Faintly sclerotic L5 vertebral and proximal left humeral osseous metastases  Interval history-no acute issues since last visit.  She is here with her son today  ECOG PS- 1 Pain scale- 0   Review of systems- Review of Systems  Constitutional:  Negative for chills, fever, malaise/fatigue and weight loss.  HENT:  Negative for congestion, ear discharge and nosebleeds.   Eyes:  Negative for blurred vision.  Respiratory:  Negative for cough, hemoptysis, sputum production, shortness of breath and wheezing.   Cardiovascular:  Negative for chest pain, palpitations, orthopnea and claudication.  Gastrointestinal:  Negative for abdominal pain, blood in stool, constipation, diarrhea, heartburn, melena, nausea and vomiting.  Genitourinary:  Negative for dysuria, flank pain, frequency, hematuria and urgency.  Musculoskeletal:  Negative for back pain, joint pain and myalgias.  Skin:  Negative for rash.  Neurological:  Negative for dizziness, tingling, focal weakness, seizures, weakness and headaches.  Endo/Heme/Allergies:  Does not bruise/bleed easily.  Psychiatric/Behavioral:  Negative for depression and suicidal ideas. The patient does not have insomnia.       Allergies  Allergen Reactions   Latex Rash    From Bandaids   Sulfa Antibiotics Nausea Only     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HCC)    GERD (gastroesophageal reflux disease)    Hyperlipidemia    Hypothyroidism    Insomnia      Past Surgical History:  Procedure Laterality Date   ABDOMINAL HYSTERECTOMY     CATARACT EXTRACTION W/ INTRAOCULAR LENS  IMPLANT, BILATERAL     COLONOSCOPY WITH PROPOFOL N/A 12/17/2015  Procedure: COLONOSCOPY WITH PROPOFOL;  Surgeon: Midge Minium, MD;  Location: [facility];  Service: Endoscopy;  Laterality: N/A;   COLONOSCOPY WITH PROPOFOL N/A 02/23/2021   Procedure: COLONOSCOPY WITH PROPOFOL;  Surgeon: Midge Minium, MD;  Location: [facility] END[facility]COPY;  Service: Endoscopy;  Laterality: N/A;   COLONOSCOPY WITH PROPOFOL N/A 07/20/2021   Procedure: COLONOSCOPY WITH PROPOFOL;  Surgeon: Midge Minium, MD;  Location: [facility] END[facility]COPY;  Service: Endoscopy;  Laterality: N/A;   ESOPHAGOGASTRODUODENOSCOPY (EGD) WITH PROPOFOL N/A 12/17/2015   Procedure: ESOPHAGOGASTRODUODENOSCOPY (EGD) WITH PROPOFOL;  Surgeon: Midge Minium, MD;  Location: [facility];  Service: Endoscopy;  Laterality: N/A;  Latex sensitivity   EYE SURGERY     NASAL SINUS SURGERY     POLYPECTOMY N/A 12/17/2015   Procedure: POLYPECTOMY;  Surgeon: Midge Minium, MD;  Location: [facility];  Service: Endoscopy;  Laterality: N/A;   ROOT CANAL     TUBAL LIGATION      Social History   Socioeconomic History   Marital status: Married    Spouse name: Not on file   Number of children: Not on file   Years of education: Not on file   Highest education level: Not on file  Occupational History   Not on file  Tobacco Use   Smoking status: Never   Smokeless tobacco: Never  Vaping Use   Vaping Use: Never used  Substance and Sexual Activity   Alcohol use: No   Drug use: No   Sexual activity: Not Currently    Birth control/protection: None  Other Topics Concern   Not on file  Social History Narrative   Not on file   Social Determinants of Health   Financial Resource Strain: Not on file  Food Insecurity: Not on file  Transportation Needs: Not on file  Physical Activity: Not on file  Stress: Not on file  Social Connections: Not on file  Intimate Partner Violence: Not on file    Family History  Problem Relation Age of Onset   Coronary artery disease Mother    Cancer Father    Coronary artery disease Father    Osteoarthritis Brother    Cancer Brother      Current Outpatient Medications:    acetaminophen (TYLENOL) 650 MG CR tablet, Take 1,300 mg by mouth., Disp: , Rfl:    Calcium Carbonate-Vitamin D 600-400 MG-UNIT tablet,  Take by mouth., Disp: , Rfl:    Cyanocobalamin 1000 MCG TBCR, Take by mouth., Disp: , Rfl:    Dexlansoprazole (DEXILANT PO), Take by mouth daily., Disp: , Rfl:    diphenhydrAMINE (BENADRYL) 25 mg capsule, Take by mouth., Disp: , Rfl:    docusate sodium (COLACE) 100 MG capsule, Take 100 mg by mouth., Disp: , Rfl:    ibuprofen (ADVIL) 600 MG tablet, Take 600 mg by mouth 3 (three) times daily., Disp: , Rfl:    levothyroxine (SYNTHROID, LEVOTHROID) 50 MCG tablet, TAKE 1 TABLET (50 MCG TOTAL) BY MOUTH ONCE DAILY., Disp: , Rfl:    Omega-3 Fatty Acids (FISH OIL PO), Take by mouth., Disp: , Rfl:    pantoprazole (PROTONIX) 40 MG tablet, Take 40 mg by mouth 2 (two) times daily., Disp: , Rfl:    sodium chloride (OCEAN) 0.65 % nasal spray, Place into the nose., Disp: , Rfl:    pravastatin (PRAVACHOL) 20 MG tablet, Take by mouth., Disp: , Rfl:   Physical exam:  Vitals:   06/07/22 1018  BP: 134/75  Pulse: (!) 57  Resp: 18  Temp: (!)  95.5 F (35.3 C)  TempSrc: Tympanic  SpO2: 98%  Weight: 176 lb 1.6 oz (79.9 kg)  Height: 5\' 5"  (1.651 m)   Physical Exam Cardiovascular:     Rate and Rhythm: Normal rate and regular rhythm.     Heart sounds: Normal heart sounds.  Pulmonary:     Effort: Pulmonary effort is normal.     Breath sounds: Normal breath sounds.  Abdominal:     General: Bowel sounds are normal.     Palpations: Abdomen is soft.  Skin:    General: Skin is warm and dry.  Neurological:     Mental Status: She is alert and oriented to person, place, and time.         Latest Ref Rng & Units 05/27/2022   10:34 AM  CMP  Glucose 70 - 99 mg/dL 89   BUN 8 - 23 mg/dL 7   Creatinine 1.61 - 0.96 mg/dL 0.45   Sodium 409 - 811 mmol/L 139   Potassium 3.5 - 5.1 mmol/L 4.2   Chloride 98 - 111 mmol/L 104   CO2 22 - 32 mmol/L 26   Calcium 8.9 - 10.3 mg/dL 8.7   Total Protein 6.5 - 8.1 g/dL 6.3   Total Bilirubin 0.3 - 1.2 mg/dL 2.1   Alkaline Phos 38 - 126 U/L 93   AST 15 - 41 U/L 55   ALT 0 -  44 U/L 67       Latest Ref Rng & Units 05/27/2022   10:34 AM  CBC  WBC 4.0 - 10.5 K/uL 5.2   Hemoglobin 12.0 - 15.0 g/dL 91.4   Hematocrit 78.2 - 46.0 % 46.7   Platelets 150 - 400 K/uL 177     No images are attached to the encounter.  NM PET Image Initial (PI) Whole Body  Result Date: 06/02/2022 CLINICAL DATA:  Initial treatment strategy for choroidal melanoma treated in 2022, now with MRI findings suspicious for widespread metastatic disease to the liver and bones. EXAM: NUCLEAR MEDICINE PET WHOLE BODY TECHNIQUE: 9.8 mCi F-18 FDG was injected intravenously. Full-ring PET imaging was performed from the head to foot after the radiotracer. CT data was obtained and used for attenuation correction and anatomic localization. Fasting blood glucose: 88 mg/dl COMPARISON:  95/62/1308 MRI abdomen.  11/11/2020 CT abdomen/pelvis. FINDINGS: Mediastinal blood pool activity: SUV max 3.2 HEAD/NECK: No hypermetabolic activity in the scalp. No hypermetabolic cervical lymph nodes. Incidental CT findings: none CHEST: No enlarged or hypermetabolic axillary, mediastinal or hilar lymph nodes. No hypermetabolic pulmonary findings. Incidental CT findings: Coronary atherosclerosis. Atherosclerotic nonaneurysmal thoracic aorta. Mild bullous emphysema in the upper right lung. No significant pulmonary nodules. ABDOMEN/PELVIS: Diffusely heterogeneous liver parenchymal density with diffusely irregular liver surface. Focal hypermetabolic 1.3 cm anterior right liver dome mass with max SUV 9.8 (series 4/image 85). Focal hypermetabolic segment 5 right liver 1.6 cm mass with max SUV 8.2 (series 4/image 100). No abnormal hypermetabolic activity within the pancreas, adrenal glands, or spleen. No hypermetabolic lymph nodes in the abdomen or pelvis. Incidental CT findings: Atherosclerotic nonaneurysmal abdominal aorta. Hysterectomy. SKELETON: Hypermetabolic faintly sclerotic 1.1 cm L5 vertebral lesion just to the left of midline with max  SUV 6.6 (series 4/image 132). Hypermetabolic faintly sclerotic subcentimeter proximal left humeral metaphysis lesion with max SUV 2.9 (series 4/image 56). Incidental CT findings: none EXTREMITIES: No abnormal hypermetabolic activity in the lower extremities. Incidental CT findings: none IMPRESSION: 1. Two discrete hypermetabolic liver masses in the anterior right liver dome and segment 5 right  liver lobe, within a background of diffusely heterogeneous liver parenchymal density and diffusely nodular liver contour, which may represent a pseudo-cirrhotic appearance related to diffuse metastatic disease to the liver as suggested on the recent MRI abdomen study. 2. 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 3. No additional sites of hypermetabolic metastatic disease. 4. Aortic Atherosclerosis (ICD10-I70.0) and Emphysema (ICD10-J43.9). Electronically Signed   By: Delbert Phenix M.D.   On: 06/02/2022 10:11   MR LIVER W WO CONTRAST  Result Date: 05/23/2022 CLINICAL DATA:  Abnormal ultrasound performed for elevated liver function tests. Prior CT history includes melanoma. EXAM: MRI ABDOMEN WITHOUT AND WITH CONTRAST TECHNIQUE: Multiplanar multisequence MR imaging of the abdomen was performed both before and after the administration of intravenous contrast. CONTRAST:  8mL GADAVIST GADOBUTROL 1 MMOL/ML IV SOLN COMPARISON:  Ultrasound 05/09/2022 and the CT of 11/11/2020 FINDINGS: Lower chest: Mild cardiomegaly, without pericardial or pleural effusion. Hepatobiliary: The liver is entirely replaced by heterogeneous T2 signal and postcontrast enhancement, highly suspicious for metastatic disease. Individual lesions are difficult to measure secondary to diffuse infiltrative morphology. Example of diffuse restricted diffusion including on 81/7. Some lesions are precontrast T1 hyperintense on series 11. Example enhancing segment 4A lesion of 7 mm on 25/16 subtracted. Normal  gallbladder, without biliary ductal dilatation. Pancreas:  Normal, without mass or ductal dilatation. Spleen:  Normal in size, without focal abnormality. Adrenals/Urinary Tract: Normal adrenal glands. Normal kidneys, without hydronephrosis. Stomach/Bowel: Normal stomach. Periampullary duodenal diverticulum. Otherwise normal small bowel. Large colonic stool burden. Vascular/Lymphatic: Aortic atherosclerosis. No retroperitoneal or retrocrural adenopathy. Other:  No ascites. Musculoskeletal: Mildly heterogeneous marrow signal with foci of hyperenhancement including at 1.4 cm in the L5 vertebral body on 34/19. At L1 a 5 mm hyperenhancing lesion on 24/19. IMPRESSION: 1. Diffuse hepatic replacement with heterogeneous T2 signal and enhancement, favoring metastatic disease. Given the clinical history and some areas of precontrast T1 hyperintensity, favor melanoma metastasis. This could either be sampled via ultrasound or PET performed to direct biopsy. 2. Heterogeneous marrow signal with areas of hyperenhancement, suspicious for osseous metastasis. 3.  Aortic Atherosclerosis (ICD10-I70.0). These results will be called to the ordering clinician or representative by the Radiologist Assistant, and communication documented in the PACS or [employer]. Electronically Signed   By: Jeronimo Greaves M.D.   On: 05/23/2022 16:12   US ABDOMEN LIMITED RUQ (LIVER/GB)  Result Date: 05/09/2022 CLINICAL DATA:  Elevated LFTs EXAM: ULTRASOUND ABDOMEN LIMITED RIGHT UPPER QUADRANT COMPARISON:  CT abdomen pelvis 11/11/2020 FINDINGS: Gallbladder: No gallstones or wall thickening visualized. No sonographic Murphy sign noted by sonographer. Common bile duct: Diameter: 5.3 mm Liver: Heterogeneous echogenicity of the liver with mildly nodular contour. Within the left hepatic lobe there is a 1.0 x 0.8 x 1.1 cm hypoechoic lesion, within the right hepatic lobe there is a 1.2 x 1.3 x 1.1 cm hypoechoic lesion and a 1.4 x 1.0 x 1.2 cm hypoechoic  lesion. Portal vein is patent on color Doppler imaging with normal direction of blood flow towards the liver. Other: None. IMPRESSION: 1. Heterogeneous echogenicity of the liver with mildly nodular contour. Findings may reflect underlying cirrhosis. 2. There are a few hypoechoic lesions within the liver which are indeterminate. Recommend further evaluation with dedicated liver protocol MRI. 3. No cholelithiasis or sonographic evidence for acute cholecystitis. 4. These results will be called to the ordering clinician or representative by the Radiologist Assistant, and communication documented in the PACS or [employer]. Electronically Signed   By: Kenard Gower  Earlene Plater M.D.   On: 05/09/2022 09:23     Assessment and plan- Patient is a 77 y.o. female with history of choroidal melanoma in 2020 22 with findings concerning for liver metastases presently  I have reviewed PET CT scan images independently and discussed findings with the patient and her son.  Liver shows heterogeneous pseudocirrhotic appearance likely secondary to diffuse metastatic disease in the liver.  There were 2 areas of hypermetabolism notedWhich turned out from the background of diffuse abnormality.  1 of these liver lesions will be biopsied tomorrow.  There were also areas of hypermetabolic L5 vertebral and proximal left humeral osseous metastases noted.  I will tentatively see her back in 1 week after the biopsy results are back.  MRI brain with and without contrast was also done and the results are pending   Visit Diagnosis 1. Metastases to the liver [facility])      Dr. Owens Shark, MD, MPH [facility] [phone] 06/07/2022 12:56 PM

## 2022-06-08 ENCOUNTER — Ambulatory Visit
Admission: RE | Admit: 2022-06-08 | Discharge: 2022-06-08 | Disposition: A | Discharge status: Home / Self Care | Payer: PPO | Source: Ambulatory Visit | Attending: Oncology | Admitting: Oncology

## 2022-06-08 ENCOUNTER — Other Ambulatory Visit: Payer: Self-pay

## 2022-06-08 DIAGNOSIS — K7689 Other specified diseases of liver: Secondary | ICD-10-CM | POA: Diagnosis not present

## 2022-06-08 DIAGNOSIS — K769 Liver disease, unspecified: Secondary | ICD-10-CM | POA: Diagnosis not present

## 2022-06-08 DIAGNOSIS — M1990 Unspecified osteoarthritis, unspecified site: Secondary | ICD-10-CM | POA: Insufficient documentation

## 2022-06-08 DIAGNOSIS — R160 Hepatomegaly, not elsewhere classified: Secondary | ICD-10-CM | POA: Diagnosis not present

## 2022-06-08 DIAGNOSIS — E785 Hyperlipidemia, unspecified: Secondary | ICD-10-CM | POA: Insufficient documentation

## 2022-06-08 DIAGNOSIS — K219 Gastro-esophageal reflux disease without esophagitis: Secondary | ICD-10-CM | POA: Insufficient documentation

## 2022-06-08 DIAGNOSIS — C787 Secondary malignant neoplasm of liver and intrahepatic bile duct: Secondary | ICD-10-CM | POA: Diagnosis not present

## 2022-06-08 DIAGNOSIS — C6990 Malignant neoplasm of unspecified site of unspecified eye: Secondary | ICD-10-CM | POA: Insufficient documentation

## 2022-06-08 DIAGNOSIS — C439 Malignant melanoma of skin, unspecified: Secondary | ICD-10-CM | POA: Diagnosis not present

## 2022-06-08 LAB — CBC WITH DIFFERENTIAL/PLATELET
Abs Immature Granulocytes: 0.01 10*3/uL (ref 0.00–0.07)
Basophils Absolute: 0.1 10*3/uL (ref 0.0–0.1)
Basophils Relative: 1 %
Eosinophils Absolute: 0.2 10*3/uL (ref 0.0–0.5)
Eosinophils Relative: 4 %
HCT: 47.9 % — ABNORMAL HIGH (ref 36.0–46.0)
Hemoglobin: 15.6 g/dL — ABNORMAL HIGH (ref 12.0–15.0)
Immature Granulocytes: 0 %
Lymphocytes Relative: 36 %
Lymphs Abs: 1.6 10*3/uL (ref 0.7–4.0)
MCH: 30.4 pg (ref 26.0–34.0)
MCHC: 32.6 g/dL (ref 30.0–36.0)
MCV: 93.2 fL (ref 80.0–100.0)
Monocytes Absolute: 0.4 10*3/uL (ref 0.1–1.0)
Monocytes Relative: 9 %
Neutro Abs: 2.2 10*3/uL (ref 1.7–7.7)
Neutrophils Relative %: 50 %
Platelets: 165 10*3/uL (ref 150–400)
RBC: 5.14 MIL/uL — ABNORMAL HIGH (ref 3.87–5.11)
RDW: 13.5 % (ref 11.5–15.5)
WBC: 4.4 10*3/uL (ref 4.0–10.5)
nRBC: 0 % (ref 0.0–0.2)

## 2022-06-08 LAB — COMPREHENSIVE METABOLIC PANEL
ALT: 56 U/L — ABNORMAL HIGH (ref 0–44)
AST: 59 U/L — ABNORMAL HIGH (ref 15–41)
Albumin: 3.7 g/dL (ref 3.5–5.0)
Alkaline Phosphatase: 99 U/L (ref 38–126)
Anion gap: 7 (ref 5–15)
BUN: 8 mg/dL (ref 8–23)
CO2: 26 mmol/L (ref 22–32)
Calcium: 8.6 mg/dL — ABNORMAL LOW (ref 8.9–10.3)
Chloride: 108 mmol/L (ref 98–111)
Creatinine, Ser: 0.66 mg/dL (ref 0.44–1.00)
GFR, Estimated: >60 mL/min (ref >60)
Glucose, Bld: 100 mg/dL — ABNORMAL HIGH (ref 70–99)
Potassium: 4.9 mmol/L (ref 3.5–5.1)
Sodium: 141 mmol/L (ref 135–145)
Total Bilirubin: 2.4 mg/dL — ABNORMAL HIGH (ref 0.3–1.2)
Total Protein: 6.4 g/dL — ABNORMAL LOW (ref 6.5–8.1)

## 2022-06-08 LAB — PROTIME-INR
INR: 1.2 (ref 0.8–1.2)
Prothrombin Time: 14.9 seconds (ref 11.4–15.2)

## 2022-06-08 MED ORDER — MIDAZOLAM HCL 2 MG/2ML IJ SOLN
INTRAMUSCULAR | Status: AC
  Filled 2022-06-08: qty 2

## 2022-06-08 MED ORDER — LIDOCAINE HCL (PF) 1 % IJ SOLN
10.0000 mL | Freq: Once | INTRAMUSCULAR | Status: AC
  Administered 2022-06-08: 10 mL via INTRADERMAL
  Filled 2022-06-08: qty 10

## 2022-06-08 MED ORDER — MIDAZOLAM HCL 2 MG/2ML IJ SOLN
INTRAMUSCULAR | Status: AC | PRN
  Administered 2022-06-08: .5 mg via INTRAVENOUS

## 2022-06-08 MED ORDER — SODIUM CHLORIDE 0.9 % IV SOLN: INTRAVENOUS | Status: DC

## 2022-06-08 MED ORDER — HYDROCODONE-ACETAMINOPHEN 5-325 MG PO TABS: 1.0000 | ORAL_TABLET | ORAL | Status: DC | PRN

## 2022-06-08 MED ORDER — FENTANYL CITRATE (PF) 100 MCG/2ML IJ SOLN
INTRAMUSCULAR | Status: AC
  Filled 2022-06-08: qty 2

## 2022-06-08 MED ORDER — FENTANYL CITRATE (PF) 100 MCG/2ML IJ SOLN
INTRAMUSCULAR | Status: AC | PRN
  Administered 2022-06-08: 25 ug via INTRAVENOUS

## 2022-06-08 NOTE — Procedures (Signed)
  Procedure:  [place] core liver biopsy R lobe Preprocedure diagnosis: The encounter diagnosis was Liver mass. Postprocedure diagnosis: same EBL:    minimal Complications:   none immediate  See full dictation in [employer].  Thora Lance MD Main # [phone] Pager  [phone] Mobile [phone]

## 2022-06-08 NOTE — H&P (Signed)
Chief Complaint: Patient was seen in consultation today for liver lesions concerning for metastatic melanoma  Referring Physician(s): Rao,Archana C  Supervising Physician: Oley Balm  Patient Status: [facility] - Out-pt  History of Present Illness: Christine Savage is a 77 y.o. 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 The patient was initially diagnosed with choroidal melanoma in 2022. Recent imaging revealed lesions of the liver concerning for metastatic disease. Patient was referred to Dr Smith Robert from Oncology for management, and patient was also referred to IR for image-guided liver biopsy to further evaluate her liver lesions.   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HCC)    GERD (gastroesophageal reflux disease)    Hyperlipidemia    Hypothyroidism    Insomnia     Past Surgical History:  Procedure Laterality Date   ABDOMINAL HYSTERECTOMY     CATARACT EXTRACTION W/ INTRAOCULAR LENS  IMPLANT, BILATERAL     COLONOSCOPY WITH PROPOFOL N/A 12/17/2015   Procedure: COLONOSCOPY WITH PROPOFOL;  Surgeon: Midge Minium, MD;  Location: [facility];  Service: Endoscopy;  Laterality: N/A;   COLONOSCOPY WITH PROPOFOL N/A 02/23/2021   Procedure: COLONOSCOPY WITH PROPOFOL;  Surgeon: Midge Minium, MD;  Location: [facility] END[facility]COPY;  Service: Endoscopy;  Laterality: N/A;   COLONOSCOPY WITH PROPOFOL N/A 07/20/2021   Procedure: COLONOSCOPY WITH PROPOFOL;  Surgeon: Midge Minium, MD;  Location: [facility] ENDOSCOPY;  Service: Endoscopy;  Laterality: N/A;   ESOPHAGOGASTRODUODENOSCOPY (EGD) WITH PROPOFOL N/A 12/17/2015   Procedure: ESOPHAGOGASTRODUODENOSCOPY (EGD) WITH PROPOFOL;  Surgeon: Midge Minium, MD;  Location: [facility];  Service: Endoscopy;  Laterality: N/A;  Latex sensitivity   EYE SURGERY     NASAL SINUS SURGERY     POLYPECTOMY N/A  12/17/2015   Procedure: POLYPECTOMY;  Surgeon: Midge Minium, MD;  Location: [facility];  Service: Endoscopy;  Laterality: N/A;   ROOT CANAL     TUBAL LIGATION      Allergies: Latex and Sulfa antibiotics  Medications: Prior to Admission medications   Medication Sig Start Date End Date Taking? Authorizing Provider  acetaminophen (TYLENOL) 650 MG CR tablet Take 1,300 mg by mouth.   Yes [provider]  Dexlansoprazole (DEXILANT PO) Take by mouth daily.   Yes [provider]  docusate sodium (COLACE) 100 MG capsule Take 100 mg by mouth.   Yes [provider]  ibuprofen (ADVIL) 600 MG tablet Take 600 mg by mouth 3 (three) times daily. 02/01/22  Yes [provider]  levothyroxine (SYNTHROID, LEVOTHROID) 50 MCG tablet TAKE 1 TABLET (50 MCG TOTAL) BY MOUTH ONCE DAILY. 06/29/15  Yes [provider]  pantoprazole (PROTONIX) 40 MG tablet Take 40 mg by mouth 2 (two) times daily. 12/20/20  Yes [provider]  Calcium Carbonate-Vitamin D 600-400 MG-UNIT tablet Take by mouth. Patient not taking: Reported on 06/08/2022    [provider]  Cyanocobalamin 1000 MCG TBCR Take by mouth. Patient not taking: Reported on 06/08/2022    [provider]  diphenhydrAMINE (BENADRYL) 25 mg capsule Take by mouth. Patient not taking: Reported on 06/08/2022    [provider]  Omega-3 Fatty Acids (FISH OIL PO) Take by mouth. Patient not taking: Reported on 06/08/2022    [provider]  pravastatin (PRAVACHOL) 20 MG tablet Take by mouth. 06/02/15 05/27/22  [provider]  sodium chloride (OCEAN) 0.65 % nasal spray Place into  the nose. Patient not taking: Reported on 06/08/2022    [provider]     Family History  Problem Relation Age of Onset   Coronary artery disease Mother    Cancer Father    Coronary artery disease Father    Osteoarthritis Brother    Cancer Brother     Social History    Socioeconomic History   Marital status: Married    Spouse name: Not on file   Number of children: Not on file   Years of education: Not on file   Highest education level: Not on file  Occupational History   Not on file  Tobacco Use   Smoking status: Never   Smokeless tobacco: Never  Vaping Use   Vaping Use: Never used  Substance and Sexual Activity   Alcohol use: No   Drug use: No   Sexual activity: Not Currently    Birth control/protection: None  Other Topics Concern   Not on file  Social History Narrative   Not on file   Social Determinants of Health   Financial Resource Strain: Not on file  Food Insecurity: Not on file  Transportation Needs: Not on file  Physical Activity: Not on file  Stress: Not on file  Social Connections: Not on file    Code Status: Full code  Review of Systems: A 12 point ROS discussed and pertinent positives are indicated in the HPI above.  All other systems are negative.  Review of Systems  Constitutional:  Negative for chills and fever.  Respiratory:  Negative for chest tightness and shortness of breath.   Cardiovascular:  Positive for leg swelling. Negative for chest pain.  Gastrointestinal:  Positive for abdominal pain. Negative for diarrhea, nausea and vomiting.  Neurological:  Negative for dizziness and headaches.  Psychiatric/Behavioral:  Negative for confusion.     Vital Signs: BP (!) 147/65   Pulse 64   Resp 12   Ht 5\' 5"  (1.651 m)   Wt 177 lb (80.3 kg)   SpO2 93%   BMI 29.45 kg/m   Advance Care Plan: The advanced care plan/surrogate decision maker was discussed at the time of visit and documented in the medical record.    Physical Exam Vitals reviewed.  Constitutional:      General: She is not in acute distress. Cardiovascular:     Rate and Rhythm: Normal rate and regular rhythm.     Pulses: Normal pulses.     Heart sounds: Normal heart sounds.  Pulmonary:     Effort: Pulmonary effort is normal.     Breath  sounds: Normal breath sounds.  Abdominal:     Palpations: Abdomen is soft.     Tenderness: There is no abdominal tenderness.  Musculoskeletal:     Right lower leg: Edema present.     Left lower leg: Edema present.     Comments: 1+ pitting edema  Skin:    General: Skin is warm and dry.  Neurological:     Mental Status: She is alert and oriented to person, place, and time.  Psychiatric:        Mood and Affect: Mood normal.        Behavior: Behavior normal.        Thought Content: Thought content normal.        Judgment: Judgment normal.     Imaging: NM PET Image Initial (PI) Whole Body  Result Date: 06/02/2022 CLINICAL DATA:  Initial treatment strategy for choroidal melanoma treated in 2022, now  with MRI findings suspicious for widespread metastatic disease to the liver and bones. EXAM: NUCLEAR MEDICINE PET WHOLE BODY TECHNIQUE: 9.8 mCi F-18 FDG was injected intravenously. Full-ring PET imaging was performed from the head to foot after the radiotracer. CT data was obtained and used for attenuation correction and anatomic localization. Fasting blood glucose: 88 mg/dl COMPARISON:  16/10/9602 MRI abdomen.  11/11/2020 CT abdomen/pelvis. FINDINGS: Mediastinal blood pool activity: SUV max 3.2 HEAD/NECK: No hypermetabolic activity in the scalp. No hypermetabolic cervical lymph nodes. Incidental CT findings: none CHEST: No enlarged or hypermetabolic axillary, mediastinal or hilar lymph nodes. No hypermetabolic pulmonary findings. Incidental CT findings: Coronary atherosclerosis. Atherosclerotic nonaneurysmal thoracic aorta. Mild bullous emphysema in the upper right lung. No significant pulmonary nodules. ABDOMEN/PELVIS: Diffusely heterogeneous liver parenchymal density with diffusely irregular liver surface. Focal hypermetabolic 1.3 cm anterior right liver dome mass with max SUV 9.8 (series 4/image 85). Focal hypermetabolic segment 5 right liver 1.6 cm mass with max SUV 8.2 (series 4/image 100). No  abnormal hypermetabolic activity within the pancreas, adrenal glands, or spleen. No hypermetabolic lymph nodes in the abdomen or pelvis. Incidental CT findings: Atherosclerotic nonaneurysmal abdominal aorta. Hysterectomy. SKELETON: Hypermetabolic faintly sclerotic 1.1 cm L5 vertebral lesion just to the left of midline with max SUV 6.6 (series 4/image 132). Hypermetabolic faintly sclerotic subcentimeter proximal left humeral metaphysis lesion with max SUV 2.9 (series 4/image 56). Incidental CT findings: none EXTREMITIES: No abnormal hypermetabolic activity in the lower extremities. Incidental CT findings: none IMPRESSION: 1. Two discrete hypermetabolic liver masses in the anterior right liver dome and segment 5 right liver lobe, within a background of diffusely heterogeneous liver parenchymal density and diffusely nodular liver contour, which may represent a pseudo-cirrhotic appearance related to diffuse metastatic disease to the liver as suggested on the recent MRI abdomen study. 2. 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 3. No additional sites of hypermetabolic metastatic disease. 4. Aortic Atherosclerosis (ICD10-I70.0) and Emphysema (ICD10-J43.9). Electronically Signed   By: Delbert Phenix M.D.   On: 06/02/2022 10:11   MR LIVER W WO CONTRAST  Result Date: 05/23/2022 CLINICAL DATA:  Abnormal ultrasound performed for elevated liver function tests. Prior CT history includes melanoma. EXAM: MRI ABDOMEN WITHOUT AND WITH CONTRAST TECHNIQUE: Multiplanar multisequence MR imaging of the abdomen was performed both before and after the administration of intravenous contrast. CONTRAST:  8mL GADAVIST GADOBUTROL 1 MMOL/ML IV SOLN COMPARISON:  Ultrasound 05/09/2022 and the CT of 11/11/2020 FINDINGS: Lower chest: Mild cardiomegaly, without pericardial or pleural effusion. Hepatobiliary: The liver is entirely replaced by heterogeneous T2 signal and postcontrast enhancement,  highly suspicious for metastatic disease. Individual lesions are difficult to measure secondary to diffuse infiltrative morphology. Example of diffuse restricted diffusion including on 81/7. Some lesions are precontrast T1 hyperintense on series 11. Example enhancing segment 4A lesion of 7 mm on 25/16 subtracted. Normal gallbladder, without biliary ductal dilatation. Pancreas:  Normal, without mass or ductal dilatation. Spleen:  Normal in size, without focal abnormality. Adrenals/Urinary Tract: Normal adrenal glands. Normal kidneys, without hydronephrosis. Stomach/Bowel: Normal stomach. Periampullary duodenal diverticulum. Otherwise normal small bowel. Large colonic stool burden. Vascular/Lymphatic: Aortic atherosclerosis. No retroperitoneal or retrocrural adenopathy. Other:  No ascites. Musculoskeletal: Mildly heterogeneous marrow signal with foci of hyperenhancement including at 1.4 cm in the L5 vertebral body on 34/19. At L1 a 5 mm hyperenhancing lesion on 24/19. IMPRESSION: 1. Diffuse hepatic replacement with heterogeneous T2 signal and enhancement, favoring metastatic disease. Given the clinical history and some areas of precontrast T1 hyperintensity, favor  melanoma metastasis. This could either be sampled via ultrasound or PET performed to direct biopsy. 2. Heterogeneous marrow signal with areas of hyperenhancement, suspicious for osseous metastasis. 3.  Aortic Atherosclerosis (ICD10-I70.0). These results will be called to the ordering clinician or representative by the Radiologist Assistant, and communication documented in the PACS or [employer]. Electronically Signed   By: Jeronimo Greaves M.D.   On: 05/23/2022 16:12    Labs:  CBC: Recent Labs    05/27/22 1034  WBC 5.2  HGB 15.3*  HCT 46.7*  PLT 177    COAGS: No results for input(s): "INR", "APTT" in the last 8760 hours.  BMP: Recent Labs    05/27/22 1034  NA 139  K 4.2  CL 104  CO2 26  GLUCOSE 89  BUN 7*  CALCIUM 8.7*   CREATININE 0.85  GFRNONAA >60    LIVER FUNCTION TESTS: Recent Labs    05/05/22 1330 05/27/22 1034  BILITOT 1.1 2.1*  AST 45* 55*  ALT 60* 67*  ALKPHOS 115 93  PROT 5.4* 6.3*  ALBUMIN 4.0 3.8    TUMOR MARKERS: No results for input(s): "AFPTM", "CEA", "CA199", "CHROMGRNA" in the last 8760 hours.  Assessment and Plan:  Christine Savage is a 77 yo female being seen today in relation to recently discovered liver lesions concerning for metastatic melanoma. Patient has been referred to IR for image-guided liver biopsy. Case was reviewed and approved by Dr Loreta Ave and is scheduled to proceed on 06/08/22 with Dr Deanne Coffer. Patient presents today in her usual state of health and is NPO.  Risks and benefits of image-guided liver biopsy was discussed with the patient and/or patient's family including, but not limited to bleeding, infection, damage to adjacent structures or low yield requiring additional tests.  All of the questions were answered and there is agreement to proceed.  Consent signed and in chart.   Thank you for this interesting consult.  I greatly enjoyed meeting Christine Savage and look forward to participating in their care.  A copy of this report was sent to the requesting provider on this date.  Electronically Signed: Kennieth Francois, PA-C 06/08/2022, 9:41 AM   I spent a total of  30 Minutes   in face to face in clinical consultation, greater than 50% of which was counseling/coordinating care for liver lesions concerning for metastatic melanoma.

## 2022-06-10 ENCOUNTER — Other Ambulatory Visit: Payer: Self-pay | Admitting: Pathology

## 2022-06-14 ENCOUNTER — Encounter: Payer: Self-pay | Admitting: Oncology

## 2022-06-14 ENCOUNTER — Telehealth: Payer: Self-pay | Admitting: *Deleted

## 2022-06-14 ENCOUNTER — Other Ambulatory Visit: Payer: Self-pay | Admitting: *Deleted

## 2022-06-14 ENCOUNTER — Inpatient Hospital Stay: Payer: PPO | Attending: Oncology | Admitting: Oncology

## 2022-06-14 VITALS — BP 132/77 | HR 62 | Temp 96.3°F | Resp 18 | Ht 65.0 in | Wt 175.2 lb

## 2022-06-14 DIAGNOSIS — M549 Dorsalgia, unspecified: Secondary | ICD-10-CM | POA: Diagnosis not present

## 2022-06-14 DIAGNOSIS — K5900 Constipation, unspecified: Secondary | ICD-10-CM | POA: Diagnosis not present

## 2022-06-14 DIAGNOSIS — Z79899 Other long term (current) drug therapy: Secondary | ICD-10-CM | POA: Insufficient documentation

## 2022-06-14 DIAGNOSIS — Z7189 Other specified counseling: Secondary | ICD-10-CM

## 2022-06-14 DIAGNOSIS — C787 Secondary malignant neoplasm of liver and intrahepatic bile duct: Secondary | ICD-10-CM | POA: Insufficient documentation

## 2022-06-14 DIAGNOSIS — R7989 Other specified abnormal findings of blood chemistry: Secondary | ICD-10-CM | POA: Diagnosis not present

## 2022-06-14 DIAGNOSIS — C6941 Malignant neoplasm of right ciliary body: Secondary | ICD-10-CM | POA: Diagnosis not present

## 2022-06-14 DIAGNOSIS — M25559 Pain in unspecified hip: Secondary | ICD-10-CM | POA: Insufficient documentation

## 2022-06-14 DIAGNOSIS — C7951 Secondary malignant neoplasm of bone: Secondary | ICD-10-CM | POA: Insufficient documentation

## 2022-06-14 DIAGNOSIS — Z85820 Personal history of malignant melanoma of skin: Secondary | ICD-10-CM | POA: Insufficient documentation

## 2022-06-14 DIAGNOSIS — Z5112 Encounter for antineoplastic immunotherapy: Secondary | ICD-10-CM | POA: Insufficient documentation

## 2022-06-14 MED ORDER — LIDOCAINE-PRILOCAINE 2.5-2.5 % EX CREA
TOPICAL_CREAM | CUTANEOUS | 3 refills | Status: DC
Start: 2022-06-14 — End: 2022-10-05

## 2022-06-14 MED ORDER — ONDANSETRON HCL 8 MG PO TABS
8.0000 mg | ORAL_TABLET | Freq: Three times a day (TID) | ORAL | 1 refills | Status: DC | PRN
Start: 2022-06-14 — End: 2022-09-09

## 2022-06-14 MED ORDER — PROCHLORPERAZINE MALEATE 10 MG PO TABS
10.0000 mg | ORAL_TABLET | Freq: Four times a day (QID) | ORAL | 1 refills | Status: DC | PRN
Start: 2022-06-14 — End: 2022-09-09

## 2022-06-14 NOTE — Telephone Encounter (Signed)
Faxed the info to pathology so the will send block for testing [employer] testing

## 2022-06-14 NOTE — Progress Notes (Signed)
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  She is s/p I 125 plaque radiotherapy placed on 11/16/20 and removed 11/20/20 she sees Dr. Gwendalyn Ege from [facility] for the same.  Recently patient has been onIntraocular a Avastin injections as well.  Patient was seen by Dr. Servando Snare from GI for abnormal LFTs.As a part of his workup he had ordered an ultrasound abdomen which showed indeterminate hypoechoic lesions in the liver and MRI was recommended.  MRI liver with and without contrast showed diffuse hepatic replacement with heterogeneous T2 signal and enhancement favoring metastatic disease.  Given the clinical history some of these areas favor melanoma metastases.  Heterogenous marrow signal including a 1.4 cm L5 vertebral body lesion concerning for bone metastases.  Patient has been referred for further management   Family history significant for breast cancer in her paternal grandmother.  Father died of brain tumor.  Patient does not remember having melanoma involving her skin.  She states that about 35 years ago there was a lesion removed from the tip of her nose which was possibly cancer.  She lives with her husband and is independent of her ADLs and IADLs.  She reports mild low back pain but denies other complaints at this time   PET scan showed 2 discrete hypermetabolic liver masses with a background of diffusely heterogeneous liver parenchymal density and nodular contour which may represent pseudocirrhotic appearance related to  diffuse metastatic disease.  Faintly sclerotic L5 vertebral and proximal left humeral osseous metastases   Ultrasound-guided liver biopsy to target the hypermetabolic liver masses was attempted but since those masses were not clearly visible on the ultrasound random right-sided liver biopsies were taken and was consistent with metastatic malignant melanoma.  NGS testing and BRAF testing is currently pending.  Interval history-patient is here with her son today.  She lives in [place] with her husband.  Her husband cannot drive and she has to rely on her son's to bring her for appointments.  ECOG PS- 1 Pain scale- 0 Opioid associated constipation- no  Review of systems- Review of Systems  Constitutional:  Positive for malaise/fatigue. Negative for chills, fever and weight loss.  HENT:  Negative for congestion, ear discharge and nosebleeds.   Eyes:  Negative for blurred vision.  Respiratory:  Negative for cough, hemoptysis, sputum production, shortness of breath and wheezing.   Cardiovascular:  Negative for chest pain, palpitations, orthopnea and claudication.  Gastrointestinal:  Negative for abdominal pain, blood in stool, constipation, diarrhea, heartburn, melena, nausea and vomiting.  Genitourinary:  Negative for dysuria, flank pain, frequency, hematuria and urgency.  Musculoskeletal:  Negative for back pain, joint pain and myalgias.  Skin:  Negative for rash.  Neurological:  Negative for dizziness, tingling, focal weakness, seizures, weakness and headaches.  Endo/Heme/Allergies:  Does not bruise/bleed easily.  Psychiatric/Behavioral:  Negative for depression and suicidal ideas. The patient does not have insomnia.       Allergies  Allergen Reactions   Latex Rash    From Bandaids   Sulfa Antibiotics Nausea Only     Past Medical History:  Diagnosis  Date   Arthritis    Eye cancer (HCC)    GERD (gastroesophageal reflux disease)    Hyperlipidemia    Hypothyroidism    Insomnia       Past Surgical History:  Procedure Laterality Date   ABDOMINAL HYSTERECTOMY     CATARACT EXTRACTION W/ INTRAOCULAR LENS  IMPLANT, BILATERAL     COLONOSCOPY WITH PROPOFOL N/A 12/17/2015   Procedure: COLONOSCOPY WITH PROPOFOL;  Surgeon: Midge Minium, MD;  Location: [facility];  Service: Endoscopy;  Laterality: N/A;   COLONOSCOPY WITH PROPOFOL N/A 02/23/2021   Procedure: COLONOSCOPY WITH PROPOFOL;  Surgeon: Midge Minium, MD;  Location: [facility] END[facility];  Service: Endoscopy;  Laterality: N/A;   COLONOSCOPY WITH PROPOFOL N/A 07/20/2021   Procedure: COLONOSCOPY WITH PROPOFOL;  Surgeon: Midge Minium, MD;  Location: [facility];  Service: Endoscopy;  Laterality: N/A;   ESOPHAGOGASTRODUODENOSCOPY (EGD) WITH PROPOFOL N/A 12/17/2015   Procedure: ESOPHAGOGASTRODUODENOSCOPY (EGD) WITH PROPOFOL;  Surgeon: Midge Minium, MD;  Location: [facility];  Service: Endoscopy;  Laterality: N/A;  Latex sensitivity   EYE SURGERY     NASAL SINUS SURGERY     POLYPECTOMY N/A 12/17/2015   Procedure: POLYPECTOMY;  Surgeon: Midge Minium, MD;  Location: [facility];  Service: Endoscopy;  Laterality: N/A;   ROOT CANAL     TUBAL LIGATION      Social History   Socioeconomic History   Marital status: Married    Spouse name: Not on file   Number of children: Not on file   Years of education: Not on file   Highest education level: Not on file  Occupational History   Not on file  Tobacco Use   Smoking status: Never   Smokeless tobacco: Never  Vaping Use   Vaping Use: Never used  Substance and Sexual Activity   Alcohol use: No   Drug use: No   Sexual activity: Not Currently    Birth control/protection: None  Other Topics Concern   Not on file  Social History Narrative   Not on file   Social Determinants of Health   Financial Resource Strain: Not on file  Food Insecurity: Not on file  Transportation Needs: Not on file  Physical Activity: Not on file  Stress: Not on file  Social  Connections: Not on file  Intimate Partner Violence: Not on file    Family History  Problem Relation Age of Onset   Coronary artery disease Mother    Cancer Father    Coronary artery disease Father    Osteoarthritis Brother    Cancer Brother      Current Outpatient Medications:    acetaminophen (TYLENOL) 650 MG CR tablet, Take 1,300 mg by mouth., Disp: , Rfl:    Dexlansoprazole (DEXILANT PO), Take by mouth daily., Disp: , Rfl:    docusate sodium (COLACE) 100 MG capsule, Take 100 mg by mouth., Disp: , Rfl:    ibuprofen (ADVIL) 600 MG tablet, Take 600 mg by mouth 3 (three) times daily., Disp: , Rfl:    levothyroxine (SYNTHROID, LEVOTHROID) 50 MCG tablet, TAKE 1 TABLET (50 MCG TOTAL) BY MOUTH ONCE DAILY., Disp: , Rfl:    pantoprazole (PROTONIX) 40 MG tablet, Take 40 mg by mouth 2 (two) times daily., Disp: , Rfl:    Calcium Carbonate-Vitamin D 600-400 MG-UNIT tablet, Take by mouth. (Patient not taking: Reported on 06/08/2022), Disp: , Rfl:    Cyanocobalamin 1000 MCG TBCR, Take by mouth. (Patient not taking: Reported on 06/08/2022), Disp: , Rfl:  diphenhydrAMINE (BENADRYL) 25 mg capsule, Take by mouth. (Patient not taking: Reported on 06/08/2022), Disp: , Rfl:    lidocaine-prilocaine (EMLA) cream, Apply to affected area once, Disp: 30 g, Rfl: 3   Omega-3 Fatty Acids (FISH OIL PO), Take by mouth. (Patient not taking: Reported on 06/08/2022), Disp: , Rfl:    ondansetron (ZOFRAN) 8 MG tablet, Take 1 tablet (8 mg total) by mouth every 8 (eight) hours as needed for nausea or vomiting., Disp: 30 tablet, Rfl: 1   pravastatin (PRAVACHOL) 20 MG tablet, Take by mouth., Disp: , Rfl:    prochlorperazine (COMPAZINE) 10 MG tablet, Take 1 tablet (10 mg total) by mouth every 6 (six) hours as needed for nausea or vomiting., Disp: 30 tablet, Rfl: 1   sodium chloride (OCEAN) 0.65 % nasal spray, Place into the nose. (Patient not taking: Reported on 06/08/2022), Disp: , Rfl:   Physical exam:  Vitals:   06/14/22  1018  BP: 132/77  Pulse: 62  Resp: 18  Temp: (!) 96.3 F (35.7 C)  TempSrc: Tympanic  SpO2: 100%  Weight: 175 lb 3.2 oz (79.5 kg)  Height: 5\' 5"  (1.651 m)   Physical Exam Cardiovascular:     Rate and Rhythm: Normal rate and regular rhythm.     Heart sounds: Normal heart sounds.  Pulmonary:     Effort: Pulmonary effort is normal.     Breath sounds: Normal breath sounds.  Abdominal:     General: Bowel sounds are normal.     Palpations: Abdomen is soft.  Skin:    General: Skin is warm and dry.  Neurological:     Mental Status: She is alert and oriented to person, place, and time.         Latest Ref Rng & Units 06/08/2022    9:22 AM  CMP  Glucose 70 - 99 mg/dL 130   BUN 8 - 23 mg/dL 8   Creatinine 8.65 - 7.84 mg/dL 6.96   Sodium 295 - 284 mmol/L 141   Potassium 3.5 - 5.1 mmol/L 4.9   Chloride 98 - 111 mmol/L 108   CO2 22 - 32 mmol/L 26   Calcium 8.9 - 10.3 mg/dL 8.6   Total Protein 6.5 - 8.1 g/dL 6.4   Total Bilirubin 0.3 - 1.2 mg/dL 2.4   Alkaline Phos 38 - 126 U/L 99   AST 15 - 41 U/L 59   ALT 0 - 44 U/L 56       Latest Ref Rng & Units 06/08/2022    9:22 AM  CBC  WBC 4.0 - 10.5 K/uL 4.4   Hemoglobin 12.0 - 15.0 g/dL 13.2   Hematocrit 44.0 - 46.0 % 47.9   Platelets 150 - 400 K/uL 165       US BIOPSY (LIVER)  Result Date: 06/08/2022 CLINICAL DATA:  Multiple liver lesions. EXAM: ULTRASOUND-GUIDED CORE LIVER BIOPSY TECHNIQUE: An ultrasound guided liver biopsy was thoroughly discussed with the patient and questions were answered. The benefits, risks, alternatives, and complications were also discussed. The patient understands and wishes to proceed with the procedure. A verbal as well as written consent was obtained. Survey ultrasound of the liver was performed. The 2 hypermetabolic lesion seen on PET-CT could not be differentiated from innumerable abnormalities throughout both lobes. Therefore, an appropriate skin entry site was determined for approach to  representative right-sided lesions. Skin site was marked, prepped with chlorhexidine, and draped in usual sterile fashion, and infiltrated locally with 1% lidocaine. A 17 gauge trocar needle was  advanced under ultrasound guidance into the liver. 2 solid-appearing variegated coaxial 18gauge core samples were then obtained through the guide needle. The guide needle was removed. Post procedure scans demonstrate no apparent complication. COMPLICATIONS: COMPLICATIONS None immediate FINDINGS: Multiple poorly defined lesions throughout both hepatic lobes. Representative core biopsy of right lobe lesions obtained. IMPRESSION: Technically successful ultrasound guided core needle biopsy of right hepatic lesions. Electronically Signed   By: Corlis Leak M.D.   On: 06/08/2022 14:06   MR Brain W Wo Contrast  Result Date: 06/08/2022 CLINICAL DATA:  liver mass, bone mass. History of choroidal melanoma. EXAM: MRI HEAD WITHOUT AND WITH CONTRAST TECHNIQUE: Multiplanar, multiecho pulse sequences of the brain and surrounding structures were obtained without and with intravenous contrast. CONTRAST:  8mL GADAVIST GADOBUTROL 1 MMOL/ML IV SOLN COMPARISON:  None Available. FINDINGS: Brain: No acute infarct or hemorrhage. No hydrocephalus or extra-axial collection. No foci of abnormal susceptibility. No mass or abnormal enhancement. Vascular: Normal flow voids and vessel enhancement. Skull and upper cervical spine: Normal marrow signal and enhancement. Sinuses/Orbits: Unremarkable. Other: None. IMPRESSION: No evidence of intracranial metastatic disease. Electronically Signed   By: Orvan Falconer M.D.   On: 06/08/2022 09:33   NM PET Image Initial (PI) Whole Body  Result Date: 06/02/2022 CLINICAL DATA:  Initial treatment strategy for choroidal melanoma treated in 2022, now with MRI findings suspicious for widespread metastatic disease to the liver and bones. EXAM: NUCLEAR MEDICINE PET WHOLE BODY TECHNIQUE: 9.8 mCi F-18 FDG was injected  intravenously. Full-ring PET imaging was performed from the head to foot after the radiotracer. CT data was obtained and used for attenuation correction and anatomic localization. Fasting blood glucose: 88 mg/dl COMPARISON:  84/69/6295 MRI abdomen.  11/11/2020 CT abdomen/pelvis. FINDINGS: Mediastinal blood pool activity: SUV max 3.2 HEAD/NECK: No hypermetabolic activity in the scalp. No hypermetabolic cervical lymph nodes. Incidental CT findings: none CHEST: No enlarged or hypermetabolic axillary, mediastinal or hilar lymph nodes. No hypermetabolic pulmonary findings. Incidental CT findings: Coronary atherosclerosis. Atherosclerotic nonaneurysmal thoracic aorta. Mild bullous emphysema in the upper right lung. No significant pulmonary nodules. ABDOMEN/PELVIS: Diffusely heterogeneous liver parenchymal density with diffusely irregular liver surface. Focal hypermetabolic 1.3 cm anterior right liver dome mass with max SUV 9.8 (series 4/image 85). Focal hypermetabolic segment 5 right liver 1.6 cm mass with max SUV 8.2 (series 4/image 100). No abnormal hypermetabolic activity within the pancreas, adrenal glands, or spleen. No hypermetabolic lymph nodes in the abdomen or pelvis. Incidental CT findings: Atherosclerotic nonaneurysmal abdominal aorta. Hysterectomy. SKELETON: Hypermetabolic faintly sclerotic 1.1 cm L5 vertebral lesion just to the left of midline with max SUV 6.6 (series 4/image 132). Hypermetabolic faintly sclerotic subcentimeter proximal left humeral metaphysis lesion with max SUV 2.9 (series 4/image 56). Incidental CT findings: none EXTREMITIES: No abnormal hypermetabolic activity in the lower extremities. Incidental CT findings: none IMPRESSION: 1. Two discrete hypermetabolic liver masses in the anterior right liver dome and segment 5 right liver lobe, within a background of diffusely heterogeneous liver parenchymal density and diffusely nodular liver contour, which may represent a pseudo-cirrhotic  appearance related to diffuse metastatic disease to the liver as suggested on the recent MRI abdomen study. 2. 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 3. No additional sites of hypermetabolic metastatic disease. 4. Aortic Atherosclerosis (ICD10-I70.0) and Emphysema (ICD10-J43.9). Electronically Signed   By: Delbert Phenix M.D.   On: 06/02/2022 10:11   MR LIVER W WO CONTRAST  Result Date: 05/23/2022 CLINICAL DATA:  Abnormal ultrasound performed for elevated liver  function tests. Prior CT history includes melanoma. EXAM: MRI ABDOMEN WITHOUT AND WITH CONTRAST TECHNIQUE: Multiplanar multisequence MR imaging of the abdomen was performed both before and after the administration of intravenous contrast. CONTRAST:  8mL GADAVIST GADOBUTROL 1 MMOL/ML IV SOLN COMPARISON:  Ultrasound 05/09/2022 and the CT of 11/11/2020 FINDINGS: Lower chest: Mild cardiomegaly, without pericardial or pleural effusion. Hepatobiliary: The liver is entirely replaced by heterogeneous T2 signal and postcontrast enhancement, highly suspicious for metastatic disease. Individual lesions are difficult to measure secondary to diffuse infiltrative morphology. Example of diffuse restricted diffusion including on 81/7. Some lesions are precontrast T1 hyperintense on series 11. Example enhancing segment 4A lesion of 7 mm on 25/16 subtracted. Normal gallbladder, without biliary ductal dilatation. Pancreas:  Normal, without mass or ductal dilatation. Spleen:  Normal in size, without focal abnormality. Adrenals/Urinary Tract: Normal adrenal glands. Normal kidneys, without hydronephrosis. Stomach/Bowel: Normal stomach. Periampullary duodenal diverticulum. Otherwise normal small bowel. Large colonic stool burden. Vascular/Lymphatic: Aortic atherosclerosis. No retroperitoneal or retrocrural adenopathy. Other:  No ascites. Musculoskeletal: Mildly heterogeneous marrow signal with foci of hyperenhancement including  at 1.4 cm in the L5 vertebral body on 34/19. At L1 a 5 mm hyperenhancing lesion on 24/19. IMPRESSION: 1. Diffuse hepatic replacement with heterogeneous T2 signal and enhancement, favoring metastatic disease. Given the clinical history and some areas of precontrast T1 hyperintensity, favor melanoma metastasis. This could either be sampled via ultrasound or PET performed to direct biopsy. 2. Heterogeneous marrow signal with areas of hyperenhancement, suspicious for osseous metastasis. 3.  Aortic Atherosclerosis (ICD10-I70.0). These results will be called to the ordering clinician or representative by the Radiologist Assistant, and communication documented in the PACS or [employer]. Electronically Signed   By: Jeronimo Greaves M.D.   On: 05/23/2022 16:12     Assessment and plan- Patient is a 77 y.o. female with metastatic uveal melanoma with liver metastases here to discuss further management  Patient had a known choroidal melanoma involving her right eye in November 2022.  She has not had any known cutaneous melanoma.  She now has diffuse liver involvement with melanoma which is likely metastases from her uveal melanoma and this is not a cutaneous melanoma.  Liver metastases is the most common site from a uveal melanoma.  She has mildly elevated bilirubin of 2.4 as well as mildly elevated AST and ALT.  MRI brain is negative for metastatic disease.As long as you are okay with that it is not like what I am giving you is not recommended discussed that generally speaking uveal melanoma Scarry a poor prognosis as compared to cutaneous melanomas.  She will need HLA genotype assessment for HLA-A*02.01.  If she is positive for the same then drugs like Tebentafusp can be used.  However these agents can only be given at tertiary centers and sometimes the first couple of doses may have to be given and patient due to risk of cytokine release syndrome.  I did recommend getting a second opinion at [facility] or [facility] given the  rarity of uveal melanoma's.  However patient does have significant transportation issues and does not wish to travel to [facility] or [place].  She wishes to get her treatment here at [facility].  We are unable to offer her Tebentafusp even if she has HLA gene positivity.  I will be checking for it regardless.  She has diffuse liver involvement and therefore I do not think that surgery or liver directed therapy would be an option for her especially in the context of elevated  bilirubin.  I will discuss her case at tumor board  Patient does not have significant extrahepatic disease in the sense that she has hypermetabolism in her L5 and left humeral meta physis.However in the absence of surgery and liver directed therapy I would recommend systemic therapy with combination nivolumab and ipilimumab.  I would recommend nivolumab at 3 mg/kg and ipilimumab 1 mg/kg given better tolerance as compared to dosing vice versa. Observational and phase II studies suggest some limited activity for this combination as initial therapy in patients with uveal melanoma, with response rates of up to 18 percent, median PFS of up to six months, and median OS of up to 19 months discussed risks and benefits of immunotherapy including all but not limited to autoimmune side effects such as colitis pneumonitis endocrinopathies and need to monitor liver and kidney functions.  Treatment will be given with a palliative intent.  Patient understands and agrees to proceed as planned.  I will see her next week for start of cycle 1.  She will get a port placement prior as well as chemo teach.  I have updated Dr. Gwendalyn Ege when her right final surgeon from [facility] as well   Cancer Staging  Melanoma metastatic to liver [facility]) Staging form: Melanoma of the Skin, AJCC 8th Edition - Clinical stage from 06/14/2022: Stage IV (cTX, cNX, pM1c(0)) - Signed by Creig Hines, MD on 06/14/2022     Visit Diagnosis 1. 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)    2. Goals of care, counseling/discussion   3. 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 Owens Shark, MD, MPH [facility] at [facility] [phone] 06/14/2022 4:21 PM

## 2022-06-14 NOTE — Progress Notes (Signed)
START ON PATHWAY REGIMEN - Melanoma and Other Skin Cancers     Cycles 1 through 4: A cycle is every 21 days:     Nivolumab      Ipilimumab    Cycles 5 and beyond: A cycle is every 28 days:     Nivolumab   **Always confirm dose/schedule in your pharmacy ordering system**  Patient Characteristics: Melanoma, Uveal, Distant Metastases, Systemic Therapy Indicated, HLA-A*02:01 Negative/Unknown, Eligible for Immunotherapy Disease Classification: Melanoma Disease Subtype: Uveal BRAF V600 Mutation Status: Awaiting BRAF V600 Results Current evidence of distant metastases<= Yes HLA-A*02:01 Mutation Status: Unknown Intent of Therapy: Non-Curative / Palliative Intent, Discussed with Patient

## 2022-06-15 ENCOUNTER — Telehealth: Payer: Self-pay | Admitting: *Deleted

## 2022-06-15 ENCOUNTER — Other Ambulatory Visit: Payer: Self-pay

## 2022-06-15 ENCOUNTER — Other Ambulatory Visit: Payer: Self-pay | Admitting: *Deleted

## 2022-06-15 ENCOUNTER — Other Ambulatory Visit: Payer: Self-pay | Admitting: Student

## 2022-06-15 DIAGNOSIS — C787 Secondary malignant neoplasm of liver and intrahepatic bile duct: Secondary | ICD-10-CM

## 2022-06-15 NOTE — Telephone Encounter (Signed)
Patient called and requests a call back to discuss the fact that she wants to get treatment at [facility], She needs you to get her an appointment, and to cancel the port placement and treatment here.

## 2022-06-15 NOTE — Telephone Encounter (Signed)
Spoke to Christine Savage today and he was good with port insertion and start treatment next week. I got a port insertion for tom. Gave him the date and times and NPO for 8 hours. Through out the day Christine Savage reached out to pt. Ad the son and wanted to be sure about second opinion at [facility] with a melanoma MD and some things they can do that we can't. They wanted to get appt with [facility]  but worried about taking her there every time she needs to go and then said that they will get chemo here and if we do not hear from son or pt. Go ahead with the plan here. We called again and the pt. Wants to get second opinion, Christine Savage called son back and they are now wanting have sec. Opinion. So we cancelled port and treatment ans faxed a referral to [facility] to dr. Dr. Tonia Ghent Savage. Smith Savage also sent email to him about this pt. The fax went through to [facility].

## 2022-06-16 ENCOUNTER — Telehealth: Payer: Self-pay | Admitting: Oncology

## 2022-06-16 ENCOUNTER — Ambulatory Visit
Admission: RE | Admit: 2022-06-16 | Discharge: 2022-06-16 | Disposition: A | Discharge status: Home / Self Care | Payer: PPO | Source: Ambulatory Visit | Attending: Oncology | Admitting: Oncology

## 2022-06-16 ENCOUNTER — Other Ambulatory Visit: Payer: PPO

## 2022-06-16 NOTE — Telephone Encounter (Signed)
Patient left a voicemail to talk about cancelling today and Tuesday appointments to get an appointment at [facility]- please call- [phone]

## 2022-06-17 ENCOUNTER — Encounter: Payer: Self-pay | Admitting: Oncology

## 2022-06-20 ENCOUNTER — Telehealth: Payer: Self-pay | Admitting: *Deleted

## 2022-06-20 ENCOUNTER — Other Ambulatory Visit: Payer: Self-pay

## 2022-06-20 DIAGNOSIS — D485 Neoplasm of uncertain behavior of skin: Secondary | ICD-10-CM | POA: Diagnosis not present

## 2022-06-20 NOTE — Telephone Encounter (Signed)
Called [facility] pharmacy to cancel prescription for EMLA cream and Ondansetron.  Patient has chosen to go elsewhere.

## 2022-06-21 ENCOUNTER — Ambulatory Visit: Payer: PPO | Admitting: Oncology

## 2022-06-21 ENCOUNTER — Other Ambulatory Visit: Payer: PPO

## 2022-06-21 ENCOUNTER — Ambulatory Visit: Payer: PPO

## 2022-06-24 ENCOUNTER — Encounter: Payer: Self-pay | Admitting: Oncology

## 2022-06-24 ENCOUNTER — Telehealth: Payer: Self-pay | Admitting: *Deleted

## 2022-06-24 ENCOUNTER — Other Ambulatory Visit: Payer: Self-pay | Admitting: Oncology

## 2022-06-24 DIAGNOSIS — C6930 Malignant neoplasm of unspecified choroid: Secondary | ICD-10-CM | POA: Diagnosis not present

## 2022-06-24 DIAGNOSIS — C787 Secondary malignant neoplasm of liver and intrahepatic bile duct: Secondary | ICD-10-CM | POA: Diagnosis not present

## 2022-06-24 LAB — SURGICAL PATHOLOGY

## 2022-06-24 NOTE — Telephone Encounter (Signed)
Call from Nurse Navigator with Dr Exie Parody to report that patient is to come back to [facility] for treatment with IPI/Nivo and needs a referral to radiation therapy to treat the bone lesion. Patient wants to start treatment ASAP and does not need a port to get started. Please arrange appointments asap and if you need to speak with Gunnar Fusi, she left her office and pager numbers which are attached to this note.

## 2022-06-24 NOTE — Telephone Encounter (Signed)
Request for port a cath has been faxed over to janet on 6/14./24 at 10:36 am to janet.

## 2022-06-25 ENCOUNTER — Other Ambulatory Visit: Payer: Self-pay

## 2022-06-28 ENCOUNTER — Inpatient Hospital Stay: Payer: PPO

## 2022-06-29 ENCOUNTER — Encounter: Payer: Self-pay | Admitting: Internal Medicine

## 2022-06-29 ENCOUNTER — Inpatient Hospital Stay: Payer: PPO

## 2022-06-29 ENCOUNTER — Inpatient Hospital Stay (HOSPITAL_BASED_OUTPATIENT_CLINIC_OR_DEPARTMENT_OTHER): Payer: PPO | Admitting: Internal Medicine

## 2022-06-29 VITALS — BP 140/79 | HR 71

## 2022-06-29 DIAGNOSIS — C787 Secondary malignant neoplasm of liver and intrahepatic bile duct: Secondary | ICD-10-CM

## 2022-06-29 DIAGNOSIS — C6941 Malignant neoplasm of right ciliary body: Secondary | ICD-10-CM

## 2022-06-29 DIAGNOSIS — Z5112 Encounter for antineoplastic immunotherapy: Secondary | ICD-10-CM | POA: Diagnosis not present

## 2022-06-29 LAB — CBC WITH DIFFERENTIAL (CANCER CENTER ONLY)
Abs Immature Granulocytes: 0.01 10*3/uL (ref 0.00–0.07)
Basophils Absolute: 0.1 10*3/uL (ref 0.0–0.1)
Basophils Relative: 1 %
Eosinophils Absolute: 0.2 10*3/uL (ref 0.0–0.5)
Eosinophils Relative: 4 %
HCT: 45.3 % (ref 36.0–46.0)
Hemoglobin: 14.9 g/dL (ref 12.0–15.0)
Immature Granulocytes: 0 %
Lymphocytes Relative: 31 %
Lymphs Abs: 1.5 10*3/uL (ref 0.7–4.0)
MCH: 30.8 pg (ref 26.0–34.0)
MCHC: 32.9 g/dL (ref 30.0–36.0)
MCV: 93.8 fL (ref 80.0–100.0)
Monocytes Absolute: 0.4 10*3/uL (ref 0.1–1.0)
Monocytes Relative: 9 %
Neutro Abs: 2.8 10*3/uL (ref 1.7–7.7)
Neutrophils Relative %: 55 %
Platelet Count: 160 10*3/uL (ref 150–400)
RBC: 4.83 MIL/uL (ref 3.87–5.11)
RDW: 13.7 % (ref 11.5–15.5)
WBC Count: 5 10*3/uL (ref 4.0–10.5)
nRBC: 0 % (ref 0.0–0.2)

## 2022-06-29 LAB — CMP (CANCER CENTER ONLY)
ALT: 67 U/L — ABNORMAL HIGH (ref 0–44)
AST: 62 U/L — ABNORMAL HIGH (ref 15–41)
Albumin: 3.7 g/dL (ref 3.5–5.0)
Alkaline Phosphatase: 108 U/L (ref 38–126)
Anion gap: 8 (ref 5–15)
BUN: 11 mg/dL (ref 8–23)
CO2: 27 mmol/L (ref 22–32)
Calcium: 8.6 mg/dL — ABNORMAL LOW (ref 8.9–10.3)
Chloride: 103 mmol/L (ref 98–111)
Creatinine: 0.86 mg/dL (ref 0.44–1.00)
GFR, Estimated: >60 mL/min (ref >60)
Glucose, Bld: 113 mg/dL — ABNORMAL HIGH (ref 70–99)
Potassium: 3.8 mmol/L (ref 3.5–5.1)
Sodium: 138 mmol/L (ref 135–145)
Total Bilirubin: 1.9 mg/dL — ABNORMAL HIGH (ref 0.3–1.2)
Total Protein: 6.3 g/dL — ABNORMAL LOW (ref 6.5–8.1)

## 2022-06-29 LAB — TSH: TSH: 3.585 u[IU]/mL (ref 0.350–4.500)

## 2022-06-29 MED ORDER — SODIUM CHLORIDE 0.9 % IV SOLN
240.0000 mg | Freq: Once | INTRAVENOUS | Status: AC
  Administered 2022-06-29: 240 mg via INTRAVENOUS
  Filled 2022-06-29: qty 24

## 2022-06-29 MED ORDER — SODIUM CHLORIDE 0.9 % IV SOLN
1.0000 mg/kg | Freq: Once | INTRAVENOUS | Status: AC
  Administered 2022-06-29: 80 mg via INTRAVENOUS
  Filled 2022-06-29: qty 16

## 2022-06-29 MED ORDER — FAMOTIDINE IN NACL 20-0.9 MG/50ML-% IV SOLN
20.0000 mg | Freq: Once | INTRAVENOUS | Status: AC
  Administered 2022-06-29: 20 mg via INTRAVENOUS
  Filled 2022-06-29: qty 50

## 2022-06-29 MED ORDER — DIPHENHYDRAMINE HCL 50 MG/ML IJ SOLN
25.0000 mg | Freq: Once | INTRAMUSCULAR | Status: AC
  Administered 2022-06-29: 25 mg via INTRAVENOUS
  Filled 2022-06-29: qty 1

## 2022-06-29 MED ORDER — SODIUM CHLORIDE 0.9 % IV SOLN
Freq: Once | INTRAVENOUS | Status: AC
  Filled 2022-06-29: qty 250

## 2022-06-29 NOTE — Patient Instructions (Signed)
[place] [facility]  Discharge Instructions: Thank you for choosing [facility] to provide your oncology and hematology care.  If you have a lab appointment with the Cancer Center, please go directly to the Cancer Center and check in at the registration area.  Wear comfortable clothing and clothing appropriate for easy access to any Portacath or PICC line.   We strive to give you quality time with your provider. You may need to reschedule your appointment if you arrive late (15 or more minutes).  Arriving late affects you and other patients whose appointments are after yours.  Also, if you miss three or more appointments without notifying the office, you may be dismissed from the clinic at the provider's discretion.      For prescription refill requests, have your pharmacy contact our office and allow 72 hours for refills to be completed.    Today you received the following chemotherapy and/or immunotherapy agents: Opdivo, Arthur Holms.   To help prevent nausea and vomiting after your treatment, we encourage you to take your nausea medication as directed.  BELOW ARE SYMPTOMS THAT SHOULD BE REPORTED IMMEDIATELY: *FEVER GREATER THAN 100.4 F (38 C) OR HIGHER *CHILLS OR SWEATING *NAUSEA AND VOMITING THAT IS NOT CONTROLLED WITH YOUR NAUSEA MEDICATION *UNUSUAL SHORTNESS OF BREATH *UNUSUAL BRUISING OR BLEEDING *URINARY PROBLEMS (pain or burning when urinating, or frequent urination) *BOWEL PROBLEMS (unusual diarrhea, constipation, pain near the anus) TENDERNESS IN MOUTH AND THROAT WITH OR WITHOUT PRESENCE OF ULCERS (sore throat, sores in mouth, or a toothache) UNUSUAL RASH, SWELLING OR PAIN  UNUSUAL VAGINAL DISCHARGE OR ITCHING   Items with * indicate a potential emergency and should be followed up as soon as possible or go to the Emergency Department if any problems should occur.  Please show the CHEMOTHERAPY ALERT CARD or IMMUNOTHERAPY ALERT CARD at check-in  to the Emergency Department and triage nurse.  Should you have questions after your visit or need to cancel or reschedule your appointment, please contact [place] [facility]  [phone] and follow the prompts.  Office hours are 8:00 a.m. to 4:30 p.m. Monday - Friday. Please note that voicemails left after 4:00 p.m. may not be returned until the following business day.  We are closed weekends and major holidays. You have access to a nurse at all times for urgent questions. Please call the main number to the clinic [phone] and follow the prompts.  For any non-urgent questions, you may also contact your provider using MyChart. We now offer e-Visits for anyone 37 and older to request care online for non-urgent symptoms. For details visit [email or web address].   Also download the MyChart app! Go to the app store, search "MyChart", open the app, select [place], and log in with your MyChart username and password.

## 2022-06-29 NOTE — Progress Notes (Signed)
[facility] CONSULT NOTE  Patient Care Team: Marina Goodell, MD as PCP - General (Family Medicine)  CHIEF COMPLAINTS/PURPOSE OF CONSULTATION:  Melanoma- uveal-   HISTORY OF PRESENTING ILLNESS: Patient ambulating-independently. Alone.    Christine Savage 77 y.o.  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  Patient complains of mild back pain/hip pain.  She is taking Tylenol as needed.  However this getting worse.  No nausea no vomiting.  No abdominal pain chest pain shortness of breath or cough.   Review of Systems  Constitutional:  Positive for malaise/fatigue. Negative for chills, diaphoresis, fever and weight loss.  HENT:  Negative for nosebleeds and sore throat.   Eyes:  Negative for double vision.  Respiratory:  Negative for cough, hemoptysis, sputum production, shortness of breath and wheezing.   Cardiovascular:  Negative for chest pain, palpitations, orthopnea and leg swelling.  Gastrointestinal:  Negative for abdominal pain, blood in stool, constipation, diarrhea, heartburn, melena, nausea and vomiting.  Genitourinary:  Negative for dysuria, frequency and urgency.  Musculoskeletal:  Positive for back pain. Negative for joint pain.  Skin: Negative.  Negative for itching and rash.  Neurological:  Negative for dizziness, tingling, focal weakness, weakness and headaches.  Endo/Heme/Allergies:  Does not bruise/bleed easily.  Psychiatric/Behavioral:  Negative for depression. The patient is not nervous/anxious and does not have insomnia.     MEDICAL HISTORY:  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HCC)    GERD (gastroesophageal reflux disease)    Hyperlipidemia    Hypothyroidism    Insomnia     SURGICAL HISTORY: Past Surgical History:  Procedure Laterality Date   ABDOMINAL HYSTERECTOMY     CATARACT EXTRACTION W/ INTRAOCULAR LENS  IMPLANT, BILATERAL     COLONOSCOPY WITH  PROPOFOL N/A 12/17/2015   Procedure: COLONOSCOPY WITH PROPOFOL;  Surgeon: Midge Minium, MD;  Location: [facility];  Service: Endoscopy;  Laterality: N/A;   COLONOSCOPY WITH PROPOFOL N/A 02/23/2021   Procedure: COLONOSCOPY WITH PROPOFOL;  Surgeon: Midge Minium, MD;  Location: [facility];  Service: Endoscopy;  Laterality: N/A;   COLONOSCOPY WITH PROPOFOL N/A 07/20/2021   Procedure: COLONOSCOPY WITH PROPOFOL;  Surgeon: Midge Minium, MD;  Location: [facility] ENDOSCOPY;  Service: Endoscopy;  Laterality: N/A;   ESOPHAGOGASTRODUODENOSCOPY (EGD) WITH PROPOFOL N/A 12/17/2015   Procedure: ESOPHAGOGASTRODUODENOSCOPY (EGD) WITH PROPOFOL;  Surgeon: Midge Minium, MD;  Location: [facility];  Service: Endoscopy;  Laterality: N/A;  Latex sensitivity   EYE SURGERY     NASAL SINUS SURGERY     POLYPECTOMY N/A 12/17/2015   Procedure: POLYPECTOMY;  Surgeon: Midge Minium, MD;  Location: [facility];  Service: Endoscopy;  Laterality: N/A;   ROOT CANAL     TUBAL LIGATION      SOCIAL HISTORY: Social History   Socioeconomic History   Marital status: Married    Spouse name: Not on file   Number of children: Not on file   Years of education: Not on file   Highest education level: Not on file  Occupational History   Not on file  Tobacco Use   Smoking status: Never   Smokeless tobacco: Never  Vaping Use   Vaping Use: Never used  Substance and Sexual Activity   Alcohol use: No   Drug use: No   Sexual activity: Not Currently    Birth control/protection: None  Other Topics Concern   Not on file  Social History Narrative  Not on file   Social Determinants of Health   Financial Resource Strain: Not on file  Food Insecurity: Not on file  Transportation Needs: Not on file  Physical Activity: Not on file  Stress: Not on file  Social Connections: Not on file  Intimate Partner Violence: Not on file    FAMILY HISTORY: Family History  Problem Relation Age of Onset   Coronary artery  disease Mother    Cancer Father    Coronary artery disease Father    Osteoarthritis Brother    Cancer Brother     ALLERGIES:  is allergic to latex and sulfa antibiotics.  MEDICATIONS:  Current Outpatient Medications  Medication Sig Dispense Refill   acetaminophen (TYLENOL) 650 MG CR tablet Take 1,300 mg by mouth.     Calcium Carbonate-Vitamin D 600-400 MG-UNIT tablet Take by mouth.     Cyanocobalamin 1000 MCG TBCR Take by mouth.     Dexlansoprazole (DEXILANT PO) Take by mouth daily.     diphenhydrAMINE (BENADRYL) 25 mg capsule Take by mouth.     docusate sodium (COLACE) 100 MG capsule Take 100 mg by mouth.     ibuprofen (ADVIL) 600 MG tablet Take 600 mg by mouth 3 (three) times daily.     levothyroxine (SYNTHROID, LEVOTHROID) 50 MCG tablet TAKE 1 TABLET (50 MCG TOTAL) BY MOUTH ONCE DAILY.     lidocaine-prilocaine (EMLA) cream Apply to affected area once 30 g 3   Omega-3 Fatty Acids (FISH OIL PO) Take by mouth.     ondansetron (ZOFRAN) 8 MG tablet Take 1 tablet (8 mg total) by mouth every 8 (eight) hours as needed for nausea or vomiting. 30 tablet 1   pantoprazole (PROTONIX) 40 MG tablet Take 40 mg by mouth 2 (two) times daily.     prochlorperazine (COMPAZINE) 10 MG tablet Take 1 tablet (10 mg total) by mouth every 6 (six) hours as needed for nausea or vomiting. 30 tablet 1   sodium chloride (OCEAN) 0.65 % nasal spray Place into the nose.     pravastatin (PRAVACHOL) 20 MG tablet Take by mouth.     No current facility-administered medications for this visit.    PHYSICAL EXAMINATION:   Vitals:   06/29/22 0836  BP: (!) 142/79  Pulse: 67  Resp: 18  Temp: (!) 96.9 F (36.1 C)  SpO2: 97%   Filed Weights   06/29/22 0836  Weight: 174 lb 6.4 oz (79.1 kg)    Physical Exam Vitals and nursing note reviewed.  HENT:     Head: Normocephalic and atraumatic.     Mouth/Throat:     Pharynx: Oropharynx is clear.  Eyes:     Extraocular Movements: Extraocular movements intact.      Pupils: Pupils are equal, round, and reactive to light.  Cardiovascular:     Rate and Rhythm: Normal rate and regular rhythm.  Pulmonary:     Comments: Decreased breath sounds bilaterally.  Abdominal:     Palpations: Abdomen is soft.  Musculoskeletal:        General: Normal range of motion.     Cervical back: Normal range of motion.  Skin:    General: Skin is warm.  Neurological:     General: No focal deficit present.     Mental Status: She is alert and oriented to person, place, and time.  Psychiatric:        Behavior: Behavior normal.        Judgment: Judgment normal.     LABORATORY DATA:  I have reviewed the data as listed Lab Results  Component Value Date   WBC 5.0 06/29/2022   HGB 14.9 06/29/2022   HCT 45.3 06/29/2022   MCV 93.8 06/29/2022   PLT 160 06/29/2022   Recent Labs    05/05/22 1330 05/27/22 1034 06/08/22 0922 06/29/22 0820  NA  --  139 141 138  K  --  4.2 4.9 3.8  CL  --  104 108 103  CO2  --  26 26 27   GLUCOSE  --  89 100* 113*  BUN  --  7* 8 11  CREATININE  --  0.85 0.66 0.86  CALCIUM  --  8.7* 8.6* 8.6*  GFRNONAA  --  >60 >60 >60  PROT 5.4* 6.3* 6.4* 6.3*  ALBUMIN 4.0 3.8 3.7 3.7  AST 45* 55* 59* 62*  ALT 60* 67* 56* 67*  ALKPHOS 115 93 99 108  BILITOT 1.1 2.1* 2.4* 1.9*  BILIDIR 0.31  --   --   --     RADIOGRAPHIC STUDIES: I have personally reviewed the radiological images as listed and agreed with the findings in the report. US BIOPSY (LIVER)  Result Date: 06/08/2022 CLINICAL DATA:  Multiple liver lesions. EXAM: ULTRASOUND-GUIDED CORE LIVER BIOPSY TECHNIQUE: An ultrasound guided liver biopsy was thoroughly discussed with the patient and questions were answered. The benefits, risks, alternatives, and complications were also discussed. The patient understands and wishes to proceed with the procedure. A verbal as well as written consent was obtained. Survey ultrasound of the liver was performed. The 2 hypermetabolic lesion seen on PET-CT  could not be differentiated from innumerable abnormalities throughout both lobes. Therefore, an appropriate skin entry site was determined for approach to representative right-sided lesions. Skin site was marked, prepped with chlorhexidine, and draped in usual sterile fashion, and infiltrated locally with 1% lidocaine. A 17 gauge trocar needle was advanced under ultrasound guidance into the liver. 2 solid-appearing variegated coaxial 18gauge core samples were then obtained through the guide needle. The guide needle was removed. Post procedure scans demonstrate no apparent complication. COMPLICATIONS: COMPLICATIONS None immediate FINDINGS: Multiple poorly defined lesions throughout both hepatic lobes. Representative core biopsy of right lobe lesions obtained. IMPRESSION: Technically successful ultrasound guided core needle biopsy of right hepatic lesions. Electronically Signed   By: Corlis Leak M.D.   On: 06/08/2022 14:06   MR Brain W Wo Contrast  Result Date: 06/08/2022 CLINICAL DATA:  liver mass, bone mass. History of choroidal melanoma. EXAM: MRI HEAD WITHOUT AND WITH CONTRAST TECHNIQUE: Multiplanar, multiecho pulse sequences of the brain and surrounding structures were obtained without and with intravenous contrast. CONTRAST:  8mL GADAVIST GADOBUTROL 1 MMOL/ML IV SOLN COMPARISON:  None Available. FINDINGS: Brain: No acute infarct or hemorrhage. No hydrocephalus or extra-axial collection. No foci of abnormal susceptibility. No mass or abnormal enhancement. Vascular: Normal flow voids and vessel enhancement. Skull and upper cervical spine: Normal marrow signal and enhancement. Sinuses/Orbits: Unremarkable. Other: None. IMPRESSION: No evidence of intracranial metastatic disease. Electronically Signed   By: Orvan Falconer M.D.   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  MRI brain is negative for metastatic disease; HLA-A*02.01- pending.  # Proceed with cycle #1 of  nivolumab at 3 mg/kg and ipilimumab 1 mg/kg. Labs-CBC/chemistries were reviewed with the patient.  Slightly elevated bilirubin-monitor for now.  Again reviewed the potential side effects/mechanism  of action of immunotherapy.  Patient instructed to call us if she has any diarrhea or skin rash or any other abnormal side effects.  # Elevated LFTs- sec to Metastatic to melanoma-monitor closely on immunotherapy.  # Bone metastases- consider palliative RT if pain worse; current ly on Tylenol prn. ? Zometa- defer to dr.Rao.   # DISPOSITION:  # treatment today # follow up 1 week- APP; labs- cbc/cmp # follow up with Dr.Rao as planned- Dr.B  Above plan of care was discussed with patient/family in detail.  My contact information was given to the patient/family.     Earna Coder, MD 06/29/2022 4:05 PM

## 2022-06-29 NOTE — Progress Notes (Signed)
Ipilimumab (YERVOY) Patient Monitoring Assessment   Is the patient experiencing any of the following general symptoms?:  [ ] Difficulty performing normal activities [ ] Feeling sluggish or cold all the time [ ] Unusual weight gain [ ] Constant or unusual headaches [ ] Feeling dizzy or faint [ ] Changes in eyesight (blurry vision, double vision, or other vision problems) [ ] Changes in mood or behavior (ex: decreased sex drive, irritability, or forgetfulness) [ ] Starting new medications (ex: steroids, other medications that lower immune response) [X ]Patient is not experiencing any of the general symptoms above.   Gastrointestinal  Patient is having less than 1 bowel movements each day.  Is this different from baseline? [ ] Yes [ X]No Are your stools watery or do they have a foul smell? [ ] Yes [X ]No Have you seen blood in your stools? [ ] Yes [ X]No Are your stools dark, tarry, or sticky? [X ]Yes [ ] No Are you having pain or tenderness in your belly? [ ] Yes [ X]No  Skin Does your skin itch? [ ] Yes [ X]No Do you have a rash? [ ] Yes [ X]No Has your skin blistered and/or peeled? [ ] Yes [X ]No Do you have sores in your mouth? [ ] Yes [ X]No  Hepatic Has your urine been dark or tea colored? [ ] Yes [ X]No Have you noticed that your skin or the whites of your eyes are turning yellow? [ ] Yes [X ]No Are you bleeding or bruising more easily than normal? [ ] Yes [X ]No Are you nauseous and/or vomiting? [ ] Yes [ X]No Do you have pain on the right side of your stomach? [ ] Yes [X ]No  Neurologic  Are you having unusual weakness of legs, arms, or face? [X ]Yes [X ]No Are you having numbness or tingling in your hands or feet? [X ]Yes Hand and Feet are "kind of numb"[ ] No  Christine Savage

## 2022-06-29 NOTE — Assessment & Plan Note (Addendum)
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 [ Hx of choroidal melanoma involving her right eye in November 2022].  MRI brain is negative for metastatic disease; HLA-A*02.01- pending.  # Proceed with cycle #1 of  nivolumab at 3 mg/kg and ipilimumab 1 mg/kg. Labs-CBC/chemistries were reviewed with the patient.  Slightly elevated bilirubin-monitor for now.  Again reviewed the potential side effects/mechanism of action of immunotherapy.  Patient instructed to call us if she has any diarrhea or skin rash or any other abnormal side effects.  # Elevated LFTs- sec to Metastatic to melanoma-monitor closely on immunotherapy.  # Bone metastases- consider palliative RT if pain worse; current ly on Tylenol prn. ? Zometa- defer to dr.Rao.   # DISPOSITION:  # treatment today # follow up 1 week- APP; labs- cbc/cmp # follow up with Dr.Rao as planned- Dr.B

## 2022-06-29 NOTE — Progress Notes (Signed)
Bili 1.9 -ok to proceed per MD

## 2022-06-30 ENCOUNTER — Telehealth: Payer: Self-pay | Admitting: *Deleted

## 2022-06-30 NOTE — Telephone Encounter (Signed)
Pt has agreed to a port placement appt with IR on 07/15/22. Arrival time 12:30 pm. NPO 8 hours prior to procedure. Will need a driver. Go to [facility] clinic [facility] for procedure. Answered sevaral other questions that patient had.

## 2022-07-01 LAB — T4: T4, Total: 8.4 ug/dL (ref 4.5–12.0)

## 2022-07-05 ENCOUNTER — Encounter: Payer: Self-pay | Admitting: Oncology

## 2022-07-06 ENCOUNTER — Inpatient Hospital Stay (HOSPITAL_BASED_OUTPATIENT_CLINIC_OR_DEPARTMENT_OTHER): Payer: PPO | Admitting: Medical Oncology

## 2022-07-06 ENCOUNTER — Encounter: Payer: Self-pay | Admitting: Medical Oncology

## 2022-07-06 ENCOUNTER — Inpatient Hospital Stay: Payer: PPO

## 2022-07-06 VITALS — BP 117/60 | HR 69 | Temp 98.9°F | Resp 16 | Wt 174.6 lb

## 2022-07-06 DIAGNOSIS — C6941 Malignant neoplasm of right ciliary body: Secondary | ICD-10-CM

## 2022-07-06 DIAGNOSIS — Z5112 Encounter for antineoplastic immunotherapy: Secondary | ICD-10-CM | POA: Diagnosis not present

## 2022-07-06 DIAGNOSIS — R7989 Other specified abnormal findings of blood chemistry: Secondary | ICD-10-CM | POA: Diagnosis not present

## 2022-07-06 DIAGNOSIS — C787 Secondary malignant neoplasm of liver and intrahepatic bile duct: Secondary | ICD-10-CM | POA: Diagnosis not present

## 2022-07-06 LAB — CMP (CANCER CENTER ONLY)
ALT: 102 U/L — ABNORMAL HIGH (ref 0–44)
AST: 83 U/L — ABNORMAL HIGH (ref 15–41)
Albumin: 3.4 g/dL — ABNORMAL LOW (ref 3.5–5.0)
Alkaline Phosphatase: 140 U/L — ABNORMAL HIGH (ref 38–126)
Anion gap: 4 — ABNORMAL LOW (ref 5–15)
BUN: 9 mg/dL (ref 8–23)
CO2: 29 mmol/L (ref 22–32)
Calcium: 8.5 mg/dL — ABNORMAL LOW (ref 8.9–10.3)
Chloride: 106 mmol/L (ref 98–111)
Creatinine: 0.88 mg/dL (ref 0.44–1.00)
GFR, Estimated: >60 mL/min (ref >60)
Glucose, Bld: 62 mg/dL — ABNORMAL LOW (ref 70–99)
Potassium: 4 mmol/L (ref 3.5–5.1)
Sodium: 139 mmol/L (ref 135–145)
Total Bilirubin: 1.5 mg/dL — ABNORMAL HIGH (ref 0.3–1.2)
Total Protein: 5.6 g/dL — ABNORMAL LOW (ref 6.5–8.1)

## 2022-07-06 LAB — CBC WITH DIFFERENTIAL (CANCER CENTER ONLY)
Abs Immature Granulocytes: 0 10*3/uL (ref 0.00–0.07)
Basophils Absolute: 0.1 10*3/uL (ref 0.0–0.1)
Basophils Relative: 1 %
Eosinophils Absolute: 0.2 10*3/uL (ref 0.0–0.5)
Eosinophils Relative: 4 %
HCT: 44 % (ref 36.0–46.0)
Hemoglobin: 14.5 g/dL (ref 12.0–15.0)
Immature Granulocytes: 0 %
Lymphocytes Relative: 25 %
Lymphs Abs: 1.2 10*3/uL (ref 0.7–4.0)
MCH: 31 pg (ref 26.0–34.0)
MCHC: 33 g/dL (ref 30.0–36.0)
MCV: 94 fL (ref 80.0–100.0)
Monocytes Absolute: 0.6 10*3/uL (ref 0.1–1.0)
Monocytes Relative: 11 %
Neutro Abs: 2.9 10*3/uL (ref 1.7–7.7)
Neutrophils Relative %: 59 %
Platelet Count: 145 10*3/uL — ABNORMAL LOW (ref 150–400)
RBC: 4.68 MIL/uL (ref 3.87–5.11)
RDW: 14.1 % (ref 11.5–15.5)
WBC Count: 4.9 10*3/uL (ref 4.0–10.5)
nRBC: 0 % (ref 0.0–0.2)

## 2022-07-06 NOTE — Addendum Note (Signed)
Addended by: Corene Cornea on: 07/06/2022 10:46 AM   Modules accepted: Orders

## 2022-07-06 NOTE — Progress Notes (Signed)
Pt has not got emla cream and port is set for 7/5, . She is having itching of eye lids, and right eye is sore

## 2022-07-06 NOTE — Progress Notes (Signed)
Christine [facility] CONSULT NOTE  Patient Care Team: Marina Goodell, MD as PCP - General (Family Medicine)  CHIEF COMPLAINTS/PURPOSE OF CONSULTATION:  Melanoma- uveal-   HISTORY OF PRESENTING ILLNESS: Patient ambulating-independently. Alone.    Christine Savage 77 y.o.  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  She states that she has been doing well. She tolerated her first treatment of Nivolumab well. Constipation and mild soreness around original site of occurrence were her only symptoms. She has been using colace with mild improvement. No rash, N/V/D, fevers, abdominal pain.    No nausea no vomiting.  No abdominal pain chest pain shortness of breath or cough.   Review of Systems  Constitutional:  Positive for malaise/fatigue. Negative for chills, diaphoresis, fever and weight loss.  HENT:  Negative for nosebleeds and sore throat.   Eyes:  Negative for double vision.  Respiratory:  Negative for cough, hemoptysis, sputum production, shortness of breath and wheezing.   Cardiovascular:  Negative for chest pain, palpitations, orthopnea and leg swelling.  Gastrointestinal:  Positive for constipation. Negative for abdominal pain, blood in stool, diarrhea, heartburn, melena, nausea and vomiting.  Genitourinary:  Negative for dysuria, frequency and urgency.  Musculoskeletal:  Positive for back pain. Negative for joint pain.  Skin: Negative.  Negative for itching and rash.  Neurological:  Negative for dizziness, tingling, focal weakness, weakness and headaches.  Endo/Heme/Allergies:  Does not bruise/bleed easily.  Psychiatric/Behavioral:  Negative for depression. The patient is not nervous/anxious and does not have insomnia.     MEDICAL HISTORY:  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HCC)    GERD (gastroesophageal reflux disease)    Hyperlipidemia    Hypothyroidism    Insomnia     SURGICAL  HISTORY: Past Surgical History:  Procedure Laterality Date   ABDOMINAL HYSTERECTOMY     CATARACT EXTRACTION W/ INTRAOCULAR LENS  IMPLANT, BILATERAL     COLONOSCOPY WITH PROPOFOL N/A 12/17/2015   Procedure: COLONOSCOPY WITH PROPOFOL;  Surgeon: Midge Minium, MD;  Location[facility];  Service: Endoscopy;  Laterality: N/A;   COLONOSCOPY WITH PROPOFOL N/A 02/23/2021   Procedure: COLONOSCOPY WITH PROPOFOL;  Surgeon: Midge Minium, MD;  Location: [facility] END[facility]COPY;  Service: Endoscopy;  Laterality: N/A;   COLONOSCOPY WITH PROPOFOL N/A 07/20/2021   Procedure: COLONOSCOPY WITH PROPOFOL;  Surgeon: Midge Minium, MD;  Location: [facility] ENDOSCOPY;  Service: Endoscopy;  Laterality: N/A;   ESOPHAGOGASTRODUODENOSCOPY (EGD) WITH PROPOFOL N/A 12/17/2015   Procedure: ESOPHAGOGASTRODUODENOSCOPY (EGD) WITH PROPOFOL;  Surgeon: Midge Minium, MD;  Location: [facility];  Service: Endoscopy;  Laterality: N/A;  Latex sensitivity   EYE SURGERY     NASAL SINUS SURGERY     POLYPECTOMY N/A 12/17/2015   Procedure: POLYPECTOMY;  Surgeon: Midge Minium, MD;  Location: [facility];  Service: Endoscopy;  Laterality: N/A;   ROOT CANAL     TUBAL LIGATION      SOCIAL HISTORY: Social History   Socioeconomic History   Marital status: Married    Spouse name: Not on file   Number of children: Not on file   Years of education: Not on file   Highest education level: Not on file  Occupational History   Not on file  Tobacco Use   Smoking status: Never   Smokeless tobacco: Never  Vaping Use   Vaping Use: Never used  Substance and Sexual Activity   Alcohol use: No  Drug use: No   Sexual activity: Not Currently    Birth control/protection: None  Other Topics Concern   Not on file  Social History Narrative   Not on file   Social Determinants of Health   Financial Resource Strain: Not on file  Food Insecurity: Not on file  Transportation Needs: Not on file  Physical Activity: Not on file  Stress:  Not on file  Social Connections: Not on file  Intimate Partner Violence: Not on file    FAMILY HISTORY: Family History  Problem Relation Age of Onset   Coronary artery disease Mother    Cancer Father    Coronary artery disease Father    Osteoarthritis Brother    Cancer Brother     ALLERGIES:  is allergic to latex and sulfa antibiotics.  MEDICATIONS:  Current Outpatient Medications  Medication Sig Dispense Refill   acetaminophen (TYLENOL) 650 MG CR tablet Take 1,300 mg by mouth.     Calcium Carbonate-Vitamin D 600-400 MG-UNIT tablet Take by mouth.     diphenhydrAMINE (BENADRYL) 25 mg capsule Take by mouth.     levothyroxine (SYNTHROID, LEVOTHROID) 50 MCG tablet TAKE 1 TABLET (50 MCG TOTAL) BY MOUTH ONCE DAILY.     ondansetron (ZOFRAN) 8 MG tablet Take 1 tablet (8 mg total) by mouth every 8 (eight) hours as needed for nausea or vomiting. 30 tablet 1   pantoprazole (PROTONIX) 40 MG tablet Take 40 mg by mouth 2 (two) times daily.     pravastatin (PRAVACHOL) 20 MG tablet Take by mouth.     prochlorperazine (COMPAZINE) 10 MG tablet Take 1 tablet (10 mg total) by mouth every 6 (six) hours as needed for nausea or vomiting. 30 tablet 1   sodium chloride (OCEAN) 0.65 % nasal spray Place into the nose.     Cyanocobalamin 1000 MCG TBCR Take by mouth.     docusate sodium (COLACE) 100 MG capsule Take 100 mg by mouth. (Patient not taking: Reported on 07/06/2022)     ibuprofen (ADVIL) 600 MG tablet Take 600 mg by mouth 3 (three) times daily. (Patient not taking: Reported on 07/06/2022)     lidocaine-prilocaine (EMLA) cream Apply to affected area once (Patient not taking: Reported on 07/06/2022) 30 g 3   Omega-3 Fatty Acids (FISH OIL PO) Take by mouth.     No current facility-administered medications for this visit.    PHYSICAL EXAMINATION:   Vitals:   07/06/22 1010 07/06/22 1022  Pulse:  69  Resp: 16 16  Temp:  98.9 F (37.2 C)   Filed Weights   07/06/22 1010  Weight: 174 lb 9.6 oz  (79.2 kg)    Physical Exam Vitals and nursing note reviewed.  HENT:     Head: Normocephalic and atraumatic.     Mouth/Throat:     Pharynx: Oropharynx is clear.  Eyes:     Extraocular Movements: Extraocular movements intact.     Pupils: Pupils are equal, round, and reactive to light.  Cardiovascular:     Rate and Rhythm: Normal rate and regular rhythm.  Pulmonary:     Comments: Decreased breath sounds bilaterally.  Abdominal:     Palpations: Abdomen is soft.  Musculoskeletal:        General: Normal range of motion.     Cervical back: Normal range of motion.  Skin:    General: Skin is warm.  Neurological:     General: No focal deficit present.     Mental Status: She is alert and oriented  to person, place, and time.  Psychiatric:        Behavior: Behavior normal.        Judgment: Judgment normal.     LABORATORY DATA:  I have reviewed the data as listed Lab Results  Component Value Date   WBC 4.9 07/06/2022   HGB 14.5 07/06/2022   HCT 44.0 07/06/2022   MCV 94.0 07/06/2022   PLT 145 (L) 07/06/2022   Recent Labs    05/05/22 1330 05/27/22 1034 06/08/22 0922 06/29/22 0820 07/06/22 0935  NA  --    < > 141 138 139  K  --    < > 4.9 3.8 4.0  CL  --    < > 108 103 106  CO2  --    < > 26 27 29   GLUCOSE  --    < > 100* 113* 62*  BUN  --    < > 8 11 9   CREATININE  --    < > 0.66 0.86 0.88  CALCIUM  --    < > 8.6* 8.6* 8.5*  GFRNONAA  --    < > >60 >60 >60  PROT 5.4*   < > 6.4* 6.3* 5.6*  ALBUMIN 4.0   < > 3.7 3.7 3.4*  AST 45*   < > 59* 62* 83*  ALT 60*   < > 56* 67* 102*  ALKPHOS 115   < > 99 108 140*  BILITOT 1.1   < > 2.4* 1.9* 1.5*  BILIDIR 0.31  --   --   --   --    < > = values in this interval not displayed.    RADIOGRAPHIC STUDIES: I have personally reviewed the radiological images as listed and agreed with the findings in the report. US BIOPSY (LIVER)  Result Date: 06/08/2022 CLINICAL DATA:  Multiple liver lesions. EXAM: ULTRASOUND-GUIDED CORE LIVER  BIOPSY TECHNIQUE: An ultrasound guided liver biopsy was thoroughly discussed with the patient and questions were answered. The benefits, risks, alternatives, and complications were also discussed. The patient understands and wishes to proceed with the procedure. A verbal as well as written consent was obtained. Survey ultrasound of the liver was performed. The 2 hypermetabolic lesion seen on PET-CT could not be differentiated from innumerable abnormalities throughout both lobes. Therefore, an appropriate skin entry site was determined for approach to representative right-sided lesions. Skin site was marked, prepped with chlorhexidine, and draped in usual sterile fashion, and infiltrated locally with 1% lidocaine. A 17 gauge trocar needle was advanced under ultrasound guidance into the liver. 2 solid-appearing variegated coaxial 18gauge core samples were then obtained through the guide needle. The guide needle was removed. Post procedure scans demonstrate no apparent complication. COMPLICATIONS: COMPLICATIONS None immediate FINDINGS: Multiple poorly defined lesions throughout both hepatic lobes. Representative core biopsy of right lobe lesions obtained. IMPRESSION: Technically successful ultrasound guided core needle biopsy of right hepatic lesions. Electronically Signed   By: Corlis Leak M.D.   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 [ Hx of choroidal melanoma involving her right eye in November 2022].  MRI brain is negative for metastatic disease; HLA-A*02.01- pending.   # Patient tolerated cycle #1 of  nivolumab at 3 mg/kg and ipilimumab 1 mg/kg well with come constipation and elevation of LFTs. Bilirubin actually a bit improved from 1.9 to 1.5. AST up to 83 from 62, ALT up to 102 from 67. We will need to  monitor her labs closely.      # Elevated LFTs- sec to Metastatic to melanoma-monitor closely on immunotherapy. See  above.    # Bone metastases- consider palliative RT if pain worse; Advised to try to hold tylenol if possible given LFT elevation. She states that pain is fairly well controlled.    # DISPOSITION:  RTC next week with APP and labs (CMP) to monitor elevated LFTs on immunotherapy.    Above plan of care was discussed with patient/family in detail.  My contact information was given to the patient/family.     Rushie Chestnut, PA-C 07/06/2022 10:41 AM

## 2022-07-11 ENCOUNTER — Other Ambulatory Visit: Payer: Self-pay

## 2022-07-11 DIAGNOSIS — C787 Secondary malignant neoplasm of liver and intrahepatic bile duct: Secondary | ICD-10-CM

## 2022-07-12 ENCOUNTER — Ambulatory Visit: Payer: PPO

## 2022-07-12 ENCOUNTER — Other Ambulatory Visit: Payer: PPO

## 2022-07-12 ENCOUNTER — Ambulatory Visit: Payer: PPO | Admitting: Internal Medicine

## 2022-07-12 ENCOUNTER — Other Ambulatory Visit: Payer: Self-pay

## 2022-07-13 ENCOUNTER — Inpatient Hospital Stay: Payer: PPO | Attending: Oncology

## 2022-07-13 ENCOUNTER — Other Ambulatory Visit: Payer: Self-pay | Admitting: Student

## 2022-07-13 ENCOUNTER — Inpatient Hospital Stay: Payer: PPO | Admitting: Medical Oncology

## 2022-07-13 DIAGNOSIS — Z79899 Other long term (current) drug therapy: Secondary | ICD-10-CM | POA: Insufficient documentation

## 2022-07-13 DIAGNOSIS — C6931 Malignant neoplasm of right choroid: Secondary | ICD-10-CM | POA: Insufficient documentation

## 2022-07-13 DIAGNOSIS — C7951 Secondary malignant neoplasm of bone: Secondary | ICD-10-CM | POA: Insufficient documentation

## 2022-07-13 DIAGNOSIS — R7989 Other specified abnormal findings of blood chemistry: Secondary | ICD-10-CM | POA: Insufficient documentation

## 2022-07-13 DIAGNOSIS — C787 Secondary malignant neoplasm of liver and intrahepatic bile duct: Secondary | ICD-10-CM | POA: Insufficient documentation

## 2022-07-13 DIAGNOSIS — Z5112 Encounter for antineoplastic immunotherapy: Secondary | ICD-10-CM | POA: Insufficient documentation

## 2022-07-13 DIAGNOSIS — Z01812 Encounter for preprocedural laboratory examination: Secondary | ICD-10-CM

## 2022-07-13 NOTE — Progress Notes (Signed)
Patient for IR Port Insertion on Friday 07/15/2022, I called and spoke with the patient's son, Chanetta Marshall on the phone and gave pre-procedure instructions. Chanetta Marshall was made aware to have the patient here at 12:30p, NPO after MN prior to procedure as well as driver post procedure/recovery/discharge. Chanetta Marshall stated understanding.  Called 07/13/2022

## 2022-07-15 ENCOUNTER — Ambulatory Visit
Admission: RE | Admit: 2022-07-15 | Discharge: 2022-07-15 | Disposition: A | Discharge status: Home / Self Care | Payer: PPO | Source: Ambulatory Visit | Attending: Oncology | Admitting: Oncology

## 2022-07-15 ENCOUNTER — Encounter: Payer: Self-pay | Admitting: Radiology

## 2022-07-15 ENCOUNTER — Other Ambulatory Visit: Payer: Self-pay

## 2022-07-15 DIAGNOSIS — C787 Secondary malignant neoplasm of liver and intrahepatic bile duct: Secondary | ICD-10-CM | POA: Insufficient documentation

## 2022-07-15 DIAGNOSIS — Z01812 Encounter for preprocedural laboratory examination: Secondary | ICD-10-CM

## 2022-07-15 DIAGNOSIS — C439 Malignant melanoma of skin, unspecified: Secondary | ICD-10-CM | POA: Diagnosis not present

## 2022-07-15 HISTORY — PX: IR IMAGING GUIDED PORT INSERTION: IMG5740

## 2022-07-15 MED ORDER — MIDAZOLAM HCL 5 MG/5ML IJ SOLN
INTRAMUSCULAR | Status: AC | PRN
  Administered 2022-07-15: .5 mg via INTRAVENOUS

## 2022-07-15 MED ORDER — HEPARIN SOD (PORK) LOCK FLUSH 100 UNIT/ML IV SOLN
500.0000 [IU] | Freq: Once | INTRAVENOUS | Status: AC
  Administered 2022-07-15: 500 [IU] via INTRAVENOUS

## 2022-07-15 MED ORDER — SODIUM CHLORIDE 0.9 % IV SOLN: INTRAVENOUS | Status: DC

## 2022-07-15 MED ORDER — MIDAZOLAM HCL 2 MG/2ML IJ SOLN
INTRAMUSCULAR | Status: AC
  Filled 2022-07-15: qty 2

## 2022-07-15 MED ORDER — LIDOCAINE-EPINEPHRINE 1 %-1:100000 IJ SOLN
10.0000 mL | Freq: Once | INTRAMUSCULAR | Status: AC
  Administered 2022-07-15: 10 mL via INTRADERMAL

## 2022-07-15 MED ORDER — FENTANYL CITRATE (PF) 100 MCG/2ML IJ SOLN
INTRAMUSCULAR | Status: AC | PRN
  Administered 2022-07-15: 50 ug via INTRAVENOUS
  Administered 2022-07-15 (×2): 25 ug via INTRAVENOUS

## 2022-07-15 MED ORDER — FENTANYL CITRATE (PF) 100 MCG/2ML IJ SOLN
INTRAMUSCULAR | Status: AC
  Filled 2022-07-15: qty 2

## 2022-07-15 MED ORDER — LIDOCAINE-EPINEPHRINE 1 %-1:100000 IJ SOLN
INTRAMUSCULAR | Status: AC
  Filled 2022-07-15: qty 1

## 2022-07-15 MED ORDER — MIDAZOLAM HCL 2 MG/2ML IJ SOLN
INTRAMUSCULAR | Status: AC | PRN
  Administered 2022-07-15: .5 mg via INTRAVENOUS
  Administered 2022-07-15: 1 mg via INTRAVENOUS

## 2022-07-15 MED ORDER — HEPARIN SOD (PORK) LOCK FLUSH 100 UNIT/ML IV SOLN
INTRAVENOUS | Status: AC
  Filled 2022-07-15: qty 5

## 2022-07-15 NOTE — Progress Notes (Signed)
Patient clinically stable post IR RIght Port placement per Dr. Juliette Alcide, tolerated well. Vitals stable pre and post procedure. Received Versed 2 mg along with Fentanyl 100 mcg IV for procedure. Report given to Marni Griffon RN post procedure/specials./9

## 2022-07-15 NOTE — Procedures (Signed)
Interventional Radiology Procedure Note  Date of Procedure: 07/15/2022  Procedure: Port placement   Findings:  1. Port placement right chest    Complications: No immediate complications noted.   Estimated Blood Loss: minimal  Follow-up and Recommendations: 1. Ready for use    Olive Bass, MD  Vascular & Interventional Radiology  07/15/2022 3:05 PM

## 2022-07-15 NOTE — H&P (Signed)
Interventional Radiology - Pre-procedure H&P    Referring Provider: Creig Hines, MD  Planned Procedure: Port placement     History of Present Illness  Christine Savage is a 77 y.o. female with a relevant past medical history of metastatic melanoma seen today for port placement.  Denies history of breast cancer or radiation therapy to the chest.   Reports being in otherwise in usual state of health. ROS negative for recent fever, chest pain, SOB.     Additional Past Medical History 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HCC)    GERD (gastroesophageal reflux disease)    Hyperlipidemia    Hypothyroidism    Insomnia     Surgical History  Past Surgical History:  Procedure Laterality Date   ABDOMINAL HYSTERECTOMY     CATARACT EXTRACTION W/ INTRAOCULAR LENS  IMPLANT, BILATERAL     COLONOSCOPY WITH PROPOFOL N/A 12/17/2015   Procedure: COLONOSCOPY WITH PROPOFOL;  Surgeon: Midge Minium, MD;  Location: [facility];  Service: Endoscopy;  Laterality: N/A;   COLONOSCOPY WITH PROPOFOL N/A 02/23/2021   Procedure: COLONOSCOPY WITH PROPOFOL;  Surgeon: Midge Minium, MD;  Location: [facility];  Service: Endoscopy;  Laterality: N/A;   COLONOSCOPY WITH PROPOFOL N/A 07/20/2021   Procedure: COLONOSCOPY WITH PROPOFOL;  Surgeon: Midge Minium, MD;  Location: [facility] ENDOSCOPY;  Service: Endoscopy;  Laterality: N/A;   ESOPHAGOGASTRODUODENOSCOPY (EGD) WITH PROPOFOL N/A 12/17/2015   Procedure: ESOPHAGOGASTRODUODENOSCOPY (EGD) WITH PROPOFOL;  Surgeon: Midge Minium, MD;  Location: [facility];  Service: Endoscopy;  Laterality: N/A;  Latex sensitivity   EYE SURGERY     NASAL SINUS SURGERY     POLYPECTOMY N/A 12/17/2015   Procedure: POLYPECTOMY;  Surgeon: Midge Minium, MD;  Location: [facility];  Service: Endoscopy;  Laterality: N/A;   ROOT CANAL     TUBAL LIGATION       Medications  I have reviewed the current medication list. Refer to chart for  details. Current Outpatient Medications  Medication Instructions   acetaminophen (TYLENOL) 1,300 mg, Oral   Calcium Carbonate-Vitamin D 600-400 MG-UNIT tablet Oral   Cyanocobalamin 1000 MCG TBCR Oral   diphenhydrAMINE (BENADRYL) 25 mg capsule Oral   docusate sodium (COLACE) 100 mg   ibuprofen (ADVIL) 600 mg, 3 times daily   levothyroxine (SYNTHROID, LEVOTHROID) 50 MCG tablet TAKE 1 TABLET (50 MCG TOTAL) BY MOUTH ONCE DAILY.   lidocaine-prilocaine (EMLA) cream Apply to affected area once   Omega-3 Fatty Acids (FISH OIL PO) Oral   ondansetron (ZOFRAN) 8 mg, Oral, Every 8 hours PRN   pantoprazole (PROTONIX) 40 mg, Oral, 2 times daily   pravastatin (PRAVACHOL) 20 MG tablet Oral   prochlorperazine (COMPAZINE) 10 mg, Oral, Every 6 hours PRN   sodium chloride (OCEAN) 0.65 % nasal spray Nasal     Allergies Allergies  Allergen Reactions   Latex Rash    From Bandaids   Sulfa Antibiotics Nausea Only   Does patient have contrast allergy: No     Physical Exam Current Vitals Temp: 97.8 F (36.6 C) (Temp Source: Oral)  Pulse Rate: 61  Resp: (!) 22  BP: (!) 149/76  SpO2: 97 %  Height: 5\' 5"  (165.1 cm)  Weight: 79.4 kg  Body mass index is 29.12 kg/m.  General: Alert and answers questions appropriately. No apparent distress. Mallampati score: I (soft palate, uvula, fauces, and tonsillar pillars visible) Cardiac: Regular rate. No dependent edema. Pulmonary: Normal work of breathing. On room air. Abdominal:  Soft without distension. Extremities: Normally-formed, well perfused.    Pertinent Lab Results Labs: CBC:    BMP:   Coagulation:    CBC Trends: No results for input(s): "WBC", "HGB", "HCT", "PLT" in the last 72 hours.   Creatinine Trend: No results for input(s): "CREATININE" in the last 72 hours.   Relevant and/or Recent Imaging: PET CT reviewed    Assessment & Plan Christine Savage is a 77 y.o. female with a history of metastatic melanoma seen today for port  placement.  Plan for right chest port placement.   Patient is appropriate candidate for port placement. Risks and benefits discussed, and patient is agreeable to proceed.    Procedure Checklist:  Consent obtained: Risks of the procedure as well as the alternatives and risks of each were explained to the patient and/or caregiver.  Consent for the procedure was obtained and is signed in the bedside chart Consent obtained from: The patient Patient is appropriate candidate for sedation Yes ASA Classification: ASA 3 - Patient with moderate systemic disease with functional limitations NPO status: 0000  Code status:   Code Status: Prior Pre-procedural prep necessary: none      I spent a total of 15 Minutes in face-to-face in clinical consultation, greater than 50% of which was spent on medical decision-making and counseling/coordinating care for port placement.     Olive Bass, MD  Vascular and Interventional Radiology 07/15/2022 1:46 PM

## 2022-07-18 NOTE — Discharge Instructions (Signed)
Implanted [facility] Guide  An implanted port is a type of central line that is placed under the skin. Central lines are used to provide IV access when treatment or nutrition needs to be given through a person's veins. Implanted ports are used for long-term IV access. An implanted port may be placed because: You need IV medicine that would be irritating to the small veins in your hands or arms. You need long-term IV medicines, such as antibiotics. You need IV nutrition for a long period. You need frequent blood draws for lab tests. You need dialysis.   Implanted ports are usually placed in the chest area, but they can also be placed in the upper arm, the abdomen, or the leg. An implanted port has two main parts: Reservoir. The reservoir is round and will appear as a small, raised area under your skin. The reservoir is the part where a needle is inserted to give medicines or draw blood. Catheter. The catheter is a thin, flexible tube that extends from the reservoir. The catheter is placed into a large vein. Medicine that is inserted into the reservoir goes into the catheter and then into the vein.   How will I care for my incision  You may shower tomorrow  How is my port accessed? Special steps must be taken to access the port: Before the port is accessed, a numbing cream can be placed on the skin. This helps numb the skin over the port site. Your health care provider uses a sterile technique to access the port. Your health care provider must put on a mask and sterile gloves. The skin over your port is cleaned carefully with an antiseptic and allowed to dry. The port is gently pinched between sterile gloves, and a needle is inserted into the port. Only "non-coring" port needles should be used to access the port. Once the port is accessed, a blood return should be checked. This helps ensure that the port is in the vein and is not clogged. If your port needs to remain accessed for a constant  infusion, a clear (transparent) bandage will be placed over the needle site. The bandage and needle will need to be changed every week, or as directed by your health care provider.   What is flushing? Flushing helps keep the port from getting clogged. Follow your health care provider's instructions on how and when to flush the port. Ports are usually flushed with saline solution or a medicine called heparin. The need for flushing will depend on how the port is used. If the port is used for intermittent medicines or blood draws, the port will need to be flushed: After medicines have been given. After blood has been drawn. As part of routine maintenance. If a constant infusion is running, the port may not need to be flushed.   How long will my port stay implanted? The port can stay in for as long as your health care provider thinks it is needed. When it is time for the port to come out, surgery will be done to remove it. The procedure is similar to the one performed when the port was put in. When should I seek immediate medical care? When you have an implanted port, you should seek immediate medical care if: You notice a bad smell coming from the incision site. You have swelling, redness, or drainage at the incision site. You have more swelling or pain at the port site or the surrounding area. You have a fever that  is not controlled with medicine.   This information is not intended to replace advice given to you by your health care provider. Make sure you discuss any questions you have with your health care provider. Document Released: 12/27/2004 Document Revised: 06/04/2015 Document Reviewed: 09/03/2012 Elsevier Interactive Patient Education  2017 [employer]  Implanted [facility] Guide  An implanted port is a type of central line that is placed under the skin. Central lines are used to provide IV access when treatment or nutrition needs to be given through a person's veins. Implanted ports  are used for long-term IV access. An implanted port may be placed because: You need IV medicine that would be irritating to the small veins in your hands or arms. You need long-term IV medicines, such as antibiotics. You need IV nutrition for a long period. You need frequent blood draws for lab tests. You need dialysis.   Implanted ports are usually placed in the chest area, but they can also be placed in the upper arm, the abdomen, or the leg. An implanted port has two main parts: Reservoir. The reservoir is round and will appear as a small, raised area under your skin. The reservoir is the part where a needle is inserted to give medicines or draw blood. Catheter. The catheter is a thin, flexible tube that extends from the reservoir. The catheter is placed into a large vein. Medicine that is inserted into the reservoir goes into the catheter and then into the vein.   How will I care for my incision   Please remove dressing in 24hrs You may shower tomorrow No other skin care is needed   How is my port accessed? Special steps must be taken to access the port: Before the port is accessed, a numbing cream can be placed on the skin. This helps numb the skin over the port site. Your health care provider uses a sterile technique to access the port. Your health care provider must put on a mask and sterile gloves. The skin over your port is cleaned carefully with an antiseptic and allowed to dry. The port is gently pinched between sterile gloves, and a needle is inserted into the port. Only "non-coring" port needles should be used to access the port. Once the port is accessed, a blood return should be checked. This helps ensure that the port is in the vein and is not clogged. If your port needs to remain accessed for a constant infusion, a clear (transparent) bandage will be placed over the needle site. The bandage and needle will need to be changed every week, or as directed by your health care  provider.   What is flushing? Flushing helps keep the port from getting clogged. Follow your health care provider's instructions on how and when to flush the port. Ports are usually flushed with saline solution or a medicine called heparin. The need for flushing will depend on how the port is used. If the port is used for intermittent medicines or blood draws, the port will need to be flushed: After medicines have been given. After blood has been drawn. As part of routine maintenance. If a constant infusion is running, the port may not need to be flushed.   How long will my port stay implanted? The port can stay in for as long as your health care provider thinks it is needed. When it is time for the port to come out, surgery will be done to remove it. The procedure is similar to  the one performed when the port was put in. When should I seek immediate medical care? When you have an implanted port, you should seek immediate medical care if: You notice a bad smell coming from the incision site. You have swelling, redness, or drainage at the incision site. You have more swelling or pain at the port site or the surrounding area. You have a fever that is not controlled with medicine.   This information is not intended to replace advice given to you by your health care provider. Make sure you discuss any questions you have with your health care provider. Document Released: 12/27/2004 Document Revised: 06/04/2015 Document Reviewed: 09/03/2012 Elsevier Interactive Patient Education  2017 [employer].

## 2022-07-20 ENCOUNTER — Inpatient Hospital Stay: Payer: PPO

## 2022-07-20 ENCOUNTER — Encounter: Payer: Self-pay | Admitting: Oncology

## 2022-07-20 ENCOUNTER — Inpatient Hospital Stay (HOSPITAL_BASED_OUTPATIENT_CLINIC_OR_DEPARTMENT_OTHER): Payer: PPO | Admitting: Oncology

## 2022-07-20 VITALS — BP 125/74 | HR 77 | Temp 96.6°F | Ht 65.0 in | Wt 173.0 lb

## 2022-07-20 DIAGNOSIS — Z5112 Encounter for antineoplastic immunotherapy: Secondary | ICD-10-CM

## 2022-07-20 DIAGNOSIS — R7989 Other specified abnormal findings of blood chemistry: Secondary | ICD-10-CM | POA: Diagnosis not present

## 2022-07-20 DIAGNOSIS — C6941 Malignant neoplasm of right ciliary body: Secondary | ICD-10-CM

## 2022-07-20 DIAGNOSIS — C787 Secondary malignant neoplasm of liver and intrahepatic bile duct: Secondary | ICD-10-CM

## 2022-07-20 DIAGNOSIS — Z79899 Other long term (current) drug therapy: Secondary | ICD-10-CM | POA: Diagnosis not present

## 2022-07-20 DIAGNOSIS — C6931 Malignant neoplasm of right choroid: Secondary | ICD-10-CM | POA: Diagnosis not present

## 2022-07-20 DIAGNOSIS — C7951 Secondary malignant neoplasm of bone: Secondary | ICD-10-CM | POA: Diagnosis not present

## 2022-07-20 LAB — CMP (CANCER CENTER ONLY)
ALT: 53 U/L — ABNORMAL HIGH (ref 0–44)
AST: 59 U/L — ABNORMAL HIGH (ref 15–41)
Albumin: 3.2 g/dL — ABNORMAL LOW (ref 3.5–5.0)
Alkaline Phosphatase: 182 U/L — ABNORMAL HIGH (ref 38–126)
Anion gap: 8 (ref 5–15)
BUN: 9 mg/dL (ref 8–23)
CO2: 26 mmol/L (ref 22–32)
Calcium: 8.5 mg/dL — ABNORMAL LOW (ref 8.9–10.3)
Chloride: 103 mmol/L (ref 98–111)
Creatinine: 0.89 mg/dL (ref 0.44–1.00)
GFR, Estimated: >60 mL/min (ref >60)
Glucose, Bld: 125 mg/dL — ABNORMAL HIGH (ref 70–99)
Potassium: 3.5 mmol/L (ref 3.5–5.1)
Sodium: 137 mmol/L (ref 135–145)
Total Bilirubin: 1.8 mg/dL — ABNORMAL HIGH (ref 0.3–1.2)
Total Protein: 5.8 g/dL — ABNORMAL LOW (ref 6.5–8.1)

## 2022-07-20 LAB — CBC WITH DIFFERENTIAL (CANCER CENTER ONLY)
Abs Immature Granulocytes: 0.02 10*3/uL (ref 0.00–0.07)
Basophils Absolute: 0.1 10*3/uL (ref 0.0–0.1)
Basophils Relative: 2 %
Eosinophils Absolute: 0.3 10*3/uL (ref 0.0–0.5)
Eosinophils Relative: 5 %
HCT: 43.4 % (ref 36.0–46.0)
Hemoglobin: 14.5 g/dL (ref 12.0–15.0)
Immature Granulocytes: 0 %
Lymphocytes Relative: 20 %
Lymphs Abs: 1.1 10*3/uL (ref 0.7–4.0)
MCH: 30.7 pg (ref 26.0–34.0)
MCHC: 33.4 g/dL (ref 30.0–36.0)
MCV: 91.8 fL (ref 80.0–100.0)
Monocytes Absolute: 0.8 10*3/uL (ref 0.1–1.0)
Monocytes Relative: 15 %
Neutro Abs: 3.2 10*3/uL (ref 1.7–7.7)
Neutrophils Relative %: 58 %
Platelet Count: 186 10*3/uL (ref 150–400)
RBC: 4.73 MIL/uL (ref 3.87–5.11)
RDW: 14.1 % (ref 11.5–15.5)
WBC Count: 5.5 10*3/uL (ref 4.0–10.5)
nRBC: 0 % (ref 0.0–0.2)

## 2022-07-20 MED ORDER — FAMOTIDINE IN NACL 20-0.9 MG/50ML-% IV SOLN
20.0000 mg | Freq: Once | INTRAVENOUS | Status: AC
  Administered 2022-07-20: 20 mg via INTRAVENOUS
  Filled 2022-07-20: qty 50

## 2022-07-20 MED ORDER — SODIUM CHLORIDE 0.9 % IV SOLN
1.0000 mg/kg | Freq: Once | INTRAVENOUS | Status: AC
  Administered 2022-07-20: 80 mg via INTRAVENOUS
  Filled 2022-07-20: qty 16

## 2022-07-20 MED ORDER — HEPARIN SOD (PORK) LOCK FLUSH 100 UNIT/ML IV SOLN
500.0000 [IU] | Freq: Once | INTRAVENOUS | Status: AC | PRN
  Administered 2022-07-20: 500 [IU]
  Filled 2022-07-20: qty 5

## 2022-07-20 MED ORDER — DIPHENHYDRAMINE HCL 50 MG/ML IJ SOLN
25.0000 mg | Freq: Once | INTRAMUSCULAR | Status: AC
  Administered 2022-07-20: 25 mg via INTRAVENOUS
  Filled 2022-07-20: qty 1

## 2022-07-20 MED ORDER — SODIUM CHLORIDE 0.9 % IV SOLN
Freq: Once | INTRAVENOUS | Status: AC
  Filled 2022-07-20: qty 250

## 2022-07-20 MED ORDER — SODIUM CHLORIDE 0.9% FLUSH
10.0000 mL | INTRAVENOUS | Status: DC | PRN
  Administered 2022-07-20: 10 mL
  Filled 2022-07-20: qty 10

## 2022-07-20 MED ORDER — SODIUM CHLORIDE 0.9 % IV SOLN
240.0000 mg | Freq: Once | INTRAVENOUS | Status: AC
  Administered 2022-07-20: 240 mg via INTRAVENOUS
  Filled 2022-07-20: qty 24

## 2022-07-20 NOTE — Progress Notes (Signed)
C/o vision, eyes feel sore, rt eye blurry/sore.

## 2022-07-20 NOTE — Progress Notes (Signed)
Ipilimumab (YERVOY) Patient Monitoring Assessment   Is the patient experiencing any of the following general symptoms?:  [ ] Difficulty performing normal activities [ ] Feeling sluggish or cold all the time [ ] Unusual weight gain [ ] Constant or unusual headaches [ ] Feeling dizzy or faint [ X]Changes in eyesight (blurry vision, double vision, or other vision problems) [ ] Changes in mood or behavior (ex: decreased sex drive, irritability, or forgetfulness) [ ] Starting new medications (ex: steroids, other medications that lower immune response) [ ] Patient is not experiencing any of the general symptoms above.   Gastrointestinal  Patient is having 1 bowel movements each day.  Is this different from baseline? [ ] Yes [X ]No Are your stools watery or do they have a foul smell? [ ] Yes [X ]No Have you seen blood in your stools? [ ] Yes [X ]No Are your stools dark, tarry, or sticky? [ ] Yes [X ]No Are you having pain or tenderness in your belly? [ ] Yes [X ]No  Skin Does your skin itch? [ ] Yes [ X]No Do you have a rash? [ ] Yes [X ]No Has your skin blistered and/or peeled? [ ] Yes [X ]No Do you have sores in your mouth? [ ] Yes [ X]No  Hepatic Has your urine been dark or tea colored? [ ] Yes [X ]No Have you noticed that your skin or the whites of your eyes are turning yellow? [ ] Yes [ X]No Are you bleeding or bruising more easily than normal? [ ] Yes [ X]No Are you nauseous and/or vomiting? [ ] Yes [X ]No Do you have pain on the right side of your stomach? [ ] Yes [ X]No  Neurologic  Are you having unusual weakness of legs, arms, or face? [ ] Yes [X ]No Are you having numbness or tingling in your hands or feet? [ ] Yes [ X]No  Christine Savage  Per Dr. Smith Robert, OK to proceed.

## 2022-07-20 NOTE — Patient Instructions (Signed)
[place] [facility] [place] [facility]  Discharge Instructions: Thank you for choosing [facility] to provide your oncology and hematology care.  If you have a lab appointment with the Cancer Center, please go directly to the Cancer Center and check in at the registration area.  Wear comfortable clothing and clothing appropriate for easy access to any Portacath or PICC line.   We strive to give you quality time with your provider. You may need to reschedule your appointment if you arrive late (15 or more minutes).  Arriving late affects you and other patients whose appointments are after yours.  Also, if you miss three or more appointments without notifying the office, you may be dismissed from the clinic at the provider's discretion.      For prescription refill requests, have your pharmacy contact our office and allow 72 hours for refills to be completed.    Today you received the following chemotherapy and/or immunotherapy agents: Opdivo, Yervoy.   To help prevent nausea and vomiting after your treatment, we encourage you to take your nausea medication as directed.  BELOW ARE SYMPTOMS THAT SHOULD BE REPORTED IMMEDIATELY: *FEVER GREATER THAN 100.4 F (38 C) OR HIGHER *CHILLS OR SWEATING *NAUSEA AND VOMITING THAT IS NOT CONTROLLED WITH YOUR NAUSEA MEDICATION *UNUSUAL SHORTNESS OF BREATH *UNUSUAL BRUISING OR BLEEDING *URINARY PROBLEMS (pain or burning when urinating, or frequent urination) *BOWEL PROBLEMS (unusual diarrhea, constipation, pain near the anus) TENDERNESS IN MOUTH AND THROAT WITH OR WITHOUT PRESENCE OF ULCERS (sore throat, sores in mouth, or a toothache) UNUSUAL RASH, SWELLING OR PAIN  UNUSUAL VAGINAL DISCHARGE OR ITCHING   Items with * indicate a potential emergency and should be followed up as soon as possible or go to the Emergency Department if any problems should occur.  Please show the CHEMOTHERAPY ALERT CARD or IMMUNOTHERAPY ALERT CARD at check-in  to the Emergency Department and triage nurse.  Should you have questions after your visit or need to cancel or reschedule your appointment, please contact [place] [facility] [place] [facility]  [phone] and follow the prompts.  Office hours are 8:00 a.m. to 4:30 p.m. Monday - Friday. Please note that voicemails left after 4:00 p.m. may not be returned until the following business day.  We are closed weekends and major holidays. You have access to a nurse at all times for urgent questions. Please call the main number to the clinic [phone] and follow the prompts.  For any non-urgent questions, you may also contact your provider using MyChart. We now offer e-Visits for anyone 18 and older to request care online for non-urgent symptoms. For details visit [email or web address].   Also download the MyChart app! Go to the app store, search "MyChart", open the app, select [facility], and log in with your MyChart username and password.    

## 2022-07-20 NOTE — Progress Notes (Signed)
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  She is s/p I 125 plaque radiotherapy placed on 11/16/20 and removed 11/20/20 she sees Dr. Gwendalyn Ege from [facility] for the same.  Recently patient has been onIntraocular a Avastin injections as well.  Patient was seen by Dr. Servando Snare from GI for abnormal LFTs.As a part of his workup he had ordered an ultrasound abdomen which showed indeterminate hypoechoic lesions in the liver and MRI was recommended.  MRI liver with and without contrast showed diffuse hepatic replacement with heterogeneous T2 signal and enhancement favoring metastatic disease.  Given the clinical history some of these areas favor melanoma metastases.  Heterogenous marrow signal including a 1.4 cm L5 vertebral body lesion concerning for bone metastases.  Patient has been referred for further management   Family history significant for breast cancer in her paternal grandmother.  Father died of brain tumor.  Patient does not remember having melanoma involving her skin.  She states that about 35 years ago there was a lesion removed from the tip of her nose which was possibly cancer.  She lives with her husband and is independent of her ADLs and IADLs.  She reports mild low back pain but denies other complaints at this time   PET scan showed 2 discrete hypermetabolic liver masses with a background of diffusely heterogeneous liver parenchymal density and nodular contour which may represent pseudocirrhotic  appearance related to diffuse metastatic disease.  Faintly sclerotic L5 vertebral and proximal left humeral osseous metastases   Ultrasound-guided liver biopsy to target the hypermetabolic liver masses was attempted but since those masses were not clearly visible on the ultrasound random right-sided liver biopsies were taken and was consistent with metastatic malignant melanoma.  NGS testing showed no actionable mutations and BRAF testing negative.  PD-L1 less than 1%.  Tumor mutational burden not high and MSI stablePatient was also seen in [facility] for second opinion combination ipilimumab and nivolumab was recommended  Interval history-she tolerated cycle 1 of treatment well without any significant nausea vomiting or skin rash.  She does reports pain and blurry vision in her right eye some of which is chronic and has been following up with ophthalmology Dr. Vanetta Shawl from [facility].  ECOG PS- 1 Pain scale- 0   Review of systems- Review of Systems  Constitutional:  Positive for malaise/fatigue. Negative for chills, fever and weight loss.  HENT:  Negative for congestion, ear discharge and nosebleeds.   Eyes:  Positive for blurred vision.  Respiratory:  Negative for cough, hemoptysis, sputum production, shortness of breath and wheezing.   Cardiovascular:  Negative for chest pain, palpitations, orthopnea and claudication.  Gastrointestinal:  Negative for abdominal pain, blood in stool, constipation, diarrhea, heartburn, melena, nausea and vomiting.  Genitourinary:  Negative for dysuria, flank pain, frequency, hematuria and urgency.  Musculoskeletal:  Negative for back pain, joint pain and myalgias.  Skin:  Negative for rash.  Neurological:  Negative for dizziness, tingling, focal weakness, seizures, weakness and headaches.  Endo/Heme/Allergies:  Does not bruise/bleed easily.  Psychiatric/Behavioral:  Negative for  depression and suicidal ideas. The patient does not have insomnia.        Allergies  Allergen Reactions   Latex Rash    From Bandaids   Sulfa Antibiotics Nausea Only     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HCC)    GERD (gastroesophageal reflux disease)    Hyperlipidemia    Hypothyroidism    Insomnia      Past Surgical History:  Procedure Laterality Date   ABDOMINAL HYSTERECTOMY     CATARACT EXTRACTION W/ INTRAOCULAR LENS  IMPLANT, BILATERAL     COLONOSCOPY WITH PROPOFOL N/A 12/17/2015   Procedure: COLONOSCOPY WITH PROPOFOL;  Surgeon: Midge Minium, MD;  Location: [facility];  Service: Endoscopy;  Laterality: N/A;   COLONOSCOPY WITH PROPOFOL N/A 02/23/2021   Procedure: COLONOSCOPY WITH PROPOFOL;  Surgeon: Midge Minium, MD;  Location: [facility] END[facility]COPY;  Service: Endoscopy;  Laterality: N/A;   COLONOSCOPY WITH PROPOFOL N/A 07/20/2021   Procedure: COLONOSCOPY WITH PROPOFOL;  Surgeon: Midge Minium, MD;  Location: [facility] ENDOSCOPY;  Service: Endoscopy;  Laterality: N/A;   ESOPHAGOGASTRODUODENOSCOPY (EGD) WITH PROPOFOL N/A 12/17/2015   Procedure: ESOPHAGOGASTRODUODENOSCOPY (EGD) WITH PROPOFOL;  Surgeon: Midge Minium, MD;  Location: [facility];  Service: Endoscopy;  Laterality: N/A;  Latex sensitivity   EYE SURGERY     IR IMAGING GUIDED PORT INSERTION  07/15/2022   NASAL SINUS SURGERY     POLYPECTOMY N/A 12/17/2015   Procedure: POLYPECTOMY;  Surgeon: Midge Minium, MD;  Location: [facility];  Service: Endoscopy;  Laterality: N/A;   ROOT CANAL     TUBAL LIGATION      Social History   Socioeconomic History   Marital status: Married    Spouse name: Not on file   Number of children: Not on file   Years of education: Not on file   Highest education level: Not on file  Occupational History   Not on file  Tobacco Use   Smoking status: Never   Smokeless tobacco: Never  Vaping Use   Vaping Use: Never used  Substance and Sexual Activity   Alcohol use: No   Drug use: No   Sexual activity: Not Currently     Birth control/protection: None  Other Topics Concern   Not on file  Social History Narrative   Not on file   Social Determinants of Health   Financial Resource Strain: Not on file  Food Insecurity: Not on file  Transportation Needs: Not on file  Physical Activity: Not on file  Stress: Not on file  Social Connections: Not on file  Intimate Partner Violence: Not on file    Family History  Problem Relation Age of Onset   Coronary artery disease Mother    Cancer Father    Coronary artery disease Father    Osteoarthritis Brother    Cancer Brother      Current Outpatient Medications:    acetaminophen (TYLENOL) 650 MG CR tablet, Take 1,300 mg by mouth., Disp: , Rfl:    Calcium Carbonate-Vitamin D 600-400 MG-UNIT tablet, Take by mouth., Disp: , Rfl:    Cyanocobalamin 1000 MCG TBCR, Take by mouth., Disp: , Rfl:    diphenhydrAMINE (BENADRYL) 25 mg capsule, Take by mouth., Disp: , Rfl:    docusate sodium (COLACE) 100 MG capsule, Take 100 mg by mouth., Disp: , Rfl:    ibuprofen (ADVIL) 600 MG tablet, Take 600 mg by mouth 3 (three) times daily., Disp: , Rfl:  levothyroxine (SYNTHROID, LEVOTHROID) 50 MCG tablet, TAKE 1 TABLET (50 MCG TOTAL) BY MOUTH ONCE DAILY., Disp: , Rfl:    Omega-3 Fatty Acids (FISH OIL PO), Take by mouth., Disp: , Rfl:    ondansetron (ZOFRAN) 8 MG tablet, Take 1 tablet (8 mg total) by mouth every 8 (eight) hours as needed for nausea or vomiting., Disp: 30 tablet, Rfl: 1   pantoprazole (PROTONIX) 40 MG tablet, Take 40 mg by mouth 2 (two) times daily., Disp: , Rfl:    prochlorperazine (COMPAZINE) 10 MG tablet, Take 1 tablet (10 mg total) by mouth every 6 (six) hours as needed for nausea or vomiting., Disp: 30 tablet, Rfl: 1   sodium chloride (OCEAN) 0.65 % nasal spray, Place into the nose., Disp: , Rfl:    lidocaine-prilocaine (EMLA) cream, Apply to affected area once (Patient not taking: Reported on 07/06/2022), Disp: 30 g, Rfl: 3   pravastatin (PRAVACHOL) 20 MG  tablet, Take by mouth., Disp: , Rfl:  No current facility-administered medications for this visit.  Facility-Administered Medications Ordered in Other Visits:    sodium chloride flush (NS) 0.9 % injection 10 mL, 10 mL, Intracatheter, PRN, Creig Hines, MD, 10 mL at 07/20/22 1331  Physical exam:  Vitals:   07/20/22 0925  BP: 125/74  Pulse: 77  Temp: (!) 96.6 F (35.9 C)  TempSrc: Tympanic  SpO2: 94%  Weight: 173 lb (78.5 kg)  Height: 5\' 5"  (1.651 m)   Physical Exam Eyes:     Comments: Diffuse erythema involving the right eye  Cardiovascular:     Rate and Rhythm: Normal rate and regular rhythm.     Heart sounds: Normal heart sounds.  Pulmonary:     Effort: Pulmonary effort is normal.     Breath sounds: Normal breath sounds.  Abdominal:     General: Bowel sounds are normal.     Palpations: Abdomen is soft.  Skin:    General: Skin is warm and dry.  Neurological:     Mental Status: She is alert and oriented to person, place, and time.         Latest Ref Rng & Units 07/20/2022    9:29 AM  CMP  Glucose 70 - 99 mg/dL 161   BUN 8 - 23 mg/dL 9   Creatinine 0.96 - 0.45 mg/dL 4.09   Sodium 811 - 914 mmol/L 137   Potassium 3.5 - 5.1 mmol/L 3.5   Chloride 98 - 111 mmol/L 103   CO2 22 - 32 mmol/L 26   Calcium 8.9 - 10.3 mg/dL 8.5   Total Protein 6.5 - 8.1 g/dL 5.8   Total Bilirubin 0.3 - 1.2 mg/dL 1.8   Alkaline Phos 38 - 126 U/L 182   AST 15 - 41 U/L 59   ALT 0 - 44 U/L 53       Latest Ref Rng & Units 07/20/2022    9:29 AM  CBC  WBC 4.0 - 10.5 K/uL 5.5   Hemoglobin 12.0 - 15.0 g/dL 78.2   Hematocrit 95.6 - 46.0 % 43.4   Platelets 150 - 400 K/uL 186     No images are attached to the encounter.  IR IMAGING GUIDED PORT INSERTION  Result Date: 07/15/2022 INDICATION: port insertion for melanoma EXAM: Chest port placement using ultrasound and fluoroscopic guidance MEDICATIONS: Documented in the EMR ANESTHESIA/SEDATION: Moderate (conscious) sedation was employed during  this procedure. A total of Versed 2 mg and Fentanyl 100 mcg was administered intravenously. Moderate Sedation Time: 20 minutes. The  patient's level of consciousness and vital signs were monitored continuously by radiology nursing throughout the procedure under my direct supervision. FLUOROSCOPY TIME:  Fluoroscopy Time: 0.5 minutes (1 mGy) COMPLICATIONS: None immediate. PROCEDURE: Informed written consent was obtained from the patient after a thorough discussion of the procedural risks, benefits and alternatives. All questions were addressed. Maximal Sterile Barrier Technique was utilized including caps, mask, sterile gowns, sterile gloves, sterile drape, hand hygiene and skin antiseptic. A timeout was performed prior to the initiation of the procedure. The patient was placed supine on the exam table. The right neck and chest was prepped and draped in the standard sterile fashion. A preliminary ultrasound of the right neck was performed and demonstrates a patent right internal jugular vein. A permanent ultrasound image was stored in the electronic medical record. The overlying skin was anesthetized with 1% Lidocaine. Using ultrasound guidance, access was obtained into the right internal jugular vein using a 21 gauge micropuncture set. A wire was advanced into the SVC, a short incision was made at the puncture site, and serial dilatation performed. Next, in an ipsilateral infraclavicular location, an incision was made at the site of the subcutaneous reservoir. Blunt dissection was used to open a pocket to contain the reservoir. A subcutaneous tunnel was then created from the port site to the puncture site. A(n) 8 Fr single lumen catheter was advanced through the tunnel. The catheter was attached to the port and this was placed in the subcutaneous pocket. Under fluoroscopic guidance, a peel away sheath was placed, and the catheter was trimmed to the appropriate length and was advanced into the central veins. The  catheter length is 22 cm. The tip of the catheter lies near the superior cavoatrial junction. The port flushes and aspirates appropriately. The port was flushed and locked with heparinized saline. The port pocket was closed in 2 layers using 3-0 and 4-0 Vicryl/absorbable suture. Dermabond was also applied to both incisions. The patient tolerated the procedure well and was transferred to recovery in stable condition. IMPRESSION: Successful placement of a right-sided chest port via the right internal jugular vein. The port is ready for immediate use. Electronically Signed   By: Olive Bass M.D.   On: 07/15/2022 16:20     Assessment and plan- Patient is a 77 y.o. female with metastatic uveal melanoma with liver metastases here for on treatment assessment prior to cycle 2 of ipilimumab and nivolumab  Counts okay to proceed with cycle 2 of ipilimumab and nivolumab today.  I will see her back in 3 weeks for cycle 3.  She has baseline abnormal LFTs and mildly elevated bilirubin prior to starting treatment and overall her LFTs have remained stable.  This is likely secondary to her disease burden in the liver and not a side effect of immunotherapy by itself.  I will plan to get a PET CT scan in the next 2 to 3 weeks time to assess response to treatment.  If she responds well plan is to do 4 cycles of ipilimumab and nivolumab followed by single agent nivolumab until progression or toxicity.  Blurry vision in her right eye: I have asked her to get in touch with Dr. Gwendalyn Ege who is her ophthalmologist.   Visit Diagnosis 1. 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 Encounter for antineoplastic immunotherapy   4. Abnormal LFTs      Dr. Owens Shark, MD, MPH [facility] [phone] 07/20/2022  4:01 PM

## 2022-07-21 ENCOUNTER — Other Ambulatory Visit: Payer: Self-pay

## 2022-08-02 ENCOUNTER — Ambulatory Visit: Payer: PPO | Admitting: Oncology

## 2022-08-02 ENCOUNTER — Other Ambulatory Visit: Payer: PPO

## 2022-08-02 ENCOUNTER — Ambulatory Visit: Payer: PPO

## 2022-08-08 ENCOUNTER — Encounter
Admission: RE | Admit: 2022-08-08 | Discharge: 2022-08-08 | Disposition: A | Discharge status: Home / Self Care | Payer: PPO | Source: Ambulatory Visit | Attending: Oncology | Admitting: Oncology

## 2022-08-08 DIAGNOSIS — C787 Secondary malignant neoplasm of liver and intrahepatic bile duct: Secondary | ICD-10-CM | POA: Insufficient documentation

## 2022-08-08 DIAGNOSIS — C439 Malignant melanoma of skin, unspecified: Secondary | ICD-10-CM | POA: Diagnosis not present

## 2022-08-08 DIAGNOSIS — C6941 Malignant neoplasm of right ciliary body: Secondary | ICD-10-CM | POA: Diagnosis not present

## 2022-08-08 LAB — GLUCOSE, CAPILLARY: Glucose-Capillary: 103 mg/dL — ABNORMAL HIGH (ref 70–99)

## 2022-08-08 MED ORDER — FLUDEOXYGLUCOSE F - 18 (FDG) INJECTION
9.0000 | Freq: Once | INTRAVENOUS | Status: AC | PRN
  Administered 2022-08-08: 9.04 via INTRAVENOUS

## 2022-08-10 ENCOUNTER — Inpatient Hospital Stay (HOSPITAL_BASED_OUTPATIENT_CLINIC_OR_DEPARTMENT_OTHER): Payer: PPO | Admitting: Oncology

## 2022-08-10 ENCOUNTER — Inpatient Hospital Stay: Payer: PPO

## 2022-08-10 ENCOUNTER — Encounter: Payer: Self-pay | Admitting: Oncology

## 2022-08-10 VITALS — BP 114/72 | HR 95 | Temp 98.4°F | Resp 18 | Ht 65.0 in | Wt 169.7 lb

## 2022-08-10 DIAGNOSIS — C6941 Malignant neoplasm of right ciliary body: Secondary | ICD-10-CM

## 2022-08-10 DIAGNOSIS — C787 Secondary malignant neoplasm of liver and intrahepatic bile duct: Secondary | ICD-10-CM

## 2022-08-10 DIAGNOSIS — Z5112 Encounter for antineoplastic immunotherapy: Secondary | ICD-10-CM | POA: Diagnosis not present

## 2022-08-10 LAB — CMP (CANCER CENTER ONLY)
ALT: 67 U/L — ABNORMAL HIGH (ref 0–44)
AST: 102 U/L — ABNORMAL HIGH (ref 15–41)
Albumin: 2.8 g/dL — ABNORMAL LOW (ref 3.5–5.0)
Alkaline Phosphatase: 186 U/L — ABNORMAL HIGH (ref 38–126)
Anion gap: 4 — ABNORMAL LOW (ref 5–15)
BUN: 13 mg/dL (ref 8–23)
CO2: 27 mmol/L (ref 22–32)
Calcium: 8.2 mg/dL — ABNORMAL LOW (ref 8.9–10.3)
Chloride: 102 mmol/L (ref 98–111)
Creatinine: 0.85 mg/dL (ref 0.44–1.00)
GFR, Estimated: >60 mL/min (ref >60)
Glucose, Bld: 147 mg/dL — ABNORMAL HIGH (ref 70–99)
Potassium: 3.8 mmol/L (ref 3.5–5.1)
Sodium: 133 mmol/L — ABNORMAL LOW (ref 135–145)
Total Bilirubin: 1.6 mg/dL — ABNORMAL HIGH (ref 0.3–1.2)
Total Protein: 5.3 g/dL — ABNORMAL LOW (ref 6.5–8.1)

## 2022-08-10 LAB — CBC WITH DIFFERENTIAL (CANCER CENTER ONLY)
Abs Immature Granulocytes: 0.02 10*3/uL (ref 0.00–0.07)
Basophils Absolute: 0.1 10*3/uL (ref 0.0–0.1)
Basophils Relative: 1 %
Eosinophils Absolute: 0.3 10*3/uL (ref 0.0–0.5)
Eosinophils Relative: 3 %
HCT: 42.1 % (ref 36.0–46.0)
Hemoglobin: 14.3 g/dL (ref 12.0–15.0)
Immature Granulocytes: 0 %
Lymphocytes Relative: 16 %
Lymphs Abs: 1.2 10*3/uL (ref 0.7–4.0)
MCH: 30.8 pg (ref 26.0–34.0)
MCHC: 34 g/dL (ref 30.0–36.0)
MCV: 90.7 fL (ref 80.0–100.0)
Monocytes Absolute: 1 10*3/uL (ref 0.1–1.0)
Monocytes Relative: 14 %
Neutro Abs: 5 10*3/uL (ref 1.7–7.7)
Neutrophils Relative %: 66 %
Platelet Count: 240 10*3/uL (ref 150–400)
RBC: 4.64 MIL/uL (ref 3.87–5.11)
RDW: 14.5 % (ref 11.5–15.5)
WBC Count: 7.6 10*3/uL (ref 4.0–10.5)
nRBC: 0 % (ref 0.0–0.2)

## 2022-08-10 LAB — TSH: TSH: 4.204 u[IU]/mL (ref 0.350–4.500)

## 2022-08-10 MED ORDER — DIPHENHYDRAMINE HCL 50 MG/ML IJ SOLN
25.0000 mg | Freq: Once | INTRAMUSCULAR | Status: AC
  Administered 2022-08-10: 25 mg via INTRAVENOUS
  Filled 2022-08-10: qty 1

## 2022-08-10 MED ORDER — SODIUM CHLORIDE 0.9 % IV SOLN
Freq: Once | INTRAVENOUS | Status: AC
  Filled 2022-08-10: qty 250

## 2022-08-10 MED ORDER — SODIUM CHLORIDE 0.9 % IV SOLN
1.0000 mg/kg | Freq: Once | INTRAVENOUS | Status: AC
  Administered 2022-08-10: 80 mg via INTRAVENOUS
  Filled 2022-08-10: qty 16

## 2022-08-10 MED ORDER — FAMOTIDINE IN NACL 20-0.9 MG/50ML-% IV SOLN
20.0000 mg | Freq: Once | INTRAVENOUS | Status: AC
  Administered 2022-08-10: 20 mg via INTRAVENOUS
  Filled 2022-08-10: qty 50

## 2022-08-10 MED ORDER — HEPARIN SOD (PORK) LOCK FLUSH 100 UNIT/ML IV SOLN
500.0000 [IU] | Freq: Once | INTRAVENOUS | Status: AC
  Administered 2022-08-10: 500 [IU] via INTRAVENOUS
  Filled 2022-08-10: qty 5

## 2022-08-10 MED ORDER — SODIUM CHLORIDE 0.9% FLUSH
10.0000 mL | Freq: Once | INTRAVENOUS | Status: AC
  Administered 2022-08-10: 10 mL via INTRAVENOUS
  Filled 2022-08-10: qty 10

## 2022-08-10 MED ORDER — HEPARIN SOD (PORK) LOCK FLUSH 100 UNIT/ML IV SOLN
500.0000 [IU] | Freq: Once | INTRAVENOUS | Status: DC | PRN
  Filled 2022-08-10: qty 5

## 2022-08-10 MED ORDER — SODIUM CHLORIDE 0.9 % IV SOLN
240.0000 mg | Freq: Once | INTRAVENOUS | Status: AC
  Administered 2022-08-10: 240 mg via INTRAVENOUS
  Filled 2022-08-10: qty 24

## 2022-08-10 NOTE — Patient Instructions (Signed)
[place] [facility]  Discharge Instructions: Thank you for choosing [facility] to provide your oncology and hematology care.  If you have a lab appointment with the Cancer Center, please go directly to the Cancer Center and check in at the registration area.  Wear comfortable clothing and clothing appropriate for easy access to any Portacath or PICC line.   We strive to give you quality time with your provider. You may need to reschedule your appointment if you arrive late (15 or more minutes).  Arriving late affects you and other patients whose appointments are after yours.  Also, if you miss three or more appointments without notifying the office, you may be dismissed from the clinic at the provider's discretion.      For prescription refill requests, have your pharmacy contact our office and allow 72 hours for refills to be completed.    Today you received the following chemotherapy and/or immunotherapy agents Opdivo & Yervoy      To help prevent nausea and vomiting after your treatment, we encourage you to take your nausea medication as directed.  BELOW ARE SYMPTOMS THAT SHOULD BE REPORTED IMMEDIATELY: *FEVER GREATER THAN 100.4 F (38 C) OR HIGHER *CHILLS OR SWEATING *NAUSEA AND VOMITING THAT IS NOT CONTROLLED WITH YOUR NAUSEA MEDICATION *UNUSUAL SHORTNESS OF BREATH *UNUSUAL BRUISING OR BLEEDING *URINARY PROBLEMS (pain or burning when urinating, or frequent urination) *BOWEL PROBLEMS (unusual diarrhea, constipation, pain near the anus) TENDERNESS IN MOUTH AND THROAT WITH OR WITHOUT PRESENCE OF ULCERS (sore throat, sores in mouth, or a toothache) UNUSUAL RASH, SWELLING OR PAIN  UNUSUAL VAGINAL DISCHARGE OR ITCHING   Items with * indicate a potential emergency and should be followed up as soon as possible or go to the Emergency Department if any problems should occur.  Please show the CHEMOTHERAPY ALERT CARD or IMMUNOTHERAPY ALERT CARD at  check-in to the Emergency Department and triage nurse.  Should you have questions after your visit or need to cancel or reschedule your appointment, please contact [place] [facility]  [phone] and follow the prompts.  Office hours are 8:00 a.m. to 4:30 p.m. Monday - Friday. Please note that voicemails left after 4:00 p.m. may not be returned until the following business day.  We are closed weekends and major holidays. You have access to a nurse at all times for urgent questions. Please call the main number to the clinic [phone] and follow the prompts.  For any non-urgent questions, you may also contact your provider using MyChart. We now offer e-Visits for anyone 28 and older to request care online for non-urgent symptoms. For details visit [email or web address].   Also download the MyChart app! Go to the app store, search "MyChart", open the app, select [place], and log in with your MyChart username and password.

## 2022-08-10 NOTE — Progress Notes (Signed)
Ipilimumab (YERVOY) Patient Monitoring Assessment   Is the patient experiencing any of the following general symptoms?:  [ ] Difficulty performing normal activities [ ] Feeling sluggish or cold all the time [ ] Unusual weight gain [ ] Constant or unusual headaches [ ] Feeling dizzy or faint [ ] Changes in eyesight (blurry vision, double vision, or other vision problems) [ ] Changes in mood or behavior (ex: decreased sex drive, irritability, or forgetfulness) [ ] Starting new medications (ex: steroids, other medications that lower immune response) [x ]Patient is not experiencing any of the general symptoms above.   Gastrointestinal  Patient is having 2 bowel movements each day.  Is this different from baseline? [ ] Yes [ ] No Are your stools watery or do they have a foul smell? [ ] Yes [x] No Have you seen blood in your stools? [ ] Yes [x] No Are your stools dark, tarry, or sticky? [ ] Yes [x ]No Are you having pain or tenderness in your belly? [ ] Yes [ x]No  Skin Does your skin itch? [ ] Yes [ x]No Do you have a rash? [ ] Yes [x ]No Has your skin blistered and/or peeled? [ ] Yes [x ]No Do you have sores in your mouth? [ ] Yes [x ]No  Hepatic Has your urine been dark or tea colored? [ ] Yes [ x]No Have you noticed that your skin or the whites of your eyes are turning yellow? [ ] Yes [ x]No Are you bleeding or bruising more easily than normal? [ ] Yes [ x]No Are you nauseous and/or vomiting? [ ] Yes [ x]No Do you have pain on the right side of your stomach? [ ] Yes [x ]No  Neurologic  Are you having unusual weakness of legs, arms, or face? [ ] Yes [x ]No Are you having numbness or tingling in your hands or feet? [ ] Yes [ x]No  Mayer Masker Melvia Matousek

## 2022-08-12 NOTE — Addendum Note (Signed)
Encounter addended by: Edward Qualia on: 08/12/2022 3:32 PM  Actions taken: Imaging Exam ended

## 2022-08-14 ENCOUNTER — Encounter: Payer: Self-pay | Admitting: Oncology

## 2022-08-14 NOTE — Progress Notes (Signed)
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  She is s/p I 125 plaque radiotherapy placed on 11/16/20 and removed 11/20/20 she sees Dr. Gwendalyn Ege from [facility] for the same.  Recently patient has been onIntraocular a Avastin injections as well.  Patient was seen by Dr. Servando Snare from GI for abnormal LFTs.As a part of his workup he had ordered an ultrasound abdomen which showed indeterminate hypoechoic lesions in the liver and MRI was recommended.  MRI liver with and without contrast showed diffuse hepatic replacement with heterogeneous T2 signal and enhancement favoring metastatic disease.  Given the clinical history some of these areas favor melanoma metastases.  Heterogenous marrow signal including a 1.4 cm L5 vertebral body lesion concerning for bone metastases.  Patient has been referred for further management   Family history significant for breast cancer in her paternal grandmother.  Father died of brain tumor.  Patient does not remember having melanoma involving her skin.  She states that about 35 years ago there was a lesion removed from the tip of her nose which was possibly cancer.  She lives with her husband and is independent of her ADLs and IADLs.  She reports mild low back pain but denies other complaints at this time   PET scan showed 2 discrete hypermetabolic liver masses with a background of  diffusely heterogeneous liver parenchymal density and nodular contour which may represent pseudocirrhotic appearance related to diffuse metastatic disease.  Faintly sclerotic L5 vertebral and proximal left humeral osseous metastases   Ultrasound-guided liver biopsy to target the hypermetabolic liver masses was attempted but since those masses were not clearly visible on the ultrasound random right-sided liver biopsies were taken and was consistent with metastatic malignant melanoma.  NGS testing showed no actionable mutations and BRAF testing negative.  PD-L1 less than 1%.  Tumor mutational burden not high and MSI stablePatient was also seen in [facility] for [facility] opinion combination ipilimumab and nivolumab was recommended  Interval history-patient reports feeling fatigued but denies other complaints at this time.  No recent falls.  Denies any nausea vomiting or diarrhea  ECOG PS- 1 Pain scale- 0   Review of systems- Review of Systems  Constitutional:  Positive for malaise/fatigue. Negative for chills, fever and weight loss.  HENT:  Negative for congestion, ear discharge and nosebleeds.   Eyes:  Negative for blurred vision.  Respiratory:  Negative for cough, hemoptysis, sputum production, shortness of breath and wheezing.   Cardiovascular:  Negative for chest pain, palpitations, orthopnea and claudication.  Gastrointestinal:  Negative for abdominal pain, blood in stool, constipation, diarrhea, heartburn, melena, nausea and vomiting.  Genitourinary:  Negative for dysuria, flank pain, frequency, hematuria and urgency.  Musculoskeletal:  Negative for back pain, joint pain and myalgias.  Skin:  Negative for rash.  Neurological:  Negative for dizziness, tingling, focal weakness, seizures, weakness and headaches.  Endo/Heme/Allergies:  Does not bruise/bleed easily.  Psychiatric/Behavioral:  Negative for depression and suicidal ideas. The patient  does not have insomnia.       Allergies  Allergen  Reactions   Latex Rash    From Bandaids   Sulfa Antibiotics Nausea Only     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HCC)    GERD (gastroesophageal reflux disease)    Hyperlipidemia    Hypothyroidism    Insomnia      Past Surgical History:  Procedure Laterality Date   ABDOMINAL HYSTERECTOMY     CATARACT EXTRACTION W/ INTRAOCULAR LENS  IMPLANT, BILATERAL     COLONOSCOPY WITH PROPOFOL N/A 12/17/2015   Procedure: COLONOSCOPY WITH PROPOFOL;  Surgeon: Midge Minium, MD;  Location: [facility];  Service: Endoscopy;  Laterality: N/A;   COLONOSCOPY WITH PROPOFOL N/A 02/23/2021   Procedure: COLONOSCOPY WITH PROPOFOL;  Surgeon: Midge Minium, MD;  Location[facility] END[facility];  Service: Endoscopy;  Laterality: N/A;   COLONOSCOPY WITH PROPOFOL N/A 07/20/2021   Procedure: COLONOSCOPY WITH PROPOFOL;  Surgeon: Midge Minium, MD;  Location: [facility] ENDOSCOPY;  Service: Endoscopy;  Laterality: N/A;   ESOPHAGOGASTRODUODENOSCOPY (EGD) WITH PROPOFOL N/A 12/17/2015   Procedure: ESOPHAGOGASTRODUODENOSCOPY (EGD) WITH PROPOFOL;  Surgeon: Midge Minium, MD;  Location: [facility];  Service: Endoscopy;  Laterality: N/A;  Latex sensitivity   EYE SURGERY     IR IMAGING GUIDED PORT INSERTION  07/15/2022   NASAL SINUS SURGERY     POLYPECTOMY N/A 12/17/2015   Procedure: POLYPECTOMY;  Surgeon: Midge Minium, MD;  Location: [facility];  Service: Endoscopy;  Laterality: N/A;   ROOT CANAL     TUBAL LIGATION      Social History   Socioeconomic History   Marital status: Married    Spouse name: Not on file   Number of children: Not on file   Years of education: Not on file   Highest education level: Not on file  Occupational History   Not on file  Tobacco Use   Smoking status: Never   Smokeless tobacco: Never  Vaping Use   Vaping status: Never Used  Substance and Sexual Activity   Alcohol use: No   Drug use: No   Sexual activity: Not Currently    Birth  control/protection: None  Other Topics Concern   Not on file  Social History Narrative   Not on file   Social Determinants of Health   Financial Resource Strain: Not on file  Food Insecurity: Not on file  Transportation Needs: Not on file  Physical Activity: Not on file  Stress: Not on file  Social Connections: Not on file  Intimate Partner Violence: Not on file    Family History  Problem Relation Age of Onset   Coronary artery disease Mother    Cancer Father    Coronary artery disease Father    Osteoarthritis Brother    Cancer Brother      Current Outpatient Medications:    acetaminophen (TYLENOL) 650 MG CR tablet, Take 1,300 mg by mouth., Disp: , Rfl:    Calcium Carbonate-Vitamin D 600-400 MG-UNIT tablet, Take by mouth., Disp: , Rfl:    Cyanocobalamin 1000 MCG TBCR, Take by mouth., Disp: , Rfl:    diphenhydrAMINE (BENADRYL) 25 mg capsule, Take by mouth., Disp: , Rfl:    docusate sodium (COLACE) 100 MG capsule, Take 100 mg by mouth., Disp: , Rfl:    ibuprofen (ADVIL) 600 MG tablet, Take 600 mg by mouth 3 (three) times daily., Disp: , Rfl:    levothyroxine (SYNTHROID, LEVOTHROID) 50 MCG tablet,  TAKE 1 TABLET (50 MCG TOTAL) BY MOUTH ONCE DAILY., Disp: , Rfl:    lidocaine-prilocaine (EMLA) cream, Apply to affected area once, Disp: 30 g, Rfl: 3   Omega-3 Fatty Acids (FISH OIL PO), Take by mouth., Disp: , Rfl:    ondansetron (ZOFRAN) 8 MG tablet, Take 1 tablet (8 mg total) by mouth every 8 (eight) hours as needed for nausea or vomiting., Disp: 30 tablet, Rfl: 1   pantoprazole (PROTONIX) 40 MG tablet, Take 40 mg by mouth 2 (two) times daily., Disp: , Rfl:    pravastatin (PRAVACHOL) 20 MG tablet, Take by mouth., Disp: , Rfl:    prochlorperazine (COMPAZINE) 10 MG tablet, Take 1 tablet (10 mg total) by mouth every 6 (six) hours as needed for nausea or vomiting., Disp: 30 tablet, Rfl: 1   sodium chloride (OCEAN) 0.65 % nasal spray, Place into the nose., Disp: , Rfl:   Physical exam:   Vitals:   08/10/22 0919  BP: 114/72  Pulse: 95  Resp: 18  Temp: 98.4 F (36.9 C)  TempSrc: Tympanic  SpO2: 96%  Weight: 169 lb 11.2 oz (77 kg)  Height: 5\' 5"  (1.651 m)   Physical Exam Cardiovascular:     Rate and Rhythm: Normal rate and regular rhythm.     Heart sounds: Normal heart sounds.  Pulmonary:     Effort: Pulmonary effort is normal.     Breath sounds: Normal breath sounds.  Abdominal:     General: Bowel sounds are normal.     Palpations: Abdomen is soft.  Skin:    General: Skin is warm and dry.  Neurological:     Mental Status: She is alert and oriented to person, place, and time.         Latest Ref Rng & Units 08/10/2022    9:07 AM  CMP  Glucose 70 - 99 mg/dL 440   BUN 8 - 23 mg/dL 13   Creatinine 3.47 - 1.00 mg/dL 4.25   Sodium 956 - 387 mmol/L 133   Potassium 3.5 - 5.1 mmol/L 3.8   Chloride 98 - 111 mmol/L 102   CO2 22 - 32 mmol/L 27   Calcium 8.9 - 10.3 mg/dL 8.2   Total Protein 6.5 - 8.1 g/dL 5.3   Total Bilirubin 0.3 - 1.2 mg/dL 1.6   Alkaline Phos 38 - 126 U/L 186   AST 15 - 41 U/L 102   ALT 0 - 44 U/L 67       Latest Ref Rng & Units 08/10/2022    9:07 AM  CBC  WBC 4.0 - 10.5 K/uL 7.6   Hemoglobin 12.0 - 15.0 g/dL 56.4   Hematocrit 33.2 - 46.0 % 42.1   Platelets 150 - 400 K/uL 240     No images are attached to the encounter.  NM PET Image Restag (PS) Skull Base To Thigh  Result Date: 08/10/2022 CLINICAL DATA:  Subsequent treatment strategy for metastatic melanoma. EXAM: NUCLEAR MEDICINE PET SKULL BASE TO THIGH TECHNIQUE: 9.04 mCi F-18 FDG was injected intravenously. Full-ring PET imaging was performed from the skull base to thigh after the radiotracer. CT data was obtained and used for attenuation correction and anatomic localization. Fasting blood glucose: 103 mg/dl COMPARISON:  PET-CT 95/18/8416.  Abdominal MRI 05/23/2022. FINDINGS: Mediastinal blood pool activity: SUV max 2.8 NECK: No hypermetabolic cervical lymph nodes are identified.  No suspicious activity identified within the pharyngeal mucosal space. Incidental CT findings: none CHEST: Patient has developed numerous hypermetabolic mediastinal and hilar lymph  nodes bilaterally. There is a precarinal node measuring 1.4 cm short axis on image 55/4 which has an SUV max of 9.5. No other significantly enlarged lymph nodes are identified on noncontrast imaging. Additional nodes include a right hilar node with an SUV max of 8.2 and a left hilar node with an SUV max of 8.8. No axillary adenopathy. No hypermetabolic pulmonary activity or suspicious nodularity. Incidental CT findings: Right IJ Port-A-Cath extends to the mid right atrium. Mild aortic and coronary artery atherosclerosis. Stable chronic lung disease with scattered bullous changes and parenchymal scarring. ABDOMEN/PELVIS: Previously demonstrated dominant hypermetabolic lesion anteriorly in the dome of the right hepatic lobe has increased in size and metabolic activity. The lesion is not well visualized on the CT images, but the area of hypermetabolic activity measures approximately 4.1 cm in greatest dimension and has an SUV max of 10.0 (previously 9.8). At least 2 other enlarging hypermetabolic liver lesions are seen. No hypermetabolic activity within the adrenal glands, spleen or pancreas. There is no hypermetabolic nodal activity in the abdomen or pelvis. Low level activity within lymph nodes in the porta hepatis is probably physiologic. Bowel activity within physiologic limits. 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 Aortic and branch vessel atherosclerosis. SKELETON: Persistent hypermetabolic lesion within the L5 vertebral body. This lesion measures approximately 1.0 cm in diameter and is sclerotic (image 120/4) with an SUV max of 6.7 (previously 6.6). Previously noted small focus of hypermetabolic activity in the proximal left humerus appears less obvious. No new osseous lesions identified.  Incidental CT findings: none IMPRESSION: 1. Interval enlargement of previously demonstrated hypermetabolic hepatic lesions consistent with progressive metastatic disease. 2. Interval development of hypermetabolic mediastinal and hilar lymph nodes, most consistent with metastatic disease. 3. Persistent hypermetabolic lesion within the L5 vertebral body. No new osseous lesions identified. 4.  Aortic Atherosclerosis (ICD10-I70.0). Electronically Signed   By: Carey Bullocks M.D.   On: 08/10/2022 14:03     Assessment and plan- Patient is a 77 y.o. female with metastatic uveal melanoma with liver metastases here for on treatment assessment prior to cycle 3 of ipilimumab and nivolumab  I have reviewed PET CT scan images independently which is concerning for disease progression but I was waiting for official PET scan results to be reported at the time of my visit.  Counts otherwise okay to proceed with cycle 3 of ipilimumab and nivolumab today.  After the patient left the clinic PET CT scan results were reported and were concerning for disease progression in the liver as well as newMediastinal adenopathy.  There is still a persistent hypermetabolism in the L5 vertebral body but patient does not complain of any back pain presently.  With PD-L1 of 1% and low tumor mutational burden unfortunately patient has not responded well to combination immunotherapy.  She is also BRAF negative.  I am planning to get in touch with Dr. Harold Hedge from [facility] to see if he has any clinical trials for her and if you would suggest anything else a second line treatments as overall her prognosis is poor  Abnormal LFTs: They have been abnormal even prior to starting treatment and have remained more or less stable   Visit Diagnosis 1. Encounter for antineoplastic immunotherapy   2. 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 Melanoma metastatic to liver [facility])      Dr. Owens Shark, MD, MPH [facility] [phone] 08/14/2022 7:55 PM

## 2022-08-16 ENCOUNTER — Telehealth: Payer: Self-pay | Admitting: *Deleted

## 2022-08-16 NOTE — Telephone Encounter (Signed)
RN returned pt call, she states she has had diarrhea that has resolved but is having a difficult time with painful hemorrhoids and swallowing pills.  Pt states she does have OTC hemorrhoid cream and nurse instructed her to start using. Nurse also instructed pt to try and take pills with some applesauce to try to make it easier to swallow. RN offered to have [facility] call pt for possible appt but pt refused stated she would try above suggestions first and called back if needed to be seen.  Pt also inquired about PET scan results and requests call back.  Attached below.    Narrative & Impression  CLINICAL DATA:  Subsequent treatment strategy for metastatic melanoma.   EXAM: NUCLEAR MEDICINE PET SKULL BASE TO THIGH   TECHNIQUE: 9.04 mCi F-18 FDG was injected intravenously. Full-ring PET imaging was performed from the skull base to thigh after the radiotracer. CT data was obtained and used for attenuation correction and anatomic localization.   Fasting blood glucose: 103 mg/dl   COMPARISON:  PET-CT 29/56/2130.  Abdominal MRI 05/23/2022.   FINDINGS: Mediastinal blood pool activity: SUV max 2.8   NECK:   No hypermetabolic cervical lymph nodes are identified. No suspicious activity identified within the pharyngeal mucosal space.   Incidental CT findings: none   CHEST:   Patient has developed numerous hypermetabolic mediastinal and hilar lymph nodes bilaterally. There is a precarinal node measuring 1.4 cm short axis on image 55/4 which has an SUV max of 9.5. No other significantly enlarged lymph nodes are identified on noncontrast imaging. Additional nodes include a right hilar node with an SUV max of 8.2 and a left hilar node with an SUV max of 8.8. No axillary adenopathy. No hypermetabolic pulmonary activity or suspicious nodularity.   Incidental CT findings: Right IJ Port-A-Cath extends to the mid right atrium. Mild aortic and coronary artery atherosclerosis. Stable chronic lung  disease with scattered bullous changes and parenchymal scarring.   ABDOMEN/PELVIS:   Previously demonstrated dominant hypermetabolic lesion anteriorly in the dome of the right hepatic lobe has increased in size and metabolic activity. The lesion is not well visualized on the CT images, but the area of hypermetabolic activity measures approximately 4.1 cm in greatest dimension and has an SUV max of 10.0 (previously 9.8). At least 2 other enlarging hypermetabolic liver lesions are seen. No hypermetabolic activity within the adrenal glands, spleen or pancreas. There is no hypermetabolic nodal activity in the abdomen or pelvis. Low level activity within lymph nodes in the porta hepatis is probably physiologic. Bowel activity within physiologic limits.   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 Aortic and branch vessel atherosclerosis.   SKELETON:   Persistent hypermetabolic lesion within the L5 vertebral body. This lesion measures approximately 1.0 cm in diameter and is sclerotic (image 120/4) with an SUV max of 6.7 (previously 6.6). Previously noted small focus of hypermetabolic activity in the proximal left humerus appears less obvious. No new osseous lesions identified.   Incidental CT findings: none   IMPRESSION: 1. Interval enlargement of previously demonstrated hypermetabolic hepatic lesions consistent with progressive metastatic disease. 2. Interval development of hypermetabolic mediastinal and hilar lymph nodes, most consistent with metastatic disease. 3. Persistent hypermetabolic lesion within the L5 vertebral body. No new osseous lesions identified. 4.  Aortic Atherosclerosis (ICD10-I70.0).     Electronically Signed   By: Carey Bullocks M.D.   On: 08/10/2022 14:03

## 2022-08-16 NOTE — Telephone Encounter (Signed)
I have emailed Dr. Harold Hedge from [facility]. Not heard back. Can you f/u on this today with [facility]? I will call patient today

## 2022-08-17 ENCOUNTER — Encounter: Payer: Self-pay | Admitting: Oncology

## 2022-08-19 DIAGNOSIS — R1310 Dysphagia, unspecified: Secondary | ICD-10-CM | POA: Diagnosis not present

## 2022-08-19 DIAGNOSIS — R627 Adult failure to thrive: Secondary | ICD-10-CM | POA: Diagnosis not present

## 2022-08-19 DIAGNOSIS — C439 Malignant melanoma of skin, unspecified: Secondary | ICD-10-CM | POA: Diagnosis not present

## 2022-08-19 DIAGNOSIS — Z79899 Other long term (current) drug therapy: Secondary | ICD-10-CM | POA: Diagnosis not present

## 2022-08-22 ENCOUNTER — Telehealth: Payer: Self-pay

## 2022-08-22 NOTE — Telephone Encounter (Signed)
Pt already scheduled with [facility] for 8/14

## 2022-08-22 NOTE — Telephone Encounter (Signed)
Creig Hines, MD  good morning Dr. Servando Snare and Ginger. This is a patient of mine with metastatic melanoma who needs to start a new rx at [facility] called [facility]. she needs EGD prior. Can your office arrange this asap? thank you

## 2022-08-24 DIAGNOSIS — Z79899 Other long term (current) drug therapy: Secondary | ICD-10-CM | POA: Diagnosis not present

## 2022-08-24 DIAGNOSIS — C787 Secondary malignant neoplasm of liver and intrahepatic bile duct: Secondary | ICD-10-CM | POA: Diagnosis not present

## 2022-08-24 DIAGNOSIS — R1310 Dysphagia, unspecified: Secondary | ICD-10-CM | POA: Diagnosis not present

## 2022-08-24 DIAGNOSIS — E039 Hypothyroidism, unspecified: Secondary | ICD-10-CM | POA: Diagnosis not present

## 2022-08-24 DIAGNOSIS — K3189 Other diseases of stomach and duodenum: Secondary | ICD-10-CM | POA: Diagnosis not present

## 2022-08-24 DIAGNOSIS — K219 Gastro-esophageal reflux disease without esophagitis: Secondary | ICD-10-CM | POA: Diagnosis not present

## 2022-08-24 DIAGNOSIS — Z882 Allergy status to sulfonamides status: Secondary | ICD-10-CM | POA: Diagnosis not present

## 2022-08-24 DIAGNOSIS — Z79890 Hormone replacement therapy (postmenopausal): Secondary | ICD-10-CM | POA: Diagnosis not present

## 2022-08-26 NOTE — Telephone Encounter (Signed)
She is all set with [facility] now. She will be gettign treatments there. Cancel all appointments here but patient can call us if she needs anything

## 2022-08-28 ENCOUNTER — Ambulatory Visit
Admission: EM | Admit: 2022-08-28 | Discharge: 2022-08-28 | Disposition: A | Discharge status: Home / Self Care | Payer: PPO

## 2022-08-28 ENCOUNTER — Emergency Department: Payer: PPO

## 2022-08-28 ENCOUNTER — Inpatient Hospital Stay: Payer: PPO

## 2022-08-28 ENCOUNTER — Inpatient Hospital Stay
Admission: EM | Admit: 2022-08-28 | Discharge: 2022-09-01 | DRG: 392 | Disposition: A | Discharge status: Home Health | Payer: PPO | Attending: Internal Medicine | Admitting: Internal Medicine

## 2022-08-28 ENCOUNTER — Other Ambulatory Visit: Payer: Self-pay

## 2022-08-28 DIAGNOSIS — R0602 Shortness of breath: Secondary | ICD-10-CM

## 2022-08-28 DIAGNOSIS — J9811 Atelectasis: Secondary | ICD-10-CM | POA: Diagnosis present

## 2022-08-28 DIAGNOSIS — C6930 Malignant neoplasm of unspecified choroid: Secondary | ICD-10-CM | POA: Diagnosis not present

## 2022-08-28 DIAGNOSIS — Z66 Do not resuscitate: Secondary | ICD-10-CM | POA: Diagnosis not present

## 2022-08-28 DIAGNOSIS — I2489 Other forms of acute ischemic heart disease: Secondary | ICD-10-CM | POA: Diagnosis not present

## 2022-08-28 DIAGNOSIS — Z85840 Personal history of malignant neoplasm of eye: Secondary | ICD-10-CM

## 2022-08-28 DIAGNOSIS — K529 Noninfective gastroenteritis and colitis, unspecified: Secondary | ICD-10-CM | POA: Diagnosis not present

## 2022-08-28 DIAGNOSIS — Z79899 Other long term (current) drug therapy: Secondary | ICD-10-CM | POA: Diagnosis not present

## 2022-08-28 DIAGNOSIS — Z9221 Personal history of antineoplastic chemotherapy: Secondary | ICD-10-CM

## 2022-08-28 DIAGNOSIS — C439 Malignant melanoma of skin, unspecified: Secondary | ICD-10-CM

## 2022-08-28 DIAGNOSIS — Z91040 Latex allergy status: Secondary | ICD-10-CM

## 2022-08-28 DIAGNOSIS — M1990 Unspecified osteoarthritis, unspecified site: Secondary | ICD-10-CM | POA: Diagnosis present

## 2022-08-28 DIAGNOSIS — R2243 Localized swelling, mass and lump, lower limb, bilateral: Secondary | ICD-10-CM

## 2022-08-28 DIAGNOSIS — I2510 Atherosclerotic heart disease of native coronary artery without angina pectoris: Secondary | ICD-10-CM | POA: Diagnosis not present

## 2022-08-28 DIAGNOSIS — Z79890 Hormone replacement therapy (postmenopausal): Secondary | ICD-10-CM | POA: Diagnosis not present

## 2022-08-28 DIAGNOSIS — Z90710 Acquired absence of both cervix and uterus: Secondary | ICD-10-CM

## 2022-08-28 DIAGNOSIS — R109 Unspecified abdominal pain: Secondary | ICD-10-CM | POA: Diagnosis not present

## 2022-08-28 DIAGNOSIS — R590 Localized enlarged lymph nodes: Secondary | ICD-10-CM | POA: Diagnosis not present

## 2022-08-28 DIAGNOSIS — Z7982 Long term (current) use of aspirin: Secondary | ICD-10-CM

## 2022-08-28 DIAGNOSIS — E039 Hypothyroidism, unspecified: Secondary | ICD-10-CM | POA: Diagnosis present

## 2022-08-28 DIAGNOSIS — M79605 Pain in left leg: Secondary | ICD-10-CM | POA: Diagnosis not present

## 2022-08-28 DIAGNOSIS — E785 Hyperlipidemia, unspecified: Secondary | ICD-10-CM | POA: Diagnosis present

## 2022-08-28 DIAGNOSIS — I214 Non-ST elevation (NSTEMI) myocardial infarction: Secondary | ICD-10-CM

## 2022-08-28 DIAGNOSIS — Z8249 Family history of ischemic heart disease and other diseases of the circulatory system: Secondary | ICD-10-CM | POA: Diagnosis not present

## 2022-08-28 DIAGNOSIS — R918 Other nonspecific abnormal finding of lung field: Secondary | ICD-10-CM | POA: Diagnosis not present

## 2022-08-28 DIAGNOSIS — C6941 Malignant neoplasm of right ciliary body: Secondary | ICD-10-CM

## 2022-08-28 DIAGNOSIS — M7989 Other specified soft tissue disorders: Secondary | ICD-10-CM | POA: Diagnosis present

## 2022-08-28 DIAGNOSIS — R197 Diarrhea, unspecified: Secondary | ICD-10-CM | POA: Diagnosis not present

## 2022-08-28 DIAGNOSIS — R1030 Lower abdominal pain, unspecified: Secondary | ICD-10-CM

## 2022-08-28 DIAGNOSIS — K219 Gastro-esophageal reflux disease without esophagitis: Secondary | ICD-10-CM | POA: Diagnosis not present

## 2022-08-28 DIAGNOSIS — Z7189 Other specified counseling: Secondary | ICD-10-CM | POA: Diagnosis not present

## 2022-08-28 DIAGNOSIS — C787 Secondary malignant neoplasm of liver and intrahepatic bile duct: Secondary | ICD-10-CM | POA: Diagnosis present

## 2022-08-28 DIAGNOSIS — J984 Other disorders of lung: Secondary | ICD-10-CM | POA: Diagnosis not present

## 2022-08-28 DIAGNOSIS — R1310 Dysphagia, unspecified: Secondary | ICD-10-CM | POA: Diagnosis not present

## 2022-08-28 DIAGNOSIS — Z515 Encounter for palliative care: Secondary | ICD-10-CM | POA: Diagnosis not present

## 2022-08-28 DIAGNOSIS — R112 Nausea with vomiting, unspecified: Secondary | ICD-10-CM | POA: Diagnosis not present

## 2022-08-28 DIAGNOSIS — R5381 Other malaise: Secondary | ICD-10-CM | POA: Diagnosis present

## 2022-08-28 DIAGNOSIS — R531 Weakness: Secondary | ICD-10-CM | POA: Diagnosis not present

## 2022-08-28 DIAGNOSIS — R7989 Other specified abnormal findings of blood chemistry: Secondary | ICD-10-CM | POA: Diagnosis not present

## 2022-08-28 LAB — TSH: TSH: 4.132 u[IU]/mL (ref 0.350–4.500)

## 2022-08-28 LAB — TROPONIN I (HIGH SENSITIVITY)
Troponin I (High Sensitivity): 319 ng/L (ref <18)
Troponin I (High Sensitivity): 333 ng/L (ref <18)
Troponin I (High Sensitivity): 359 ng/L (ref <18)
Troponin I (High Sensitivity): 381 ng/L (ref <18)

## 2022-08-28 LAB — BASIC METABOLIC PANEL
Anion gap: 9 (ref 5–15)
BUN: 10 mg/dL (ref 8–23)
CO2: 25 mmol/L (ref 22–32)
Calcium: 8.3 mg/dL — ABNORMAL LOW (ref 8.9–10.3)
Chloride: 101 mmol/L (ref 98–111)
Creatinine, Ser: 0.73 mg/dL (ref 0.44–1.00)
GFR, Estimated: >60 mL/min (ref >60)
Glucose, Bld: 110 mg/dL — ABNORMAL HIGH (ref 70–99)
Potassium: 3.8 mmol/L (ref 3.5–5.1)
Sodium: 135 mmol/L (ref 135–145)

## 2022-08-28 LAB — HEPATIC FUNCTION PANEL
ALT: 109 U/L — ABNORMAL HIGH (ref 0–44)
AST: 164 U/L — ABNORMAL HIGH (ref 15–41)
Albumin: 2.8 g/dL — ABNORMAL LOW (ref 3.5–5.0)
Alkaline Phosphatase: 160 U/L — ABNORMAL HIGH (ref 38–126)
Bilirubin, Direct: 0.7 mg/dL — ABNORMAL HIGH (ref 0.0–0.2)
Indirect Bilirubin: 2 mg/dL — ABNORMAL HIGH (ref 0.3–0.9)
Total Bilirubin: 2.7 mg/dL — ABNORMAL HIGH (ref 0.3–1.2)
Total Protein: 5.3 g/dL — ABNORMAL LOW (ref 6.5–8.1)

## 2022-08-28 LAB — CBC
HCT: 47.1 % — ABNORMAL HIGH (ref 36.0–46.0)
Hemoglobin: 15.5 g/dL — ABNORMAL HIGH (ref 12.0–15.0)
MCH: 30.3 pg (ref 26.0–34.0)
MCHC: 32.9 g/dL (ref 30.0–36.0)
MCV: 92 fL (ref 80.0–100.0)
Platelets: 300 10*3/uL (ref 150–400)
RBC: 5.12 MIL/uL — ABNORMAL HIGH (ref 3.87–5.11)
RDW: 15.3 % (ref 11.5–15.5)
WBC: 10.6 10*3/uL — ABNORMAL HIGH (ref 4.0–10.5)
nRBC: 0 % (ref 0.0–0.2)

## 2022-08-28 LAB — LIPASE, BLOOD: Lipase: 35 U/L (ref 11–51)

## 2022-08-28 LAB — PROTIME-INR
INR: 1.4 — ABNORMAL HIGH (ref 0.8–1.2)
Prothrombin Time: 17.8 seconds — ABNORMAL HIGH (ref 11.4–15.2)

## 2022-08-28 LAB — APTT: aPTT: 36 seconds (ref 24–36)

## 2022-08-28 LAB — BRAIN NATRIURETIC PEPTIDE: B Natriuretic Peptide: 41.7 pg/mL (ref 0.0–100.0)

## 2022-08-28 LAB — LACTIC ACID, PLASMA: Lactic Acid, Venous: 1.2 mmol/L (ref 0.5–1.9)

## 2022-08-28 MED ORDER — METRONIDAZOLE 500 MG/100ML IV SOLN
500.0000 mg | Freq: Two times a day (BID) | INTRAVENOUS | Status: DC
  Administered 2022-08-29 – 2022-09-01 (×8): 500 mg via INTRAVENOUS
  Filled 2022-08-28 (×8): qty 100

## 2022-08-28 MED ORDER — SODIUM CHLORIDE 0.9 % IV SOLN
1.0000 g | INTRAVENOUS | Status: DC
  Administered 2022-08-29 – 2022-09-01 (×4): 1 g via INTRAVENOUS
  Filled 2022-08-28 (×4): qty 10

## 2022-08-28 MED ORDER — ASPIRIN 81 MG PO TBEC
81.0000 mg | DELAYED_RELEASE_TABLET | Freq: Every day | ORAL | Status: DC
  Administered 2022-08-29 – 2022-08-30 (×2): 81 mg via ORAL
  Filled 2022-08-28 (×2): qty 1

## 2022-08-28 MED ORDER — PIPERACILLIN-TAZOBACTAM 3.375 G IVPB 30 MIN
3.3750 g | Freq: Once | INTRAVENOUS | Status: AC
  Administered 2022-08-28: 3.375 g via INTRAVENOUS
  Filled 2022-08-28: qty 50

## 2022-08-28 MED ORDER — IOHEXOL 350 MG/ML SOLN
80.0000 mL | Freq: Once | INTRAVENOUS | Status: AC | PRN
  Administered 2022-08-28: 80 mL via INTRAVENOUS

## 2022-08-28 MED ORDER — ACETAMINOPHEN 325 MG PO TABS: 650.0000 mg | ORAL_TABLET | ORAL | Status: DC | PRN

## 2022-08-28 MED ORDER — ASPIRIN 81 MG PO CHEW
324.0000 mg | CHEWABLE_TABLET | ORAL | Status: AC
  Administered 2022-08-28: 324 mg via ORAL
  Filled 2022-08-28: qty 4

## 2022-08-28 MED ORDER — METOPROLOL TARTRATE 25 MG PO TABS
12.5000 mg | ORAL_TABLET | Freq: Two times a day (BID) | ORAL | Status: DC
  Administered 2022-08-28 – 2022-09-01 (×7): 12.5 mg via ORAL
  Filled 2022-08-28 (×7): qty 1

## 2022-08-28 MED ORDER — HEPARIN BOLUS VIA INFUSION
4000.0000 [IU] | Freq: Once | INTRAVENOUS | Status: AC
  Administered 2022-08-28: 4000 [IU] via INTRAVENOUS
  Filled 2022-08-28: qty 4000

## 2022-08-28 MED ORDER — HEPARIN (PORCINE) 25000 UT/250ML-% IV SOLN
1300.0000 [IU]/h | INTRAVENOUS | Status: DC
  Administered 2022-08-28: 900 [IU]/h via INTRAVENOUS
  Administered 2022-08-29: 1300 [IU]/h via INTRAVENOUS
  Filled 2022-08-28 (×2): qty 250

## 2022-08-28 MED ORDER — SODIUM CHLORIDE 0.9 % IV SOLN: INTRAVENOUS | Status: AC

## 2022-08-28 MED ORDER — ONDANSETRON HCL 4 MG/2ML IJ SOLN
4.0000 mg | Freq: Four times a day (QID) | INTRAMUSCULAR | Status: DC | PRN
  Administered 2022-08-31 – 2022-09-01 (×2): 4 mg via INTRAVENOUS
  Filled 2022-08-28 (×2): qty 2

## 2022-08-28 MED ORDER — PANTOPRAZOLE SODIUM 40 MG IV SOLR
40.0000 mg | INTRAVENOUS | Status: DC
  Administered 2022-08-28 – 2022-08-31 (×4): 40 mg via INTRAVENOUS
  Filled 2022-08-28 (×4): qty 10

## 2022-08-28 MED ORDER — ASPIRIN 300 MG RE SUPP: 300.0000 mg | RECTAL | Status: AC

## 2022-08-28 MED ORDER — NITROGLYCERIN 0.4 MG SL SUBL: 0.4000 mg | SUBLINGUAL_TABLET | SUBLINGUAL | Status: DC | PRN

## 2022-08-28 NOTE — ED Provider Notes (Signed)
[facility] Provider Note    Event Date/Time   First MD Initiated Contact with Patient 08/28/22 1553     (approximate)  History   Chief Complaint: Shortness of Breath  HPI  Christine Savage is a 77 y.o. female with a past medical history of arthritis, hyperlipidemia, gastric reflux, metastatic ocular melanoma who presents to the emergency department with 2 weeks of shortness of breath lower extremity swelling intermittent nausea diarrhea and abdominal pain.  Denies any chest pain.  No known fever.  Patient states a history of ocular melanoma I reviewed the patient's [facility] note from 8/8.  Patient's care is being transferred from Dr. Smith Robert to [facility].  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  Patient has gone through immunotherapy and is scheduled to start chemotherapy this coming week.  However for the past 2 weeks she has noted increased swelling in her legs shortness of breath with minimal exertion.  Patient denies any chest pain no fever.  No cough.  She does describe abdominal discomfort which has been ongoing for a month or 2 now with intermittent diarrhea and nausea.  Physical Exam   Triage Vital Signs: ED Triage Vitals  Encounter Vitals Group     BP 08/28/22 1421 122/87     Systolic BP Percentile --      Diastolic BP Percentile --      Pulse Rate 08/28/22 1421 (!) 108     Resp 08/28/22 1421 18     Temp 08/28/22 1421 98.7 F (37.1 C)     Temp Source 08/28/22 1421 Oral     SpO2 08/28/22 1421 94 %     Weight 08/28/22 1422 169 lb (76.7 kg)     Height 08/28/22 1422 5\' 5"  (1.651 m)     Head Circumference --      Peak Flow --      Pain Score 08/28/22 1422 4     Pain Loc --      Pain Education --      Exclude from Growth Chart --     Most recent vital signs: Vitals:   08/28/22 1421 08/28/22 1545  BP: 122/87 (!) 149/88  Pulse: (!) 108   Resp: 18 15  Temp: 98.7 F (37.1 C)   SpO2: 94% 96%    General: Awake, no distress.  CV:  Good  peripheral perfusion.  Regular rate and rhythm  Resp:  Normal effort.  Equal breath sounds bilaterally.  Abd:  No distention.  Soft, nontender.  No rebound or guarding. Other:  Lower extreme edema bilaterally 1-2+, appears fairly equal although the patient states she feels her left leg is more swollen than her right.  Neurovascular intact distally.  ED Results / Procedures / Treatments   EKG  EKG viewed and interpreted by myself shows sinus tachycardia at 102 bpm with a narrow QRS, left axis deviation, largely normal intervals with nonspecific ST changes.  RADIOLOGY  I have reviewed and interpreted CTA images I do not see any obvious large pulmonary emboli. Radiologist read the CT scans no PE.  Moderate bilateral atelectasis.  Numerous enlarged mediastinal lymph nodes. CT scan abdomen/pelvis shows mild wall thickening of the cecum with stranding concerning for possible colitis   MEDICATIONS ORDERED IN ED: Medications - No data to display   IMPRESSION / MDM / ASSESSMENT AND PLAN / ED COURSE  I reviewed the triage vital signs and the nursing notes.  Patient's presentation is most consistent with acute  presentation with potential threat to life or bodily function.  Patient with a past medical history of metastatic ocular melanoma presents to the emergency department for shortness of breath lower extremity swelling.  Patient does have lower extremity edema bilaterally.  Patient's lab work has resulted showing a reassuring chemistry with normal renal function, overall reassuring CBC with a normal H&H.  Patient's troponin is elevated at 319 with a BNP that is normal at 41.  Given the patient's shortness of breath and lower extremity edema with elevated troponin we will obtain a CTA of the chest to rule out pulmonary embolism.  Also on the differential would include cardiomyopathy/CHF with peripheral edema, ACS.  We will discuss with oncology to ensure that we are able to start the patient on  heparin given her metastatic melanoma.  Given the patient's abdominal pain we will proceed with CT imaging of the abdomen as well.  Patient agreeable to plan of care.  CT scan show likely colitis.  No chest evidence of PE.  Given the patient is elevated troponin suspect the patient could be suffering an NSTEMI/cardiomyopathy leading to peripheral edema.  I spoke to Dr. Cathie Hoops of hematology to ensure that we are able to start the patient on heparin given her metastatic melanoma.  She states no contraindication given melanoma.  Will start the patient on a heparin infusion.  Will admit to the hospital service for further workup and treatment.  Will also cover with IV antibiotics for likely colitis.  CRITICAL CARE Performed by: Minna Antis   Total critical care time: 30 minutes  Critical care time was exclusive of separately billable procedures and treating other patients.  Critical care was necessary to treat or prevent imminent or life-threatening deterioration.  Critical care was time spent personally by me on the following activities: development of treatment plan with patient and/or surrogate as well as nursing, discussions with consultants, evaluation of patient's response to treatment, examination of patient, obtaining history from patient or surrogate, ordering and performing treatments and interventions, ordering and review of laboratory studies, ordering and review of radiographic studies, pulse oximetry and re-evaluation of patient's condition.   FINAL CLINICAL IMPRESSION(S) / ED DIAGNOSES   NSTEMI Colitis Peripheral edema   Note:  This document was prepared using Dragon voice recognition software and may include unintentional dictation errors.   Minna Antis, MD 08/28/22 1818

## 2022-08-28 NOTE — ED Notes (Signed)
ED TO INPATIENT HANDOFF REPORT  ED Nurse Name and Phone #: Gearlean Alf Name/Age/Gender Christine Savage 77 y.o. female Room/Bed: ED15A/ED15A  Code Status   Code Status: Full Code  Home/SNF/Other Home Patient oriented to: self, place, time, and situation Is this baseline? Yes   Triage Complete: Triage complete  Chief Complaint NSTEMI (non-ST elevated myocardial infarction) [facility]) [I21.4]  Triage Note Pt sts that she has been having SOB and bilateral lower leg swelling. 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 Pt sts that her urine is brown and her stool orange.    Allergies Allergies  Allergen Reactions   Latex Rash    From Bandaids   Sulfa Antibiotics Nausea Only    Level of Care/Admitting Diagnosis ED Disposition     ED Disposition  Admit   Condition  --   Comment  Hospital Area: [facility] [[id number]]  Level of Care: Stepdown [14]  Covid Evaluation: Asymptomatic - no recent exposure (last 10 days) testing not required  Diagnosis: NSTEMI (non-ST elevated myocardial infarction) [facility]) [[id number]]  Admitting Physician: Lurline Del [[id number]]  Attending Physician: Lurline Del [[id number]]  Certification:: I certify this patient will need inpatient services for at least 2 midnights  Expected Medical Readiness: 09/01/2022          B Medical/Surgery History 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HCC)    GERD (gastroesophageal reflux disease)    Hyperlipidemia    Hypothyroidism    Insomnia    Past Surgical History:  Procedure Laterality Date   ABDOMINAL HYSTERECTOMY     CATARACT EXTRACTION W/ INTRAOCULAR LENS  IMPLANT, BILATERAL     COLONOSCOPY WITH PROPOFOL N/A 12/17/2015   Procedure: COLONOSCOPY WITH PROPOFOL;  Surgeon: Midge Minium, MD;  Location: [facility];  Service: Endoscopy;  Laterality: N/A;   COLONOSCOPY WITH PROPOFOL N/A 02/23/2021   Procedure: COLONOSCOPY WITH  PROPOFOL;  Surgeon: Midge Minium, MD;  Location: [facility] ENDOSCOPY;  Service: Endoscopy;  Laterality: N/A;   COLONOSCOPY WITH PROPOFOL N/A 07/20/2021   Procedure: COLONOSCOPY WITH PROPOFOL;  Surgeon: Midge Minium, MD;  Location: [facility] ENDOSCOPY;  Service: Endoscopy;  Laterality: N/A;   ESOPHAGOGASTRODUODENOSCOPY (EGD) WITH PROPOFOL N/A 12/17/2015   Procedure: ESOPHAGOGASTRODUODENOSCOPY (EGD) WITH PROPOFOL;  Surgeon: Midge Minium, MD;  Location: [facility];  Service: Endoscopy;  Laterality: N/A;  Latex sensitivity   EYE SURGERY     IR IMAGING GUIDED PORT INSERTION  07/15/2022   NASAL SINUS SURGERY     POLYPECTOMY N/A 12/17/2015   Procedure: POLYPECTOMY;  Surgeon: Midge Minium, MD;  Location: [facility];  Service: Endoscopy;  Laterality: N/A;   ROOT CANAL     TUBAL LIGATION       A IV Location/Drains/Wounds Patient Lines/Drains/Airways Status     Active Line/Drains/Airways     Name Placement date Placement time Site Days   Implanted Port 07/15/22 Right Chest 07/15/22  1445  Chest  44   Peripheral IV 08/28/22 20 G Right Antecubital 08/28/22  1612  Antecubital  less than 1            Intake/Output Last 24 hours No intake or output data in the 24 hours ending 08/28/22 1959  Labs/Imaging Results for orders placed or performed during the hospital encounter of 08/28/22 (from the past 48 hour(s))  Basic metabolic panel     Status: Abnormal   Collection Time: 08/28/22  2:24 PM  Result  Value Ref Range   Sodium 135 135 - 145 mmol/L   Potassium 3.8 3.5 - 5.1 mmol/L   Chloride 101 98 - 111 mmol/L   CO2 25 22 - 32 mmol/L   Glucose, Bld 110 (H) 70 - 99 mg/dL    Comment: Glucose reference range applies only to samples taken after fasting for at least 8 hours.   BUN 10 8 - 23 mg/dL   Creatinine, Ser 1.61 0.44 - 1.00 mg/dL   Calcium 8.3 (L) 8.9 - 10.3 mg/dL   GFR, Estimated >09 >60 mL/min    Comment: (NOTE) Calculated using the CKD-EPI Creatinine Equation (2021)    Anion gap  9 5 - 15    Comment: Performed at [facility], [address] [place], [place] [address]  CBC     Status: Abnormal   Collection Time: 08/28/22  2:24 PM  Result Value Ref Range   WBC 10.6 (H) 4.0 - 10.5 K/uL   RBC 5.12 (H) 3.87 - 5.11 MIL/uL   Hemoglobin 15.5 (H) 12.0 - 15.0 g/dL   HCT 81.1 (H) 91.4 - 78.2 %   MCV 92.0 80.0 - 100.0 fL   MCH 30.3 26.0 - 34.0 pg   MCHC 32.9 30.0 - 36.0 g/dL   RDW 95.6 21.3 - 08.6 %   Platelets 300 150 - 400 K/uL   nRBC 0.0 0.0 - 0.2 %    Comment: Performed at [facility], [address] [place], [place] [address]  Troponin I (High Sensitivity)     Status: Abnormal   Collection Time: 08/28/22  2:24 PM  Result Value Ref Range   Troponin I (High Sensitivity) 319 (HH) <18 ng/L    Comment: CRITICAL RESULT CALLED TO, READ BACK BY AND VERIFIED WITH JANE RYAN 08/28/2022 AT 1500 SRR (NOTE) Elevated high sensitivity troponin I (hsTnI) values and significant  changes across serial measurements may suggest ACS but many other  chronic and acute conditions are known to elevate hsTnI results.  Refer to the "Links" section for chest pain algorithms and additional  guidance. Performed at [facility], [address] [place], [place] [address]   Brain natriuretic peptide     Status: None   Collection Time: 08/28/22  2:24 PM  Result Value Ref Range   B Natriuretic Peptide 41.7 0.0 - 100.0 pg/mL    Comment: Performed at [facility], [address] [place], [place] [address]  Lipase, blood     Status: None   Collection Time: 08/28/22  2:24 PM  Result Value Ref Range   Lipase 35 11 - 51 U/L    Comment: Performed at [facility], [address] [place], [place] [address]  Hepatic function panel     Status: Abnormal   Collection Time: 08/28/22  2:24 PM  Result Value Ref Range   Total Protein 5.3 (L) 6.5 - 8.1 g/dL   Albumin 2.8 (L) 3.5 - 5.0 g/dL   AST 027 (H) 15 - 41 U/L   ALT 109 (H) 0 - 44 U/L    Alkaline Phosphatase 160 (H) 38 - 126 U/L   Total Bilirubin 2.7 (H) 0.3 - 1.2 mg/dL   Bilirubin, Direct 0.7 (H) 0.0 - 0.2 mg/dL   Indirect Bilirubin 2.0 (H) 0.3 - 0.9 mg/dL    Comment: Performed at [facility], [address] [place], [place] [address]  Troponin I (High Sensitivity)     Status: Abnormal   Collection Time: 08/28/22  4:13 PM  Result Value Ref Range   Troponin I (High Sensitivity) 333 (HH) <18 ng/L    Comment: CRITICAL VALUE NOTED. VALUE IS CONSISTENT WITH PREVIOUSLY REPORTED/CALLED VALUE SRR (NOTE) Elevated high sensitivity troponin I (hsTnI) values and significant  changes across serial measurements may suggest ACS but many other  chronic and acute conditions are known to elevate hsTnI results.  Refer to the "Links" section for chest pain algorithms and additional  guidance. Performed at [facility], [address], [place], [place] [address]    CT Angio Chest PE W and/or Wo Contrast  Result Date: 08/28/2022 CLINICAL DATA:  Diarrhea and abdominal pain. Shortness of breath. Known metastatic melanoma. EXAM: CT ANGIOGRAPHY CHEST WITH CONTRAST TECHNIQUE: Multidetector CT imaging of the chest was performed using the standard protocol during bolus administration of intravenous contrast. Multiplanar CT image reconstructions and MIPs were obtained to evaluate the vascular anatomy. RADIATION DOSE REDUCTION: This exam was performed according to the departmental dose-optimization program which includes automated exposure control, adjustment of the mA and/or kV according to patient size and/or use of iterative reconstruction technique. CONTRAST:  80mL OMNIPAQUE IOHEXOL 350 MG/ML SOLN COMPARISON:  Chest radiograph performed the same day and PET-CT dated 08/08/2022. FINDINGS: Cardiovascular: Satisfactory opacification of the pulmonary arteries to the segmental level. No evidence of pulmonary embolism. There are coronary artery calcifications. The heart is enlarged. No  pericardial effusion. A right internal jugular central venous port catheter tip terminates at the superior cavoatrial junction. Mediastinum/Nodes: Numerous enlarged mediastinal and hilar lymph nodes appear similar to prior exam. For reference, a precarinal lymph node measures 1.3 cm in short axis (series 4, image 49). No enlarged axillary lymph nodes. Thyroid gland, trachea, and esophagus demonstrate no significant findings. Lungs/Pleura: There is moderate bilateral lower lobe atelectasis, increased compared to 08/08/2022. Multiple bulla are seen in the left upper lobe. No pleural effusion or pneumothorax. Upper Abdomen: The liver has a nodular surface contour, unchanged. Previously described metastatic disease in the liver is not appreciated on this exam. Musculoskeletal: Degenerative changes are seen in the spine. Review of the MIP images confirms the above findings. IMPRESSION: 1. No evidence of pulmonary embolism. 2. Moderate bilateral lower lobe atelectasis, increased compared to 08/08/2022. 3. Numerous enlarged mediastinal and hilar lymph nodes appear similar to prior exam and are consistent with metastatic disease. Electronically Signed   By: Romona Curls M.D.   On: 08/28/2022 17:58   CT ABDOMEN PELVIS W CONTRAST  Result Date: 08/28/2022 CLINICAL DATA:  Acute abdominal pain.  Metastatic melanoma. EXAM: CT ABDOMEN AND PELVIS WITH CONTRAST TECHNIQUE: Multidetector CT imaging of the abdomen and pelvis was performed using the standard protocol following bolus administration of intravenous contrast. RADIATION DOSE REDUCTION: This exam was performed according to the departmental dose-optimization program which includes automated exposure control, adjustment of the mA and/or kV according to patient size and/or use of iterative reconstruction technique. CONTRAST:  80mL OMNIPAQUE IOHEXOL 350 MG/ML SOLN COMPARISON:  PET-CT 08/08/2022 FINDINGS: Lower chest: There is a small amount of atelectasis in the lung bases.  Hepatobiliary: Hypodense hepatic metastatic lesion in the dome measures 2.0 x 2.6 cm and appears grossly unchanged. The liver is diffusely heterogeneous and small additional nodular densities can not be excluded on this study. Progression of metastatic disease is not excluded. Nodular liver contour appears stable. Gallbladder and bile ducts are within normal limits. Pancreas: Unremarkable. No pancreatic ductal dilatation or surrounding inflammatory changes. Spleen: Normal in size without focal abnormality. Adrenals/Urinary Tract: Adrenal glands are unremarkable. Kidneys are normal,  without renal calculi, focal lesion, or hydronephrosis. Bladder is unremarkable. Stomach/Bowel: There is mild wall thickening of the cecum with trace surrounding inflammatory stranding. There is no bowel obstruction, pneumatosis or free air. The appendix is within normal limits. Small bowel and stomach are within normal limits. Duodenal diverticulum is present. Vascular/Lymphatic: Aortic atherosclerosis. No enlarged abdominal or pelvic lymph nodes. Reproductive: Status post hysterectomy. No adnexal masses. Other: There is a small amount of free fluid in the lower abdomen and pelvis. Musculoskeletal: No acute or significant osseous findings. IMPRESSION: 1. Mild wall thickening of the cecum with trace surrounding inflammatory stranding worrisome for infectious or inflammatory colitis. 2. Small amount of free fluid in the lower abdomen and pelvis. 3. Dominant hepatic lesion appears grossly unchanged. The liver is diffusely heterogeneous and additional small nodular densities can not be excluded on this study. Progression of metastatic disease is not excluded. Consider further evaluation with MRI. 4. Aortic atherosclerosis. Aortic Atherosclerosis (ICD10-I70.0). Electronically Signed   By: Darliss Cheney M.D.   On: 08/28/2022 17:48   DG Chest 2 View  Result Date: 08/28/2022 CLINICAL DATA:  Shortness of breath. EXAM: CHEST - 2 VIEW  COMPARISON:  10/08/2010, PET-CT 08/08/2022 FINDINGS: Right IJ Port-A-Cath has tip over the SVC. Lungs are hypoinflated with minimal bibasilar linear density left worse than right likely atelectasis as seen on recent PET-CT. No significant effusion. Cardiomediastinal silhouette and remainder the exam is unchanged. IMPRESSION: Hypoinflation with minimal bibasilar linear density left worse than right likely atelectasis. Electronically Signed   By: Elberta Fortis M.D.   On: 08/28/2022 15:01    Pending Labs Unresulted Labs (From admission, onward)     Start     Ordered   08/29/22 0500  CBC  Daily,   R      08/28/22 1925   08/29/22 0500  Lipoprotein A (LPA)  Tomorrow morning,   R        08/28/22 1928   08/29/22 0500  Lipid panel  Tomorrow morning,   R        08/28/22 1928   08/29/22 0200  Heparin level (unfractionated)  Once-Timed,   TIMED        08/28/22 1925   08/28/22 1928  C Difficile Quick Screen w PCR reflex  (C Difficile quick screen w PCR reflex panel )  Once, for 24 hours,   TIMED       References:    CDiff Information Tool   08/28/22 1928   08/28/22 1928  Gastrointestinal Panel by PCR , Stool  (Gastrointestinal Panel by PCR, Stool                                                                                                                                                     **Does Not include CLOSTRIDIUM DIFFICILE testing. **If CDIFF testing is needed, place order  from the "C Difficile Testing" order set.**)  Once,   R        08/28/22 1928   08/28/22 1926  Hemoglobin A1c  Once,   R        08/28/22 1928   08/28/22 1926  TSH  Once,   R        08/28/22 1928   08/28/22 1922  APTT  ONCE - STAT,   STAT        08/28/22 1921   08/28/22 1922  Protime-INR  ONCE - STAT,   STAT        08/28/22 1921   Unscheduled  Occult blood card to lab, stool  As needed,   TIMED      08/28/22 1951            Vitals/Pain Today's Vitals   08/28/22 1545 08/28/22 1600 08/28/22 1929 08/28/22 1930  BP:  (!) 149/88 (!) 145/82 129/66 121/64  Pulse:  89 96 96  Resp: 15 (!) 22 20 (!) 23  Temp:   98 F (36.7 C)   TempSrc:   Oral   SpO2: 96% 93% 94% 95%  Weight:      Height:      PainSc:   0-No pain     Isolation Precautions Enteric precautions (UV disinfection)  Medications Medications  heparin ADULT infusion 100 units/mL (25000 units/216mL) (900 Units/hr Intravenous New Bag/Given 08/28/22 1947)  aspirin EC tablet 81 mg (has no administration in time range)  nitroGLYCERIN (NITROSTAT) SL tablet 0.4 mg (has no administration in time range)  acetaminophen (TYLENOL) tablet 650 mg (has no administration in time range)  ondansetron (ZOFRAN) injection 4 mg (has no administration in time range)  0.9 %  sodium chloride infusion ( Intravenous New Bag/Given 08/28/22 1953)  metoprolol tartrate (LOPRESSOR) tablet 12.5 mg (has no administration in time range)  cefTRIAXone (ROCEPHIN) 1 g in sodium chloride 0.9 % 100 mL IVPB (has no administration in time range)  metroNIDAZOLE (FLAGYL) IVPB 500 mg (has no administration in time range)  pantoprazole (PROTONIX) injection 40 mg (has no administration in time range)  iohexol (OMNIPAQUE) 350 MG/ML injection 80 mL (80 mLs Intravenous Contrast Given 08/28/22 1648)  piperacillin-tazobactam (ZOSYN) IVPB 3.375 g (3.375 g Intravenous New Bag/Given 08/28/22 1927)  heparin bolus via infusion 4,000 Units (4,000 Units Intravenous Bolus from Bag 08/28/22 1952)  aspirin chewable tablet 324 mg (324 mg Oral Given 08/28/22 1943)    Or  aspirin suppository 300 mg ( Rectal See Alternative 08/28/22 1943)    Mobility walks     Focused Assessments Pt with 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  No pain at this time.    R Recommendations: See Admitting Provider Note  Report given to:   Additional Notes:

## 2022-08-28 NOTE — ED Notes (Signed)
Helped pt to the bathroom to void 

## 2022-08-28 NOTE — ED Notes (Signed)
Pt reports increased difficulty swallowing, she does seem to have some difficulty with coughing after she takes her po med.

## 2022-08-28 NOTE — ED Provider Notes (Signed)
Presents for evaluation of 2 weeks of loose diarrhea, shortness of breath at rest, assisting lower abdominal pain and bilateral lower extremity leg and ankle swelling, right worse than left beginning 2 weeks ago after chemotherapy infusion.  Symptoms progressively worsening.  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  Poor food and fluid intake.  Accompanied by family.  Completed endoscopy on August 24, 2022 for dysphagia, negative, endorses that some of the symptoms were present during that time.  Based on symptoms patient being sent to the nearest emergency department for full evaluation and workup as lab results will not be available for 3 to 4 days and unable to complete imaging.  To be escorted by family   Valinda Hoar, NP 08/28/22 1352

## 2022-08-28 NOTE — ED Notes (Signed)
Patient transported to CT 

## 2022-08-28 NOTE — Progress Notes (Signed)
ANTICOAGULATION CONSULT NOTE - Initial Consult  Pharmacy Consult for Iv heparin Indication: chest pain/ACS  Allergies  Allergen Reactions   Latex Rash    From Bandaids   Sulfa Antibiotics Nausea Only    Patient Measurements: Height: 5\' 5"  (165.1 cm) Weight: 76.7 kg (169 lb) IBW/kg (Calculated) : 57 Heparin Dosing Weight: 72.9 kg  Vital Signs: Temp: 98.7 F (37.1 C) (08/18 1421) Temp Source: Oral (08/18 1421) BP: 145/82 (08/18 1600) Pulse Rate: 89 (08/18 1600)  Labs: Recent Labs    08/28/22 1424 08/28/22 1613  HGB 15.5*  --   HCT 47.1*  --   PLT 300  --   CREATININE 0.73  --   TROPONINIHS 319* 333*    Estimated Creatinine Clearance: 60.3 mL/min (by C-G formula based on SCr of 0.73 mg/dL).   Medical History: 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HCC)    GERD (gastroesophageal reflux disease)    Hyperlipidemia    Hypothyroidism    Insomnia     Medications:  No prior to admission anticoagulation  Assessment: 77 year old female being admitted with ACS. Pharmacy has been consulted to manage IV heparin  H&H stable. APTT and PT-INR to be collected -- spoke with RN. RN to draw labs prior to starting heparin   Goal of Therapy:  Heparin level 0.3-0.7 units/ml Monitor platelets by anticoagulation protocol: Yes   Plan:  Give 4000 units bolus x 1 Start heparin infusion at 900 units/hr Check anti-Xa level in 8 hours and daily while on heparin Continue to monitor H&H and platelets   Elliot Gurney, PharmD, BCPS Clinical Pharmacist  08/28/2022 7:19 PM

## 2022-08-28 NOTE — ED Notes (Signed)
Patient is being discharged from the Urgent Care and sent to the Emergency Department via [facility] . Per provider Dola Argyle., patient is in need of higher level of care due to abdominal pain. Patient is aware and verbalizes understanding of plan of care.  Vitals:   08/28/22 1331  BP: 119/82  Pulse: (!) 104  Resp: 16  Temp: 98 F (36.7 C)  SpO2: 94%

## 2022-08-28 NOTE — ED Triage Notes (Signed)
Pt presents with diarrhea with abdominal pain that started 2 weeks ago and SOB. Pt does have cancer and is currently doing infusion therapy. Pt states she is having some swelling on her feet that started Friday.

## 2022-08-28 NOTE — ED Triage Notes (Signed)
Pt sts that she has been having SOB and bilateral lower leg swelling. 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 Pt sts that her urine is brown and her stool orange.

## 2022-08-28 NOTE — H&P (Signed)
History and Physical    Christine Savage:[id number] DOB: 09/26/1945 DOA: 08/28/2022  PCP: Marina Goodell, MD  Patient coming from: home  I have personally briefly reviewed patient's old medical records in [facility]  Chief Complaint: SOB/abdominal pain Christine Savage  HPI: Christine Savage is a 77 y.o. 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 Patient presents to Ed with complaint of progressive sob, abdominal pain and diarrhea x 2 weeks with increase  swelling in lower extremity x 2-3days. Patient states symptoms started after he last immunotherapy infusion. She notes no associated fever/chills/ cough but does not nausea,weakness and lower extremity swelling left greater than right. She also noted orange stools.    ED Course:  Afeb, bp 119/82, HR104, rr 16, sat 94% on ra  Wbc 10.6, hgb 15.5,plt 300 BNP 41.7 Na 135, K 3.8, cl 101, cr 0.73 Trop 319, 333 Lipase35 Ast 164,alkphos 160 EKG: snr at 102, LAD,  new twave changes ant/lat Cxr IMPRESSION: Hypoinflation with minimal bibasilar linear density left worse than right likely atelectasis.  Ctabd/pelvis IMPRESSION: 1. Mild wall thickening of the cecum with trace surrounding inflammatory stranding worrisome for infectious or inflammatory colitis. 2. Small amount of free fluid in the lower abdomen and pelvis. 3. Dominant hepatic lesion appears grossly unchanged. The liver is diffusely heterogeneous and additional small nodular densities can not be excluded on this study. Progression of metastatic disease is not excluded. Consider further evaluation with MRI. 4. Aortic atherosclerosis.   CTPE 1. No evidence of pulmonary embolism. 2. Moderate bilateral lower lobe atelectasis, increased compared to 08/08/2022. 3. Numerous enlarged mediastinal and hilar lymph nodes appear similar to  prior exam and are consistent with metastatic disease.    Review of Systems: As per HPI otherwise 10 point review of systems negative.   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HCC)    GERD (gastroesophageal reflux disease)    Hyperlipidemia    Hypothyroidism    Insomnia     Past Surgical History:  Procedure Laterality Date   ABDOMINAL HYSTERECTOMY     CATARACT EXTRACTION W/ INTRAOCULAR LENS  IMPLANT, BILATERAL     COLONOSCOPY WITH PROPOFOL N/A 12/17/2015   Procedure: COLONOSCOPY WITH PROPOFOL;  Surgeon: Midge Minium, MD;  Location: [facility];  Service: Endoscopy;  Laterality: N/A;   COLONOSCOPY WITH PROPOFOL N/A 02/23/2021   Procedure: COLONOSCOPY WITH PROPOFOL;  Surgeon: Midge Minium, MD;  Location: [facility];  Service: Endoscopy;  Laterality: N/A;   COLONOSCOPY WITH PROPOFOL N/A 07/20/2021   Procedure: COLONOSCOPY WITH PROPOFOL;  Surgeon: Midge Minium, MD;  Location: [facility] ENDOSCOPY;  Service: Endoscopy;  Laterality: N/A;   ESOPHAGOGASTRODUODENOSCOPY (EGD) WITH PROPOFOL N/A 12/17/2015   Procedure: ESOPHAGOGASTRODUODENOSCOPY (EGD) WITH PROPOFOL;  Surgeon: Midge Minium, MD;  Location: [facility];  Service: Endoscopy;  Laterality: N/A;  Latex sensitivity   EYE SURGERY     IR IMAGING GUIDED PORT INSERTION  07/15/2022   NASAL SINUS SURGERY     POLYPECTOMY N/A 12/17/2015   Procedure: POLYPECTOMY;  Surgeon: Midge Minium, MD;  Location: [facility];  Service: Endoscopy;  Laterality: N/A;   ROOT CANAL     TUBAL LIGATION       reports that she has never smoked. She has never used smokeless tobacco. She reports that she does not drink alcohol and does not use drugs.  Allergies  Allergen Reactions   Latex Rash    From Bandaids   Sulfa Antibiotics Nausea Only    Family History  Problem Relation Age of Onset   Coronary artery disease Mother    Cancer Father    Coronary artery disease Father    Osteoarthritis Brother    Cancer  Brother    Prior to Admission medications   Medication Sig Start Date End Date Taking? Authorizing Provider  acetaminophen (TYLENOL) 650 MG CR tablet Take 1,300 mg by mouth.    [provider]  Calcium Carbonate-Vitamin D 600-400 MG-UNIT tablet Take by mouth.    [provider]  Cyanocobalamin 1000 MCG TBCR Take by mouth.    [provider]  diphenhydrAMINE (BENADRYL) 25 mg capsule Take by mouth.    [provider]  docusate sodium (COLACE) 100 MG capsule Take 100 mg by mouth.    [provider]  ibuprofen (ADVIL) 600 MG tablet Take 600 mg by mouth 3 (three) times daily. 02/01/22   [provider]  levothyroxine (SYNTHROID, LEVOTHROID) 50 MCG tablet TAKE 1 TABLET (50 MCG TOTAL) BY MOUTH ONCE DAILY. 06/29/15   [provider]  lidocaine-prilocaine (EMLA) cream Apply to affected area once 06/14/22   Creig Hines, MD  Omega-3 Fatty Acids (FISH OIL PO) Take by mouth.    [provider]  ondansetron (ZOFRAN) 8 MG tablet Take 1 tablet (8 mg total) by mouth every 8 (eight) hours as needed for nausea or vomiting. 06/14/22   Creig Hines, MD  pantoprazole (PROTONIX) 40 MG tablet Take 40 mg by mouth 2 (two) times daily. 12/20/20   [provider]  pravastatin (PRAVACHOL) 20 MG tablet Take by mouth. 06/02/15 08/10/22  [provider]  prochlorperazine (COMPAZINE) 10 MG tablet Take 1 tablet (10 mg total) by mouth every 6 (six) hours as needed for nausea or vomiting. 06/14/22   Creig Hines, MD  sodium chloride (OCEAN) 0.65 % nasal spray Place into the nose.    [provider]    Physical Exam: Vitals:   08/28/22 1421 08/28/22 1422 08/28/22 1545 08/28/22 1600  BP: 122/87  (!) 149/88 (!) 145/82  Pulse: (!) 108   89  Resp: 18  15 (!) 22  Temp: 98.7 F (37.1 C)     TempSrc: Oral     SpO2: 94%  96% 93%  Weight:  76.7 kg    Height:  5\' 5"  (1.651 m)      Constitutional: NAD, calm, comfortable appears  fatigued and weak Vitals:   08/28/22 1421 08/28/22 1422 08/28/22 1545 08/28/22 1600  BP: 122/87  (!) 149/88 (!) 145/82  Pulse: (!) 108   89  Resp: 18  15 (!) 22  Temp: 98.7 F (37.1 C)     TempSrc: Oral     SpO2: 94%  96% 93%  Weight:  76.7 kg    Height:  5\' 5"  (1.651 m)     Eyes: PERRL, lids and conjunctivae normal ENMT: Mucous membranes are moist. Posterior pharynx clear of any exudate or lesions.Normal dentition.  Neck: normal, supple, no masses, no thyromegaly Respiratory: clear to auscultation bilaterally, no wheezing, no crackles. Normal respiratory effort. No accessory muscle use.  Cardiovascular: Regular rate and rhythm, no murmurs / rubs / gallops. + extremity edema. 2+ pedal pulses.  Abdomen: lower quads tenderness, no masses palpated. No hepatosplenomegaly. Bowel sounds positive.  Musculoskeletal: no clubbing / cyanosis. No joint deformity upper and lower extremities. Good ROM, no  contractures. Normal muscle tone.  Skin: no rashes, lesions, ulcers. No induration Neurologic: CN 2-12 grossly intact. Sensation intact,. Strength 5/5 in all 4.  Psychiatric: Normal judgment and insight. Alert and oriented x 3. Normal mood.    Labs on Admission: I have personally reviewed following labs and imaging studies  CBC: Recent Labs  Lab 08/28/22 1424  WBC 10.6*  HGB 15.5*  HCT 47.1*  MCV 92.0  PLT 300   Basic Metabolic Panel: Recent Labs  Lab 08/28/22 1424  NA 135  K 3.8  CL 101  CO2 25  GLUCOSE 110*  BUN 10  CREATININE 0.73  CALCIUM 8.3*   GFR: Estimated Creatinine Clearance: 60.3 mL/min (by C-G formula based on SCr of 0.73 mg/dL). Liver Function Tests: Recent Labs  Lab 08/28/22 1424  AST 164*  ALT 109*  ALKPHOS 160*  BILITOT 2.7*  PROT 5.3*  ALBUMIN 2.8*   Recent Labs  Lab 08/28/22 1424  LIPASE 35   No results for input(s): "AMMONIA" in the last 168 hours. Coagulation Profile: No results for input(s): "INR", "PROTIME" in the last 168 hours. Cardiac  Enzymes: No results for input(s): "CKTOTAL", "CKMB", "CKMBINDEX", "TROPONINI" in the last 168 hours. BNP (last 3 results) No results for input(s): "PROBNP" in the last 8760 hours. HbA1C: No results for input(s): "HGBA1C" in the last 72 hours. CBG: No results for input(s): "GLUCAP" in the last 168 hours. Lipid Profile: No results for input(s): "CHOL", "HDL", "LDLCALC", "TRIG", "CHOLHDL", "LDLDIRECT" in the last 72 hours. Thyroid Function Tests: No results for input(s): "TSH", "T4TOTAL", "FREET4", "T3FREE", "THYROIDAB" in the last 72 hours. Anemia Panel: No results for input(s): "VITAMINB12", "FOLATE", "FERRITIN", "TIBC", "IRON", "RETICCTPCT" in the last 72 hours. Urine analysis: No results found for: "COLORURINE", "APPEARANCEUR", "LABSPEC", "PHURINE", "GLUCOSEU", "HGBUR", "BILIRUBINUR", "KETONESUR", "PROTEINUR", "UROBILINOGEN", "NITRITE", "LEUKOCYTESUR"  Radiological Exams on Admission: CT Angio Chest PE W and/or Wo Contrast  Result Date: 08/28/2022 CLINICAL DATA:  Diarrhea and abdominal pain. Shortness of breath. Known metastatic melanoma. EXAM: CT ANGIOGRAPHY CHEST WITH CONTRAST TECHNIQUE: Multidetector CT imaging of the chest was performed using the standard protocol during bolus administration of intravenous contrast. Multiplanar CT image reconstructions and MIPs were obtained to evaluate the vascular anatomy. RADIATION DOSE REDUCTION: This exam was performed according to the departmental dose-optimization program which includes automated exposure control, adjustment of the mA and/or kV according to patient size and/or use of iterative reconstruction technique. CONTRAST:  80mL OMNIPAQUE IOHEXOL 350 MG/ML SOLN COMPARISON:  Chest radiograph performed the same day and PET-CT dated 08/08/2022. FINDINGS: Cardiovascular: Satisfactory opacification of the pulmonary arteries to the segmental level. No evidence of pulmonary embolism. There are coronary artery calcifications. The heart is enlarged. No  pericardial effusion. A right internal jugular central venous port catheter tip terminates at the superior cavoatrial junction. Mediastinum/Nodes: Numerous enlarged mediastinal and hilar lymph nodes appear similar to prior exam. For reference, a precarinal lymph node measures 1.3 cm in short axis (series 4, image 49). No enlarged axillary lymph nodes. Thyroid gland, trachea, and esophagus demonstrate no significant findings. Lungs/Pleura: There is moderate bilateral lower lobe atelectasis, increased compared to 08/08/2022. Multiple bulla are seen in the left upper lobe. No pleural effusion or pneumothorax. Upper Abdomen: The liver has a nodular surface contour, unchanged. Previously described metastatic disease in the liver is not appreciated on this exam. Musculoskeletal: Degenerative changes are seen in the spine. Review of the MIP images confirms the above findings. IMPRESSION: 1. No evidence of pulmonary embolism. 2. Moderate bilateral lower lobe atelectasis,  increased compared to 08/08/2022. 3. Numerous enlarged mediastinal and hilar lymph nodes appear similar to prior exam and are consistent with metastatic disease. Electronically Signed   By: Romona Curls M.D.   On: 08/28/2022 17:58   CT ABDOMEN PELVIS W CONTRAST  Result Date: 08/28/2022 CLINICAL DATA:  Acute abdominal pain.  Metastatic melanoma. EXAM: CT ABDOMEN AND PELVIS WITH CONTRAST TECHNIQUE: Multidetector CT imaging of the abdomen and pelvis was performed using the standard protocol following bolus administration of intravenous contrast. RADIATION DOSE REDUCTION: This exam was performed according to the departmental dose-optimization program which includes automated exposure control, adjustment of the mA and/or kV according to patient size and/or use of iterative reconstruction technique. CONTRAST:  80mL OMNIPAQUE IOHEXOL 350 MG/ML SOLN COMPARISON:  PET-CT 08/08/2022 FINDINGS: Lower chest: There is a small amount of atelectasis in the lung bases.  Hepatobiliary: Hypodense hepatic metastatic lesion in the dome measures 2.0 x 2.6 cm and appears grossly unchanged. The liver is diffusely heterogeneous and small additional nodular densities can not be excluded on this study. Progression of metastatic disease is not excluded. Nodular liver contour appears stable. Gallbladder and bile ducts are within normal limits. Pancreas: Unremarkable. No pancreatic ductal dilatation or surrounding inflammatory changes. Spleen: Normal in size without focal abnormality. Adrenals/Urinary Tract: Adrenal glands are unremarkable. Kidneys are normal, without renal calculi, focal lesion, or hydronephrosis. Bladder is unremarkable. Stomach/Bowel: There is mild wall thickening of the cecum with trace surrounding inflammatory stranding. There is no bowel obstruction, pneumatosis or free air. The appendix is within normal limits. Small bowel and stomach are within normal limits. Duodenal diverticulum is present. Vascular/Lymphatic: Aortic atherosclerosis. No enlarged abdominal or pelvic lymph nodes. Reproductive: Status post hysterectomy. No adnexal masses. Other: There is a small amount of free fluid in the lower abdomen and pelvis. Musculoskeletal: No acute or significant osseous findings. IMPRESSION: 1. Mild wall thickening of the cecum with trace surrounding inflammatory stranding worrisome for infectious or inflammatory colitis. 2. Small amount of free fluid in the lower abdomen and pelvis. 3. Dominant hepatic lesion appears grossly unchanged. The liver is diffusely heterogeneous and additional small nodular densities can not be excluded on this study. Progression of metastatic disease is not excluded. Consider further evaluation with MRI. 4. Aortic atherosclerosis. Aortic Atherosclerosis (ICD10-I70.0). Electronically Signed   By: Darliss Cheney M.D.   On: 08/28/2022 17:48   DG Chest 2 View  Result Date: 08/28/2022 CLINICAL DATA:  Shortness of breath. EXAM: CHEST - 2 VIEW  COMPARISON:  10/08/2010, PET-CT 08/08/2022 FINDINGS: Right IJ Port-A-Cath has tip over the SVC. Lungs are hypoinflated with minimal bibasilar linear density left worse than right likely atelectasis as seen on recent PET-CT. No significant effusion. Cardiomediastinal silhouette and remainder the exam is unchanged. IMPRESSION: Hypoinflation with minimal bibasilar linear density left worse than right likely atelectasis. Electronically Signed   By: Elberta Fortis M.D.   On: 08/28/2022 15:01    EKG: Independently reviewed. See above   Assessment/Plan  NSTEMI, presumed type 1 -admit to step down  - continue with heparin drip  -asa,bb, statin  -check lipid panel  -echo in am  -cardiology consult Dr Mayford Knife   Colitis  -unclear cause  -gi panel pending  - no recent abx  - start ctx/metronidazole  - supportive care    Elevated Liver enzymes in setting of know liver nodule with new nodules noted -? Met disease -will need to have MRI For further evaluation  -will defer to heme /onc   Ocular melanoma  -on  infusion therapy  -now transitioning [facility] for chemo therapy   GERD -ppi   Hypothyroidism  -resume supplement  Hyperlipidemia -hold statin due to elevated lfts    DVT prophylaxis: heparin drip Code Status: full/ as discussed per patient wishes in event of cardiac arrest  Family Communication:  Noomi, Scala (Son) [phone] (Mobile)  Disposition Plan: patient  expected to be admitted greater than 2 midnights  Consults called: Cardiology  Admission status: step down    Lurline Del MD [facility]   If 7PM-7AM, please contact night-coverage [email or web address] Password [facility]  08/28/2022, 7:02 PM

## 2022-08-29 ENCOUNTER — Inpatient Hospital Stay (HOSPITAL_COMMUNITY)
Admit: 2022-08-29 | Discharge: 2022-08-29 | Disposition: A | Discharge status: Home / Self Care | Payer: PPO | Attending: Internal Medicine | Admitting: Internal Medicine

## 2022-08-29 ENCOUNTER — Other Ambulatory Visit: Payer: Self-pay

## 2022-08-29 ENCOUNTER — Encounter: Payer: Self-pay | Admitting: Internal Medicine

## 2022-08-29 DIAGNOSIS — K529 Noninfective gastroenteritis and colitis, unspecified: Secondary | ICD-10-CM | POA: Diagnosis not present

## 2022-08-29 DIAGNOSIS — I214 Non-ST elevation (NSTEMI) myocardial infarction: Secondary | ICD-10-CM | POA: Diagnosis not present

## 2022-08-29 DIAGNOSIS — R7989 Other specified abnormal findings of blood chemistry: Secondary | ICD-10-CM | POA: Diagnosis not present

## 2022-08-29 LAB — ECHOCARDIOGRAM COMPLETE
AR max vel: 2.55 cm2
AV Area VTI: 2.87 cm2
AV Area mean vel: 2.73 cm2
AV Mean grad: 4 mmHg
AV Peak grad: 11 mmHg
Ao pk vel: 1.66 m/s
Area-P 1/2: 3.26 cm2
Calc EF: 61.4 %
Height: 65 in
MV VTI: 2.99 cm2
S' Lateral: 3.2 cm
Single Plane A2C EF: 66.5 %
Single Plane A4C EF: 51.4 %
Weight: 2704 [oz_av]

## 2022-08-29 LAB — CBC
HCT: 42.1 % (ref 36.0–46.0)
Hemoglobin: 13.7 g/dL (ref 12.0–15.0)
MCH: 30.4 pg (ref 26.0–34.0)
MCHC: 32.5 g/dL (ref 30.0–36.0)
MCV: 93.3 fL (ref 80.0–100.0)
Platelets: 254 10*3/uL (ref 150–400)
RBC: 4.51 MIL/uL (ref 3.87–5.11)
RDW: 15.5 % (ref 11.5–15.5)
WBC: 8.9 10*3/uL (ref 4.0–10.5)
nRBC: 0 % (ref 0.0–0.2)

## 2022-08-29 LAB — HEPARIN LEVEL (UNFRACTIONATED)
Heparin Unfractionated: 0.2 [IU]/mL — ABNORMAL LOW (ref 0.30–0.70)
Heparin Unfractionated: 0.1 [IU]/mL — ABNORMAL LOW (ref 0.30–0.70)
Heparin Unfractionated: 0.63 [IU]/mL (ref 0.30–0.70)

## 2022-08-29 LAB — LIPID PANEL
Cholesterol: 121 mg/dL (ref 0–200)
HDL: 26 mg/dL — ABNORMAL LOW (ref >40)
LDL Cholesterol: 83 mg/dL (ref 0–99)
Total CHOL/HDL Ratio: 4.7 ratio
Triglycerides: 58 mg/dL (ref <150)
VLDL: 12 mg/dL (ref 0–40)

## 2022-08-29 LAB — HEMOGLOBIN A1C
Hgb A1c MFr Bld: 5.7 % — ABNORMAL HIGH (ref 4.8–5.6)
Mean Plasma Glucose: 116.89 mg/dL

## 2022-08-29 LAB — MRSA NEXT GEN BY PCR, NASAL: MRSA by PCR Next Gen: NOT DETECTED

## 2022-08-29 LAB — PROCALCITONIN: Procalcitonin: <0.1 ng/mL

## 2022-08-29 LAB — C-REACTIVE PROTEIN: CRP: 1.5 mg/dL — ABNORMAL HIGH (ref <1.0)

## 2022-08-29 LAB — GLUCOSE, CAPILLARY: Glucose-Capillary: 86 mg/dL (ref 70–99)

## 2022-08-29 MED ORDER — HEPARIN BOLUS VIA INFUSION
2200.0000 [IU] | Freq: Once | INTRAVENOUS | Status: AC
  Administered 2022-08-29: 2200 [IU] via INTRAVENOUS
  Filled 2022-08-29: qty 2200

## 2022-08-29 MED ORDER — HEPARIN BOLUS VIA INFUSION
1100.0000 [IU] | Freq: Once | INTRAVENOUS | Status: AC
  Administered 2022-08-29: 1100 [IU] via INTRAVENOUS
  Filled 2022-08-29: qty 1100

## 2022-08-29 MED ORDER — CHLORHEXIDINE GLUCONATE CLOTH 2 % EX PADS
6.0000 | MEDICATED_PAD | Freq: Every day | CUTANEOUS | Status: DC
  Administered 2022-08-29: 6 via TOPICAL

## 2022-08-29 MED ORDER — LEVOTHYROXINE SODIUM 50 MCG PO TABS
50.0000 ug | ORAL_TABLET | Freq: Every day | ORAL | Status: DC
  Administered 2022-08-31 – 2022-09-01 (×2): 50 ug via ORAL
  Filled 2022-08-29 (×2): qty 1

## 2022-08-29 MED ORDER — METOPROLOL TARTRATE 5 MG/5ML IV SOLN
2.5000 mg | Freq: Once | INTRAVENOUS | Status: AC
  Administered 2022-08-29: 2.5 mg via INTRAVENOUS
  Filled 2022-08-29: qty 5

## 2022-08-29 NOTE — Progress Notes (Signed)
PROGRESS NOTE  Littie Deeds    DOB: 24-Jul-1945, 77 y.o.  QIO:[id number]    Code Status: Full Code   DOA: 08/28/2022   LOS: 1   Brief hospital course  RHIAN MCCOMMON is a 77 y.o. 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  They presented from home to the ED on 08/28/2022 with SOB, abdominal pain, diarrhea x 2 weeks. Also has had progressively worsened swelling of LEs.   In the ED, it was found that they had Afeb, bp 119/82, HR104, rr 16, sat 94% on ra.  Significant findings included Wbc 10.6, hgb 15.5,plt 300. BNP 41.7 Na 135, K 3.8, cl 101, cr 0.73. Trop 319, 333. Lipase 35. Ast 164,alkphos 160 EKG: sinus tachycardia 102 Cxr- Hypoinflation with minimal bibasilar linear density left worse than right likely atelectasis.   Ctabd/pelvis 1. Mild wall thickening of the cecum with trace surrounding inflammatory stranding worrisome for infectious or inflammatory colitis. 2. Small amount of free fluid in the lower abdomen and pelvis. 3. Dominant hepatic lesion appears grossly unchanged. The liver is diffusely heterogeneous and additional small nodular densities can not be excluded on this study. Progression of metastatic disease is not excluded. Consider further evaluation with MRI   CTPE 1. No evidence of pulmonary embolism. 2. Moderate bilateral lower lobe atelectasis, increased compared to 08/08/2022. 3. Numerous enlarged mediastinal and hilar lymph nodes appear similar to prior exam and are consistent with metastatic disease.  They were initially treated with heparin gtt. Cardiology was consulted.   Patient was admitted to medicine service for further workup and management of NSTEMI, colitis as outlined in detail below.  08/29/22 -stable  Assessment & Plan  Principal Problem:   NSTEMI (non-ST elevated myocardial infarction) (HCC)  NSTEMI- currently no chest pain or SOB at rest. - continue heparin  gtt - cardiology following - f/u echo -asa,bb, statin    Colitis- infectious vs autoimmune. Endorses abdominal tenderness and loose stool this morning.  - consult GI to evaluate for autoimmune colitis -unclear cause  -gi and cdiff testing pending  - start ctx/metronidazole  - supportive care    Elevated Liver enzymes in setting of know liver nodule with new nodules noted - consulted heme /onc. Dr. Smith Robert will see tomorrow - palliative consult per oncology recommendations. Patient did not respond to 1st line therapy and is wanting to pursue 2nd line so pending initiating care at [facility] [address]   Ocular melanoma  -on infusion therapy  -now [facility]ing [facility] for [facility] therapy    GERD -ppi    Hypothyroidism  -resume supplement   Hyperlipidemia -hold statin due to elevated lfts  Body mass index is 28.12 kg/m.  VTE ppx:   Diet:     Diet   Diet heart healthy/carb modified Room service appropriate? Yes; Fluid consistency: Thin   Consultants: Cardiology Oncology Palliative   Subjective 08/29/22    Pt reports feeling improved since admission. Denies SOB or chest pain currently. Had a loose BM today   Objective   Vitals:   08/29/22 0529 08/29/22 0600 08/29/22 0700 08/29/22 0750  BP:  (!) 141/76 126/66 122/71  Pulse:  86 82 88  Resp:  (!) 27 20 (!) 26  Temp: 97.8 F (36.6 C)   97.8 F (36.6 C)  TempSrc: Oral   Oral  SpO2:  96% 97% 97%  Weight:    76.7 kg  Height:    5\' 5"  (1.651 m)    Intake/Output  Summary (Last 24 hours) at 08/29/2022 0828 Last data filed at 08/29/2022 0728 Gross per 24 hour  Intake 151.53 ml  Output --  Net 151.53 ml   Filed Weights   08/28/22 1422 08/29/22 0750  Weight: 76.7 kg 76.7 kg     Physical Exam:  General: awake, alert, NAD HEENT: atraumatic, clear conjunctiva, anicteric sclera, MMM, hearing grossly normal Respiratory: normal respiratory effort. CTAB Cardiovascular: quick capillary refill, normal S1/S2, RRR, no JVD. Systolic  murmurs Gastrointestinal: soft, diffuse tenderness. No rebound, guarding, masses Nervous: A&O x3. no gross focal neurologic deficits, normal speech Extremities: moves all equally, no edema, normal tone Skin: dry, intact, normal temperature, normal color. No rashes, lesions or ulcers on exposed skin Psychiatry: normal mood, congruent affect  Labs   I have personally reviewed the following labs and imaging studies CBC    Component Value Date/Time   WBC 8.9 08/29/2022 0530   RBC 4.51 08/29/2022 0530   HGB 13.7 08/29/2022 0530   HGB 14.3 08/10/2022 0907   HCT 42.1 08/29/2022 0530   PLT 254 08/29/2022 0530   PLT 240 08/10/2022 0907   MCV 93.3 08/29/2022 0530   MCH 30.4 08/29/2022 0530   MCHC 32.5 08/29/2022 0530   RDW 15.5 08/29/2022 0530   LYMPHSABS 1.2 08/10/2022 0907   MONOABS 1.0 08/10/2022 0907   EOSABS 0.3 08/10/2022 0907   BASOSABS 0.1 08/10/2022 0907      Latest Ref Rng & Units 08/28/2022    2:24 PM 08/10/2022    9:07 AM 07/20/2022    9:29 AM  BMP  Glucose 70 - 99 mg/dL 657  846  962   BUN 8 - 23 mg/dL 10  13  9    Creatinine 0.44 - 1.00 mg/dL 9.52  8.41  3.24   Sodium 135 - 145 mmol/L 135  133  137   Potassium 3.5 - 5.1 mmol/L 3.8  3.8  3.5   Chloride 98 - 111 mmol/L 101  102  103   CO2 22 - 32 mmol/L 25  27  26    Calcium 8.9 - 10.3 mg/dL 8.3  8.2  8.5     US Venous Img Lower Unilateral Left (DVT)  Result Date: 08/28/2022 CLINICAL DATA:  Pain, swelling of left lower extremity EXAM: LEFT LOWER EXTREMITY VENOUS DOPPLER ULTRASOUND TECHNIQUE: Gray-scale sonography with compression, as well as color and duplex ultrasound, were performed to evaluate the deep venous system(s) from the level of the common femoral vein through the popliteal and proximal calf veins. COMPARISON:  None Available. FINDINGS: VENOUS Normal compressibility of the common femoral, superficial femoral, and popliteal veins, as well as the visualized calf veins. Visualized portions of profunda femoral vein  and great saphenous vein unremarkable. No filling defects to suggest DVT on grayscale or color Doppler imaging. Doppler waveforms show normal direction of venous flow, normal respiratory plasticity and response to augmentation. Limited views of the contralateral common femoral vein are unremarkable. OTHER None. Limitations: none IMPRESSION: Negative. Electronically Signed   By: Charlett Nose M.D.   On: 08/28/2022 22:05   CT Angio Chest PE W and/or Wo Contrast  Result Date: 08/28/2022 CLINICAL DATA:  Diarrhea and abdominal pain. Shortness of breath. Known metastatic melanoma. EXAM: CT ANGIOGRAPHY CHEST WITH CONTRAST TECHNIQUE: Multidetector CT imaging of the chest was performed using the standard protocol during bolus administration of intravenous contrast. Multiplanar CT image reconstructions and MIPs were obtained to evaluate the vascular anatomy. RADIATION DOSE REDUCTION: This exam was performed according to the departmental dose-optimization  program which includes automated exposure control, adjustment of the mA and/or kV according to patient size and/or use of iterative reconstruction technique. CONTRAST:  80mL OMNIPAQUE IOHEXOL 350 MG/ML SOLN COMPARISON:  Chest radiograph performed the same day and PET-CT dated 08/08/2022. FINDINGS: Cardiovascular: Satisfactory opacification of the pulmonary arteries to the segmental level. No evidence of pulmonary embolism. There are coronary artery calcifications. The heart is enlarged. No pericardial effusion. A right internal jugular central venous port catheter tip terminates at the superior cavoatrial junction. Mediastinum/Nodes: Numerous enlarged mediastinal and hilar lymph nodes appear similar to prior exam. For reference, a precarinal lymph node measures 1.3 cm in short axis (series 4, image 49). No enlarged axillary lymph nodes. Thyroid gland, trachea, and esophagus demonstrate no significant findings. Lungs/Pleura: There is moderate bilateral lower lobe  atelectasis, increased compared to 08/08/2022. Multiple bulla are seen in the left upper lobe. No pleural effusion or pneumothorax. Upper Abdomen: The liver has a nodular surface contour, unchanged. Previously described metastatic disease in the liver is not appreciated on this exam. Musculoskeletal: Degenerative changes are seen in the spine. Review of the MIP images confirms the above findings. IMPRESSION: 1. No evidence of pulmonary embolism. 2. Moderate bilateral lower lobe atelectasis, increased compared to 08/08/2022. 3. Numerous enlarged mediastinal and hilar lymph nodes appear similar to prior exam and are consistent with metastatic disease. Electronically Signed   By: Romona Curls M.D.   On: 08/28/2022 17:58   CT ABDOMEN PELVIS W CONTRAST  Result Date: 08/28/2022 CLINICAL DATA:  Acute abdominal pain.  Metastatic melanoma. EXAM: CT ABDOMEN AND PELVIS WITH CONTRAST TECHNIQUE: Multidetector CT imaging of the abdomen and pelvis was performed using the standard protocol following bolus administration of intravenous contrast. RADIATION DOSE REDUCTION: This exam was performed according to the departmental dose-optimization program which includes automated exposure control, adjustment of the mA and/or kV according to patient size and/or use of iterative reconstruction technique. CONTRAST:  80mL OMNIPAQUE IOHEXOL 350 MG/ML SOLN COMPARISON:  PET-CT 08/08/2022 FINDINGS: Lower chest: There is a small amount of atelectasis in the lung bases. Hepatobiliary: Hypodense hepatic metastatic lesion in the dome measures 2.0 x 2.6 cm and appears grossly unchanged. The liver is diffusely heterogeneous and small additional nodular densities can not be excluded on this study. Progression of metastatic disease is not excluded. Nodular liver contour appears stable. Gallbladder and bile ducts are within normal limits. Pancreas: Unremarkable. No pancreatic ductal dilatation or surrounding inflammatory changes. Spleen: Normal in  size without focal abnormality. Adrenals/Urinary Tract: Adrenal glands are unremarkable. Kidneys are normal, without renal calculi, focal lesion, or hydronephrosis. Bladder is unremarkable. Stomach/Bowel: There is mild wall thickening of the cecum with trace surrounding inflammatory stranding. There is no bowel obstruction, pneumatosis or free air. The appendix is within normal limits. Small bowel and stomach are within normal limits. Duodenal diverticulum is present. Vascular/Lymphatic: Aortic atherosclerosis. No enlarged abdominal or pelvic lymph nodes. Reproductive: Status post hysterectomy. No adnexal masses. Other: There is a small amount of free fluid in the lower abdomen and pelvis. Musculoskeletal: No acute or significant osseous findings. IMPRESSION: 1. Mild wall thickening of the cecum with trace surrounding inflammatory stranding worrisome for infectious or inflammatory colitis. 2. Small amount of free fluid in the lower abdomen and pelvis. 3. Dominant hepatic lesion appears grossly unchanged. The liver is diffusely heterogeneous and additional small nodular densities can not be excluded on this study. Progression of metastatic disease is not excluded. Consider further evaluation with MRI. 4. Aortic atherosclerosis. Aortic Atherosclerosis (ICD10-I70.0). Electronically Signed  By: Darliss Cheney M.D.   On: 08/28/2022 17:48   DG Chest 2 View  Result Date: 08/28/2022 CLINICAL DATA:  Shortness of breath. EXAM: CHEST - 2 VIEW COMPARISON:  10/08/2010, PET-CT 08/08/2022 FINDINGS: Right IJ Port-A-Cath has tip over the SVC. Lungs are hypoinflated with minimal bibasilar linear density left worse than right likely atelectasis as seen on recent PET-CT. No significant effusion. Cardiomediastinal silhouette and remainder the exam is unchanged. IMPRESSION: Hypoinflation with minimal bibasilar linear density left worse than right likely atelectasis. Electronically Signed   By: Elberta Fortis M.D.   On: 08/28/2022  15:01    Disposition Plan & Communication  Patient status: Inpatient  Admitted From: Home Planned disposition location: Home Anticipated discharge date: 8/21 pending clinical improvement and GOC for dispo  Family Communication: son at bedside    Author: Leeroy Bock, DO [facility] 08/29/2022, 8:28 AM   Available by Epic secure chat 7AM-7PM. If 7PM-7AM, please contact night-coverage.  [facility] contact information found on [email or web address].

## 2022-08-29 NOTE — Progress Notes (Signed)
ANTICOAGULATION CONSULT NOTE - Initial Consult  Pharmacy Consult for Iv heparin Indication: chest pain/ACS  Allergies  Allergen Reactions   Latex Rash    From Bandaids   Sulfa Antibiotics Nausea Only    Patient Measurements: Height: 5\' 5"  (165.1 cm) Weight: 76.7 kg (169 lb) IBW/kg (Calculated) : 57 Heparin Dosing Weight: 72.9 kg  Vital Signs: Temp: 97.9 F (36.6 C) (08/19 0015) Temp Source: Oral (08/19 0015) BP: 114/62 (08/19 0030) Pulse Rate: 71 (08/19 0030)  Labs: Recent Labs    08/28/22 1424 08/28/22 1613 08/28/22 1926 08/28/22 1932 08/28/22 2148 08/29/22 0205  HGB 15.5*  --   --   --   --   --   HCT 47.1*  --   --   --   --   --   PLT 300  --   --   --   --   --   APTT  --   --  36  --   --   --   LABPROT  --   --  17.8*  --   --   --   INR  --   --  1.4*  --   --   --   HEPARINUNFRC  --   --   --   --   --  0.20*  CREATININE 0.73  --   --   --   --   --   TROPONINIHS 319* 333*  --  359* 381*  --     Estimated Creatinine Clearance: 60.3 mL/min (by C-G formula based on SCr of 0.73 mg/dL).   Medical History: 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HCC)    GERD (gastroesophageal reflux disease)    Hyperlipidemia    Hypothyroidism    Insomnia     Medications:  No prior to admission anticoagulation  Assessment: 77 year old female being admitted with ACS. Pharmacy has been consulted to manage IV heparin  H&H stable. APTT and PT-INR to be collected -- spoke with RN. RN to draw labs prior to starting heparin   Goal of Therapy:  Heparin level 0.3-0.7 units/ml Monitor platelets by anticoagulation protocol: Yes   Plan:  8/19:  HL @ 0205 = 0.20, SUBtherapeutic - Will order heparin 1100 units IV X 1 bolus and increase drip rate to 1000 units/hr - Will recheck HL 8 hrs after rate change   Toshi Ishii D Clinical Pharmacist  08/29/2022 2:43 AM

## 2022-08-29 NOTE — Consult Note (Signed)
Cardiology Consultation   Patient ID: TAGAN MCKINNEY MRN: [id number]; DOB: 1945-01-28  Admit date: 08/28/2022 Date of Consult: 08/29/2022  PCP:  Marina Goodell, MD   [facility] Providers Cardiologist: New  Patient Profile:   KESTRA ARMISTEAD is a 77 y.o. 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  History of Present Illness:   Ms. Mangar has not been seen by cardiology in the past. She has no h/o of heart attack or stenting. No alcohol, drug or tobacco history.  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  Patient has gone through immunotherapy and is scheduled to start chemotherapy at [facility].  Patient presented to the ER 08/28/2022 with shortness of breath for the last 2 weeks.  she also noted lower extremity swelling and intermittent nausea, diarrhea, and abdominal pain.  Patient denied chest pain. She denied fever or chest pain. She reports difficulty swallowing.   In the ER blood pressure 122/87, pulse rate 108 bpm, respiratory rate 18, afebrile, 94% O2.  EKG showed sinus tach with a heart rate of 1 to 2 bpm, LAD.  Nonspecific changes.  Chest x-ray showed hypoinflation.  Chest CTA negative for PE, moderate bilateral atelectasis, numerous enlarged mediastinal lymph noise.  CT scan abdomen pelvis showed mild thickening of the cecum with stranding concerning for possible colitis.  High-sensitivity troponin 19, 323, 359, 381.  BNP normal.  Lipase 35.  TSH 4.13.  Patient was started on IV heparin and admitted for further workup.   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HCC)    GERD (gastroesophageal reflux disease)    Hyperlipidemia    Hypothyroidism    Insomnia     Past Surgical History:  Procedure Laterality Date   ABDOMINAL HYSTERECTOMY     CATARACT  EXTRACTION W/ INTRAOCULAR LENS  IMPLANT, BILATERAL     COLONOSCOPY WITH PROPOFOL N/A 12/17/2015   Procedure: COLONOSCOPY WITH PROPOFOL;  Surgeon: Midge Minium, MD;  Location: [facility];  Service: Endoscopy;  Laterality: N/A;   COLONOSCOPY WITH PROPOFOL N/A 02/23/2021   Procedure: COLONOSCOPY WITH PROPOFOL;  Surgeon: Midge Minium, MD;  Location: [facility] END[facility]COPY;  Service: Endoscopy;  Laterality: N/A;   COLONOSCOPY WITH PROPOFOL N/A 07/20/2021   Procedure: COLONOSCOPY WITH PROPOFOL;  Surgeon: Midge Minium, MD;  Location: [facility] ENDOSCOPY;  Service: Endoscopy;  Laterality: N/A;   ESOPHAGOGASTRODUODENOSCOPY (EGD) WITH PROPOFOL N/A 12/17/2015   Procedure: ESOPHAGOGASTRODUODENOSCOPY (EGD) WITH PROPOFOL;  Surgeon: Midge Minium, MD;  Location: [facility];  Service: Endoscopy;  Laterality: N/A;  Latex sensitivity   EYE SURGERY     IR IMAGING GUIDED PORT INSERTION  07/15/2022   NASAL SINUS SURGERY     POLYPECTOMY N/A 12/17/2015   Procedure: POLYPECTOMY;  Surgeon: Midge Minium, MD;  Location: [facility];  Service: Endoscopy;  Laterality: N/A;   ROOT CANAL     TUBAL LIGATION       Home Medications:  Prior to Admission medications   Medication Sig Start Date End Date Taking? Authorizing Provider  acetaminophen (TYLENOL) 650 MG CR tablet Take 1,300 mg by mouth.    [provider]  Calcium Carbonate-Vitamin D 600-400 MG-UNIT tablet Take by mouth.    [provider]  Cyanocobalamin 1000 MCG TBCR Take by mouth.    [provider]  diphenhydrAMINE (  BENADRYL) 25 mg capsule Take by mouth.    [provider]  docusate sodium (COLACE) 100 MG capsule Take 100 mg by mouth.    [provider]  ibuprofen (ADVIL) 600 MG tablet Take 600 mg by mouth 3 (three) times daily. 02/01/22   [provider]  levothyroxine (SYNTHROID, LEVOTHROID) 50 MCG tablet TAKE 1 TABLET (50 MCG TOTAL) BY MOUTH ONCE DAILY. 06/29/15   [provider]   lidocaine-prilocaine (EMLA) cream Apply to affected area once 06/14/22   Creig Hines, MD  Omega-3 Fatty Acids (FISH OIL PO) Take by mouth.    [provider]  ondansetron (ZOFRAN) 8 MG tablet Take 1 tablet (8 mg total) by mouth every 8 (eight) hours as needed for nausea or vomiting. 06/14/22   Creig Hines, MD  pantoprazole (PROTONIX) 40 MG tablet Take 40 mg by mouth 2 (two) times daily. 12/20/20   [provider]  pravastatin (PRAVACHOL) 20 MG tablet Take by mouth. 06/02/15 08/10/22  [provider]  prochlorperazine (COMPAZINE) 10 MG tablet Take 1 tablet (10 mg total) by mouth every 6 (six) hours as needed for nausea or vomiting. 06/14/22   Creig Hines, MD  sodium chloride (OCEAN) 0.65 % nasal spray Place into the nose.    [provider]    Inpatient Medications: Scheduled Meds:  aspirin EC  81 mg Oral Daily   Chlorhexidine Gluconate Cloth  6 each Topical QHS   metoprolol tartrate  12.5 mg Oral BID   pantoprazole (PROTONIX) IV  40 mg Intravenous Q24H   Continuous Infusions:  sodium chloride 50 mL/hr at 08/29/22 0602   cefTRIAXone (ROCEPHIN)  IV Stopped (08/29/22 7253)   heparin 1,000 Units/hr (08/29/22 0728)   metronidazole Stopped (08/29/22 0110)   PRN Meds: acetaminophen, nitroGLYCERIN, ondansetron (ZOFRAN) IV  Allergies:    Allergies  Allergen Reactions   Latex Rash    From Bandaids   Sulfa Antibiotics Nausea Only    Social History:   Social History   Socioeconomic History   Marital status: Married    Spouse name: Not on file   Number of children: Not on file   Years of education: Not on file   Highest education level: Not on file  Occupational History   Not on file  Tobacco Use   Smoking status: Never   Smokeless tobacco: Never  Vaping Use   Vaping status: Never Used  Substance and Sexual Activity   Alcohol use: No   Drug use: No   Sexual activity: Not Currently    Birth control/protection: None  Other Topics Concern    Not on file  Social History Narrative   Not on file   Social Determinants of Health   Financial Resource Strain: Not on file  Food Insecurity: No Food Insecurity (08/29/2022)   Hunger Vital Sign    Worried About Running Out of Food in the Last Year: Never true    Ran Out of Food in the Last Year: Never true  Transportation Needs: No Transportation Needs (08/29/2022)   PRAPARE - Administrator, Civil Service (Medical): No    Lack of Transportation (Non-Medical): No  Physical Activity: Not on file  Stress: Not on file  Social Connections: Not on file  Intimate Partner Violence: Not At Risk (08/29/2022)   Humiliation, Afraid, Rape, and Kick questionnaire    Fear of Current or Ex-Partner: No    Emotionally Abused: No    Physically Abused: No  Sexually Abused: No    Family History:    Family History  Problem Relation Age of Onset   Coronary artery disease Mother    Cancer Father    Coronary artery disease Father    Osteoarthritis Brother    Cancer Brother      ROS:  Please see the history of present illness.   All other ROS reviewed and negative.     Physical Exam/Data:   Vitals:   08/29/22 0529 08/29/22 0600 08/29/22 0700 08/29/22 0750  BP:  (!) 141/76 126/66 122/71  Pulse:  86 82 88  Resp:  (!) 27 20 (!) 26  Temp: 97.8 F (36.6 C)   97.8 F (36.6 C)  TempSrc: Oral   Oral  SpO2:  96% 97% 97%  Weight:    76.7 kg  Height:    5\' 5"  (1.651 m)    Intake/Output Summary (Last 24 hours) at 08/29/2022 0844 Last data filed at 08/29/2022 0728 Gross per 24 hour  Intake 151.53 ml  Output --  Net 151.53 ml      08/29/2022    7:50 AM 08/28/2022    2:22 PM 08/10/2022    9:19 AM  Last 3 Weights  Weight (lbs) 169 lb 169 lb 169 lb 11.2 oz  Weight (kg) 76.658 kg 76.658 kg 76.975 kg     Body mass index is 28.12 kg/m.  General:  Well nourished, well developed, in no acute distress HEENT: normal Neck: no JVD Vascular: No carotid bruits; Distal pulses 2+  bilaterally Cardiac:  normal S1, S2; RRR; no murmur  Lungs: diminished at bases  Abd: soft, nontender, no hepatomegaly  Ext: no edema Musculoskeletal:  No deformities, BUE and BLE strength normal and equal Skin: warm and dry  Neuro:  CNs 2-12 intact, no focal abnormalities noted Psych:  Normal affect   EKG:  The EKG was personally reviewed and demonstrates:  ST 102bpm, LAD, low voltage QRS Telemetry:  Telemetry was personally reviewed and demonstrates:  NSR PACs, HR 80s,  Relevant CV Studies:  Echo ordered  Laboratory Data:  High Sensitivity Troponin:   Recent Labs  Lab 08/28/22 1424 08/28/22 1613 08/28/22 1932 08/28/22 2148  TROPONINIHS 319* 333* 359* 381*     Chemistry Recent Labs  Lab 08/28/22 1424  NA 135  K 3.8  CL 101  CO2 25  GLUCOSE 110*  BUN 10  CREATININE 0.73  CALCIUM 8.3*  GFRNONAA >60  ANIONGAP 9    Recent Labs  Lab 08/28/22 1424  PROT 5.3*  ALBUMIN 2.8*  AST 164*  ALT 109*  ALKPHOS 160*  BILITOT 2.7*   Lipids  Recent Labs  Lab 08/29/22 0530  CHOL 121  TRIG 58  HDL 26*  LDLCALC 83  CHOLHDL 4.7    Hematology Recent Labs  Lab 08/28/22 1424 08/29/22 0530  WBC 10.6* 8.9  RBC 5.12* 4.51  HGB 15.5* 13.7  HCT 47.1* 42.1  MCV 92.0 93.3  MCH 30.3 30.4  MCHC 32.9 32.5  RDW 15.3 15.5  PLT 300 254   Thyroid  Recent Labs  Lab 08/28/22 1932  TSH 4.132    BNP Recent Labs  Lab 08/28/22 1424  BNP 41.7    DDimer No results for input(s): "DDIMER" in the last 168 hours.   Radiology/Studies:  US Venous Img Lower Unilateral Left (DVT)  Result Date: 08/28/2022 CLINICAL DATA:  Pain, swelling of left lower extremity EXAM: LEFT LOWER EXTREMITY VENOUS DOPPLER ULTRASOUND TECHNIQUE: Gray-scale sonography with compression, as well as  color and duplex ultrasound, were performed to evaluate the deep venous system(s) from the level of the common femoral vein through the popliteal and proximal calf veins. COMPARISON:  None Available.  FINDINGS: VENOUS Normal compressibility of the common femoral, superficial femoral, and popliteal veins, as well as the visualized calf veins. Visualized portions of profunda femoral vein and great saphenous vein unremarkable. No filling defects to suggest DVT on grayscale or color Doppler imaging. Doppler waveforms show normal direction of venous flow, normal respiratory plasticity and response to augmentation. Limited views of the contralateral common femoral vein are unremarkable. OTHER None. Limitations: none IMPRESSION: Negative. Electronically Signed   By: Charlett Nose M.D.   On: 08/28/2022 22:05   CT Angio Chest PE W and/or Wo Contrast  Result Date: 08/28/2022 CLINICAL DATA:  Diarrhea and abdominal pain. Shortness of breath. Known metastatic melanoma. EXAM: CT ANGIOGRAPHY CHEST WITH CONTRAST TECHNIQUE: Multidetector CT imaging of the chest was performed using the standard protocol during bolus administration of intravenous contrast. Multiplanar CT image reconstructions and MIPs were obtained to evaluate the vascular anatomy. RADIATION DOSE REDUCTION: This exam was performed according to the departmental dose-optimization program which includes automated exposure control, adjustment of the mA and/or kV according to patient size and/or use of iterative reconstruction technique. CONTRAST:  80mL OMNIPAQUE IOHEXOL 350 MG/ML SOLN COMPARISON:  Chest radiograph performed the same day and PET-CT dated 08/08/2022. FINDINGS: Cardiovascular: Satisfactory opacification of the pulmonary arteries to the segmental level. No evidence of pulmonary embolism. There are coronary artery calcifications. The heart is enlarged. No pericardial effusion. A right internal jugular central venous port catheter tip terminates at the superior cavoatrial junction. Mediastinum/Nodes: Numerous enlarged mediastinal and hilar lymph nodes appear similar to prior exam. For reference, a precarinal lymph node measures 1.3 cm in short axis  (series 4, image 49). No enlarged axillary lymph nodes. Thyroid gland, trachea, and esophagus demonstrate no significant findings. Lungs/Pleura: There is moderate bilateral lower lobe atelectasis, increased compared to 08/08/2022. Multiple bulla are seen in the left upper lobe. No pleural effusion or pneumothorax. Upper Abdomen: The liver has a nodular surface contour, unchanged. Previously described metastatic disease in the liver is not appreciated on this exam. Musculoskeletal: Degenerative changes are seen in the spine. Review of the MIP images confirms the above findings. IMPRESSION: 1. No evidence of pulmonary embolism. 2. Moderate bilateral lower lobe atelectasis, increased compared to 08/08/2022. 3. Numerous enlarged mediastinal and hilar lymph nodes appear similar to prior exam and are consistent with metastatic disease. Electronically Signed   By: Romona Curls M.D.   On: 08/28/2022 17:58   CT ABDOMEN PELVIS W CONTRAST  Result Date: 08/28/2022 CLINICAL DATA:  Acute abdominal pain.  Metastatic melanoma. EXAM: CT ABDOMEN AND PELVIS WITH CONTRAST TECHNIQUE: Multidetector CT imaging of the abdomen and pelvis was performed using the standard protocol following bolus administration of intravenous contrast. RADIATION DOSE REDUCTION: This exam was performed according to the departmental dose-optimization program which includes automated exposure control, adjustment of the mA and/or kV according to patient size and/or use of iterative reconstruction technique. CONTRAST:  80mL OMNIPAQUE IOHEXOL 350 MG/ML SOLN COMPARISON:  PET-CT 08/08/2022 FINDINGS: Lower chest: There is a small amount of atelectasis in the lung bases. Hepatobiliary: Hypodense hepatic metastatic lesion in the dome measures 2.0 x 2.6 cm and appears grossly unchanged. The liver is diffusely heterogeneous and small additional nodular densities can not be excluded on this study. Progression of metastatic disease is not excluded. Nodular liver  contour appears stable. Gallbladder and  bile ducts are within normal limits. Pancreas: Unremarkable. No pancreatic ductal dilatation or surrounding inflammatory changes. Spleen: Normal in size without focal abnormality. Adrenals/Urinary Tract: Adrenal glands are unremarkable. Kidneys are normal, without renal calculi, focal lesion, or hydronephrosis. Bladder is unremarkable. Stomach/Bowel: There is mild wall thickening of the cecum with trace surrounding inflammatory stranding. There is no bowel obstruction, pneumatosis or free air. The appendix is within normal limits. Small bowel and stomach are within normal limits. Duodenal diverticulum is present. Vascular/Lymphatic: Aortic atherosclerosis. No enlarged abdominal or pelvic lymph nodes. Reproductive: Status post hysterectomy. No adnexal masses. Other: There is a small amount of free fluid in the lower abdomen and pelvis. Musculoskeletal: No acute or significant osseous findings. IMPRESSION: 1. Mild wall thickening of the cecum with trace surrounding inflammatory stranding worrisome for infectious or inflammatory colitis. 2. Small amount of free fluid in the lower abdomen and pelvis. 3. Dominant hepatic lesion appears grossly unchanged. The liver is diffusely heterogeneous and additional small nodular densities can not be excluded on this study. Progression of metastatic disease is not excluded. Consider further evaluation with MRI. 4. Aortic atherosclerosis. Aortic Atherosclerosis (ICD10-I70.0). Electronically Signed   By: Darliss Cheney M.D.   On: 08/28/2022 17:48   DG Chest 2 View  Result Date: 08/28/2022 CLINICAL DATA:  Shortness of breath. EXAM: CHEST - 2 VIEW COMPARISON:  10/08/2010, PET-CT 08/08/2022 FINDINGS: Right IJ Port-A-Cath has tip over the SVC. Lungs are hypoinflated with minimal bibasilar linear density left worse than right likely atelectasis as seen on recent PET-CT. No significant effusion. Cardiomediastinal silhouette and remainder the exam  is unchanged. IMPRESSION: Hypoinflation with minimal bibasilar linear density left worse than right likely atelectasis. Electronically Signed   By: Elberta Fortis M.D.   On: 08/28/2022 15:01     Assessment and Plan:   Elevated troponin ?NSTEMI - patient presented with SOB, nausea, abdominal pain, and diarrhea with LLE for the last 1-2 weeks. she was found to have colitis and elevated LFTS. No chest pain reported - HS troponin elevated to the 300s with flat trend - started on IV heparin - No active chest pain reported - no prior h/o CAD - Chest CTA showed coronary artery calcifications - echo ordered - continue ASA 81mg  daily and Lopressor 12.5mg  BID - continue IV heparin for now. We will await echo. Needs to recover from acute colitis prior to further ischemic work-up. Overall reassuring she has no active chest pain.  Colitis - GI panel ordered - abx per IM  Ocular melanoma - on infusion therapy - followed by [facility]  HLD - statin held due to elevated LFTs - LDL 83  Elevated LFTs - h/o abnormal Lfts - plan for MRI to investigate liver nodule   For questions or updates, please contact [facility] Please consult [email or web address] for contact info under    Signed, Chanda Laperle David Stall, PA-C  08/29/2022 8:44 AM

## 2022-08-29 NOTE — Progress Notes (Signed)
*  PRELIMINARY RESULTS* Echocardiogram 2D Echocardiogram has been performed.  Carolyne Fiscal 08/29/2022, 11:09 AM

## 2022-08-29 NOTE — Progress Notes (Signed)
ANTICOAGULATION CONSULT NOTE  Pharmacy Consult for Iv heparin Indication: chest pain/ACS  Allergies  Allergen Reactions   Latex Rash    From Bandaids   Sulfa Antibiotics Nausea Only    Patient Measurements: Height: 5\' 5"  (165.1 cm) Weight: 76.7 kg (169 lb) IBW/kg (Calculated) : 57 Heparin Dosing Weight: 72.9 kg  Vital Signs: Temp: 97.8 F (36.6 C) (08/19 0529) Temp Source: Oral (08/19 0529) BP: 141/76 (08/19 0600) Pulse Rate: 86 (08/19 0600)  Labs: Recent Labs    08/28/22 1424 08/28/22 1613 08/28/22 1926 08/28/22 1932 08/28/22 2148 08/29/22 0205 08/29/22 0530  HGB 15.5*  --   --   --   --   --  13.7  HCT 47.1*  --   --   --   --   --  42.1  PLT 300  --   --   --   --   --  254  APTT  --   --  36  --   --   --   --   LABPROT  --   --  17.8*  --   --   --   --   INR  --   --  1.4*  --   --   --   --   HEPARINUNFRC  --   --   --   --   --  0.20*  --   CREATININE 0.73  --   --   --   --   --   --   TROPONINIHS 319* 333*  --  359* 381*  --   --     Estimated Creatinine Clearance: 60.3 mL/min (by C-G formula based on SCr of 0.73 mg/dL).   Medical History: 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HCC)    GERD (gastroesophageal reflux disease)    Hyperlipidemia    Hypothyroidism    Insomnia     Medications:  No prior to admission anticoagulation  Assessment: 77 year old female being admitted with ACS. Pharmacy has been consulted to manage IV heparin.  Baseline labs: INR 1.4, aPTT 36s, H&H/PLT wnl  Goal of Therapy:  Heparin level 0.3-0.7 units/ml Monitor platelets by anticoagulation protocol: Yes   Plan: despite recent rate change heparin level remains SUBtherapeutic - Will order heparin 2200 units IV X 1 bolus and increase infusion rate to 1300 units/hr - Will recheck HL 8 hrs after rate change   Lowella Bandy Clinical Pharmacist  08/29/2022 7:10 AM

## 2022-08-29 NOTE — TOC CM/SW Note (Signed)
Cm received call from Dr. Jamelle Rushing about son having questions about Medicare. Cm called son and LVM. Cm will try and talk with son at a later time.

## 2022-08-29 NOTE — Progress Notes (Addendum)
       CROSS COVER NOTE  NAME: Christine Savage MRN: [id number] DOB : 03/18/1945    Concern as stated by nurse / staff   Hey Dr Para March patient is here for Nausea, vomiting, diarrhea just started on immunotherapy for Eye cancer with mets also diagnosed with an NSTEMI...Marland KitchenMarland KitchenPatient is expressing that she is unable to take her night time medication due to issues swallowing she says she has had this issue for 2 weeks..The night time med she has due is Metoprolol 12.5mg  I was just wondering if she could have it IV or just mark it as not given      Pertinent findings on chart review: Current orders and vitals reviewed    08/29/2022    8:00 PM 08/29/2022    7:00 PM 08/29/2022    6:00 PM  Vitals with BMI  Systolic 131 105 086  Diastolic 75 64 64  Pulse 92 84 97     Assessment and  Interventions   Assessment: Difficulty swallowing  Plan: Will replace nighttime oral metoprolol 12.5 mg with IV metoprolol 2.5 SLP consult for the a.m. N.p.o. until evaluated by SLP Aspiration precautions

## 2022-08-29 NOTE — Progress Notes (Signed)
ANTICOAGULATION CONSULT NOTE  Pharmacy Consult for Iv heparin Indication: chest pain/ACS  Allergies  Allergen Reactions   Latex Rash    From Bandaids   Sulfa Antibiotics Nausea Only    Patient Measurements: Height: 5\' 5"  (165.1 cm) Weight: 76.7 kg (169 lb) IBW/kg (Calculated) : 57 Heparin Dosing Weight: 72.9 kg  Vital Signs: Temp: 97.9 F (36.6 C) (08/19 2000) Temp Source: Oral (08/19 2000) BP: 109/60 (08/19 2300) Pulse Rate: 86 (08/19 2300)  Labs: Recent Labs    08/28/22 1424 08/28/22 1613 08/28/22 1926 08/28/22 1932 08/28/22 2148 08/29/22 0205 08/29/22 0530 08/29/22 1141 08/29/22 2316  HGB 15.5*  --   --   --   --   --  13.7  --   --   HCT 47.1*  --   --   --   --   --  42.1  --   --   PLT 300  --   --   --   --   --  254  --   --   APTT  --   --  36  --   --   --   --   --   --   LABPROT  --   --  17.8*  --   --   --   --   --   --   INR  --   --  1.4*  --   --   --   --   --   --   HEPARINUNFRC  --   --   --   --   --  0.20*  --  0.10* 0.63  CREATININE 0.73  --   --   --   --   --   --   --   --   TROPONINIHS 319* 333*  --  359* 381*  --   --   --   --     Estimated Creatinine Clearance: 60.3 mL/min (by C-G formula based on SCr of 0.73 mg/dL).   Medical History: 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HCC)    GERD (gastroesophageal reflux disease)    Hyperlipidemia    Hypothyroidism    Insomnia     Medications:  No prior to admission anticoagulation  Assessment: 77 year old female being admitted with ACS. Pharmacy has been consulted to manage IV heparin.  Baseline labs: INR 1.4, aPTT 36s, H&H/PLT wnl  Goal of Therapy:  Heparin level 0.3-0.7 units/ml Monitor platelets by anticoagulation protocol: Yes   Plan: Heparin level therapeutic x 1 - Will continue heparin infusion rate at 1300 units/hr - Will recheck HL w/ AM labs to confirm - CBC daily while on heparin  Otelia Sergeant, PharmD, [facility] 08/29/2022 11:52  PM

## 2022-08-30 DIAGNOSIS — Z7189 Other specified counseling: Secondary | ICD-10-CM | POA: Diagnosis not present

## 2022-08-30 DIAGNOSIS — C787 Secondary malignant neoplasm of liver and intrahepatic bile duct: Secondary | ICD-10-CM

## 2022-08-30 DIAGNOSIS — K529 Noninfective gastroenteritis and colitis, unspecified: Secondary | ICD-10-CM | POA: Diagnosis not present

## 2022-08-30 DIAGNOSIS — Z515 Encounter for palliative care: Secondary | ICD-10-CM | POA: Diagnosis not present

## 2022-08-30 DIAGNOSIS — I214 Non-ST elevation (NSTEMI) myocardial infarction: Secondary | ICD-10-CM | POA: Diagnosis not present

## 2022-08-30 LAB — HEPATIC FUNCTION PANEL
ALT: 74 U/L — ABNORMAL HIGH (ref 0–44)
AST: 104 U/L — ABNORMAL HIGH (ref 15–41)
Albumin: 2.1 g/dL — ABNORMAL LOW (ref 3.5–5.0)
Alkaline Phosphatase: 123 U/L (ref 38–126)
Bilirubin, Direct: 0.4 mg/dL — ABNORMAL HIGH (ref 0.0–0.2)
Indirect Bilirubin: 1 mg/dL — ABNORMAL HIGH (ref 0.3–0.9)
Total Bilirubin: 1.4 mg/dL — ABNORMAL HIGH (ref 0.3–1.2)
Total Protein: 4.2 g/dL — ABNORMAL LOW (ref 6.5–8.1)

## 2022-08-30 LAB — BASIC METABOLIC PANEL
Anion gap: 5 (ref 5–15)
BUN: 11 mg/dL (ref 8–23)
CO2: 25 mmol/L (ref 22–32)
Calcium: 7.5 mg/dL — ABNORMAL LOW (ref 8.9–10.3)
Chloride: 106 mmol/L (ref 98–111)
Creatinine, Ser: 0.75 mg/dL (ref 0.44–1.00)
GFR, Estimated: >60 mL/min (ref >60)
Glucose, Bld: 105 mg/dL — ABNORMAL HIGH (ref 70–99)
Potassium: 3.7 mmol/L (ref 3.5–5.1)
Sodium: 136 mmol/L (ref 135–145)

## 2022-08-30 LAB — CBC
HCT: 39.2 % (ref 36.0–46.0)
Hemoglobin: 13.2 g/dL (ref 12.0–15.0)
MCH: 31.1 pg (ref 26.0–34.0)
MCHC: 33.7 g/dL (ref 30.0–36.0)
MCV: 92.2 fL (ref 80.0–100.0)
Platelets: 231 10*3/uL (ref 150–400)
RBC: 4.25 MIL/uL (ref 3.87–5.11)
RDW: 15.5 % (ref 11.5–15.5)
WBC: 8.6 10*3/uL (ref 4.0–10.5)
nRBC: 0 % (ref 0.0–0.2)

## 2022-08-30 LAB — LIPOPROTEIN A (LPA): Lipoprotein (a): <8.4 nmol/L (ref <75.0)

## 2022-08-30 LAB — HEPARIN LEVEL (UNFRACTIONATED): Heparin Unfractionated: 0.65 [IU]/mL (ref 0.30–0.70)

## 2022-08-30 MED ORDER — ENSURE ENLIVE PO LIQD
237.0000 mL | Freq: Two times a day (BID) | ORAL | Status: DC
  Administered 2022-08-30: 237 mL via ORAL

## 2022-08-30 MED ORDER — ASPIRIN 81 MG PO CHEW: 81.0000 mg | CHEWABLE_TABLET | Freq: Every day | ORAL | Status: DC

## 2022-08-30 MED ORDER — ASPIRIN 81 MG PO TBEC: 81.0000 mg | DELAYED_RELEASE_TABLET | Freq: Every day | ORAL | Status: DC

## 2022-08-30 MED ORDER — ADULT MULTIVITAMIN W/MINERALS CH
1.0000 | ORAL_TABLET | Freq: Every day | ORAL | Status: DC
  Administered 2022-08-31 – 2022-09-01 (×2): 1 via ORAL
  Filled 2022-08-30 (×2): qty 1

## 2022-08-30 MED ORDER — DIPHENHYDRAMINE HCL 25 MG PO CAPS
25.0000 mg | ORAL_CAPSULE | Freq: Every evening | ORAL | Status: DC | PRN
  Administered 2022-08-30 – 2022-08-31 (×2): 25 mg via ORAL
  Filled 2022-08-30 (×2): qty 1

## 2022-08-30 MED ORDER — ASPIRIN 81 MG PO TBEC
81.0000 mg | DELAYED_RELEASE_TABLET | Freq: Every day | ORAL | Status: DC
  Administered 2022-08-31 – 2022-09-01 (×2): 81 mg via ORAL
  Filled 2022-08-30 (×2): qty 1

## 2022-08-30 NOTE — Evaluation (Addendum)
Clinical/Bedside Swallow Evaluation Patient Details  Name: Christine Savage MRN: [id number] Date of Birth: May 12, 1945  Today's Date: 08/30/2022 Time: SLP Start Time (ACUTE ONLY): 0850 SLP Stop Time (ACUTE ONLY): 0950 SLP Time Calculation (min) (ACUTE ONLY): 60 min  Past Medical History:  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HCC)    GERD (gastroesophageal reflux disease)    Hyperlipidemia    Hypothyroidism    Insomnia    Past Surgical History:  Past Surgical History:  Procedure Laterality Date   ABDOMINAL HYSTERECTOMY     CATARACT EXTRACTION W/ INTRAOCULAR LENS  IMPLANT, BILATERAL     COLONOSCOPY WITH PROPOFOL N/A 12/17/2015   Procedure: COLONOSCOPY WITH PROPOFOL;  Surgeon: Midge Minium, MD;  Location: [facility];  Service: Endoscopy;  Laterality: N/A;   COLONOSCOPY WITH PROPOFOL N/A 02/23/2021   Procedure: COLONOSCOPY WITH PROPOFOL;  Surgeon: Midge Minium, MD;  Location: [facility] ENDOSCOPY;  Service: Endoscopy;  Laterality: N/A;   COLONOSCOPY WITH PROPOFOL N/A 07/20/2021   Procedure: COLONOSCOPY WITH PROPOFOL;  Surgeon: Midge Minium, MD;  Location: [facility] ENDOSCOPY;  Service: Endoscopy;  Laterality: N/A;   ESOPHAGOGASTRODUODENOSCOPY (EGD) WITH PROPOFOL N/A 12/17/2015   Procedure: ESOPHAGOGASTRODUODENOSCOPY (EGD) WITH PROPOFOL;  Surgeon: Midge Minium, MD;  Location: [facility];  Service: Endoscopy;  Laterality: N/A;  Latex sensitivity   EYE SURGERY     IR IMAGING GUIDED PORT INSERTION  07/15/2022   NASAL SINUS SURGERY     POLYPECTOMY N/A 12/17/2015   Procedure: POLYPECTOMY;  Surgeon: Midge Minium, MD;  Location: [facility];  Service: Endoscopy;  Laterality: N/A;   ROOT CANAL     TUBAL LIGATION     HPI:  Pt is a 77 year old female with history of metastatic choroidal melanoma with liver metastases. PMH: a hx of arthritis, GERD, hyperlipidemia, hypothyroidism. She also notes lower extremity swelling and intermittent nausea, diarrhea, and  abdominal pain.  She is s/p 3 cycles of ipilimumab plus nivolumab immunotherapy with last cycle given on 08/10/2022.  Scans subsequently showed evidence of disease progression in the liver as well as progressive mediastinal adenopathy.  She was seen by Dr. Harold Hedge at [facility] and plan was to proceed with Tebentafusp.She was admitted to the hospital for symptoms of abdominal pain shortness of breath as well as diarrhea.  Since her hospital admission she has not had any significant diarrhea although CT abdomen showed evidence of mild wall thickening of the cecum concerning for inflammatory colitis.  She is also in the ICU and being treated for NSTEMI and is on heparin drip.  Chest CT Imaging: Moderate bilateral lower lobe atelectasis, increased compared to  08/08/2022.  3. Numerous enlarged mediastinal and hilar lymph nodes appear  similar to prior exam and are consistent with metastatic disease.    OF NOTE: Upper Endoscopy in 2017 revealed a Hiatal Hernia then.    Assessment / Plan / Recommendation  Clinical Impression  Pt seen for BSE today. Pt awake, verbal and engaged in conversation w/ this SLP describing Esophageal phase Dysmotility - she endorses Regurgitation and frothy Phlegm; food "stopping" in her chest and "coming back up". She stated she is only eating puree foods at home. MD made aware. On Storla O2 support 2L; afebrile. WBC WNL.  OF NOTE: Pt does strongly endorse s/s of REFLUX and Esophageal phase Dysmotility at home. She has self-limited herself to Pureed foods d/t episodes of food "stopping" in her chest and having increased amounts of phlegm(white, bubbly, stringy). She also  endorses Belching. Chest imaging in 2017 indicated a Hiatal Hernia then.  PER PT REPORT: Pt stated Oncology had spoken w/ her re: the "Numerous enlarged mediastinal and hilar lymph nodes" and maybe some pressure on the Esophagus(?). Recommend f/u w/ Oncology for further.    Pt appears to present w/ functional oropharyngeal phase  swallowing w/ No overt oropharyngeal phase dysphagia appreciated during oral intake of trials; No neuromuscular swallowing deficits appreciated. Pt appears at reduced risk for aspiration from an oropharyngeal phase standpoint following general aspiration precautions.  HOWEVER, pt has a Baseline presentation of REFLUX and episodes of Esophageal phase Dysmotility which can then impact Esophageal motility/clearing and increase risk for aspiration of REFLUX material.   Other challenging factors that could impact her oropharyngeal swallowing include fatigue/weakness, Baseline Cancer progression/tx, and Hiatal Hernia per chart. These factors can increase risk for aspiration, dysphagia as well as decreased oral intake overall.   During po trials, pt consumed consistencies w/ no overt coughing, decline in vocal quality, or change in respiratory presentation during/post trials. O2 sats remained 96-97%. Oral phase appeared [facility] w/ timely bolus management and control of bolus propulsion for A-P transfer for swallowing. Oral clearing achieved w/ all trial consistencies. No solids given as pt stated she "cannot eat those" d/t Esophageal phase Dysmotility. OM Exam appeared [facility] w/ no unilateral weakness noted. Speech Clear. Pt fed self w/ setup support.  Recommend a Puree consistency diet w/ well-moistened foods; Thin liquids via Cup. Recommend general aspiration precautions, REFLUX precautions. Reduce distractions and give support at meals w/ setup and positioning. Pills WHOLE va CRUSHED in Puree for safer, easier swallowing and Esophageal phase clearing.  Education given on Pills in Puree; food consistencies and easy to eat options; general aspiration and REFLUX precautions to pt and NSG. MD updated on above and recommendation to f/u w/ GI for consult and management moving forward. NSG to reconsult if any new needs arise. NSG agreed.  Recommend Dietician f/u for support. Palliative Care f/u for support.  SLP Visit  Diagnosis: Dysphagia, unspecified (R13.10) (Esophageal phase Dysmotility)    Aspiration Risk   (reduced following general aspiration precautions)    Diet Recommendation   Thin;Dysphagia 1 (puree) = a Puree consistency diet w/ well-moistened foods; Thin liquids via Cup. Recommend general aspiration precautions, REFLUX precautions. Reduce distractions and give support at meals w/ setup and positioning.   Medication Administration: Whole meds with puree (vs CRUSHED in puree for ease of clearing the Esophagus)    Other  Recommendations Recommended Consults: Consider GI evaluation;Consider esophageal assessment (GI consult for Esophageal phase Dysmotility assessment per pt's c/o Esophageal phase Dysmotility) Oral Care Recommendations: Oral care BID;Patient independent with oral care (setup)    Recommendations for follow up therapy are one component of a multi-disciplinary discharge planning process, led by the attending physician.  Recommendations may be updated based on patient status, additional functional criteria and insurance authorization.  Follow up Recommendations No SLP follow up - Esophageal phase Dysmotility identified.     Assistance Recommended at Discharge  intermittent  Functional Status Assessment Patient has had a recent decline in their functional status and/or demonstrates limited ability to make significant improvements in function in a reasonable and predictable amount of time  Frequency and Duration  (n/a)   (n/a)       Prognosis Prognosis for improved oropharyngeal function: Fair Barriers to Reach Goals: Time post onset;Severity of deficits Barriers/Prognosis Comment: Esophageal phase Dysmotility; Ca      Swallow Study   General Date of  Onset: 08/28/22 HPI: Pt is a 77 year old female with history of metastatic choroidal melanoma with liver metastases. PMH: a hx of arthritis, GERD, hyperlipidemia, hypothyroidism. She also notes lower extremity swelling and  intermittent nausea, diarrhea, and abdominal pain.  She is s/p 3 cycles of ipilimumab plus nivolumab immunotherapy with last cycle given on 08/10/2022.  Scans subsequently showed evidence of disease progression in the liver as well as progressive mediastinal adenopathy.  She was seen by Dr. Harold Hedge at [facility] and plan was to proceed with Tebentafusp.She was admitted to the hospital for symptoms of abdominal pain shortness of breath as well as diarrhea.  Since her hospital admission she has not had any significant diarrhea although CT abdomen showed evidence of mild wall thickening of the cecum concerning for inflammatory colitis.  She is also in the ICU and being treated for NSTEMI and is on heparin drip.  Chest CT Imaging: Moderate bilateral lower lobe atelectasis, increased compared to  08/08/2022.  3. Numerous enlarged mediastinal and hilar lymph nodes appear  similar to prior exam and are consistent with metastatic disease.    OF NOTE: Upper Endoscopy in 2017 revealed a Hiatal Hernia then. Type of Study: Bedside Swallow Evaluation Previous Swallow Assessment: none Diet Prior to this Study: NPO ("puree foods" at home per pt) Temperature Spikes Noted: No (wbc 8.6) Respiratory Status: Nasal cannula (2L) History of Recent Intubation: No Behavior/Cognition: Alert;Cooperative;Pleasant mood (weak appearing) Oral Cavity Assessment: Within Functional Limits Oral Care Completed by SLP: Yes Oral Cavity - Dentition: Missing dentition;Adequate natural dentition (few) Vision: Functional for self-feeding Self-Feeding Abilities: Able to feed self;Needs assist;Needs set up Patient Positioning: Upright in bed (needed positioning support d/t weakness) Baseline Vocal Quality: Normal Volitional Cough: Strong Volitional Swallow: Able to elicit    Oral/Motor/Sensory Function Overall Oral Motor/Sensory Function: Within functional limits   Ice Chips Ice chips: Within functional limits Presentation: Spoon (fed; 2 trials)    Thin Liquid Thin Liquid: Within functional limits Presentation: Cup;Self Fed (~4 ozs) Other Comments: water, juice    Nectar Thick Nectar Thick Liquid: Not tested   Honey Thick Honey Thick Liquid: Not tested   Puree Puree: Within functional limits Presentation: Self Fed;Spoon (supported; ~3 ozs)   Solid     Solid: Not tested Other Comments: pt declined stating solids "don't go down well"        Jerilynn Som, MS, CCC-SLP Speech Language Pathologist Rehab Services; [facility] [phone] (ascom) Phoenyx Melka 08/30/2022,4:03 PM

## 2022-08-30 NOTE — Progress Notes (Signed)
ANTICOAGULATION CONSULT NOTE  Pharmacy Consult for Iv heparin Indication: chest pain/ACS  Allergies  Allergen Reactions   Latex Rash    From Bandaids   Sulfa Antibiotics Nausea Only    Patient Measurements: Height: 5\' 5"  (165.1 cm) Weight: 80 kg (176 lb 5.9 oz) IBW/kg (Calculated) : 57 Heparin Dosing Weight: 72.9 kg  Vital Signs: Temp: 97.9 F (36.6 C) (08/19 2000) Temp Source: Oral (08/19 2000) BP: 103/52 (08/20 0500) Pulse Rate: 81 (08/20 0500)  Labs: Recent Labs    08/28/22 1424 08/28/22 1613 08/28/22 1926 08/28/22 1932 08/28/22 2148 08/29/22 0205 08/29/22 0530 08/29/22 1141 08/29/22 2316 08/30/22 0517  HGB 15.5*  --   --   --   --   --  13.7  --   --  13.2  HCT 47.1*  --   --   --   --   --  42.1  --   --  39.2  PLT 300  --   --   --   --   --  254  --   --  231  APTT  --   --  36  --   --   --   --   --   --   --   LABPROT  --   --  17.8*  --   --   --   --   --   --   --   INR  --   --  1.4*  --   --   --   --   --   --   --   HEPARINUNFRC  --   --   --   --   --    < >  --  0.10* 0.63 0.65  CREATININE 0.73  --   --   --   --   --   --   --   --   --   TROPONINIHS 319* 333*  --  359* 381*  --   --   --   --   --    < > = values in this interval not displayed.    Estimated Creatinine Clearance: 61.5 mL/min (by C-G formula based on SCr of 0.73 mg/dL).   Medical History: 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HCC)    GERD (gastroesophageal reflux disease)    Hyperlipidemia    Hypothyroidism    Insomnia     Medications:  No prior to admission anticoagulation  Assessment: 77 year old female being admitted with ACS. Pharmacy has been consulted to manage IV heparin.  Baseline labs: INR 1.4, aPTT 36s, H&H/PLT wnl  Goal of Therapy:  Heparin level 0.3-0.7 units/ml Monitor platelets by anticoagulation protocol: Yes   Plan: Heparin level therapeutic x 2 - Will continue heparin infusion rate at 1300 units/hr - Will  recheck HL w/ AM labs daily while therapeutic - CBC daily while on heparin  Otelia Sergeant, PharmD, [facility] 08/30/2022 5:38 AM

## 2022-08-30 NOTE — Progress Notes (Signed)
   08/30/22 1200  Spiritual Encounters  Type of Visit Initial  Care provided to: Patient  Referral source Nurse (RN/NT/LPN)  Reason for visit Urgent spiritual support  OnCall Visit Yes  Spiritual Framework  Presenting Themes Significant life change  Patient Stress Factors Family relationships;Loss  Interventions  Spiritual Care Interventions Made Established relationship of care and support;Prayer   Chaplain received referral from nurse to visit patient who is walking through significant health changes and is also responsible to give care to family members.

## 2022-08-30 NOTE — Consult Note (Signed)
Consultation Note Date: 08/30/2022   Patient Name: Christine Savage  DOB: 1945/10/29  MRN: [id number]  Age / Sex: 77 y.o., female  PCP: Christine Goodell, MD Referring Physician: Leeroy Bock, MD  Reason for Consultation: Establishing goals of care  HPI/Patient Profile: 77 y.o. female  admitted on 08/28/2022 with  medical history significant of  arthritis, GERD, hyperlipidemia, Hypothyroidism  H/O ocular melanoma with liver metastasis.  Per Dr. Rao/oncologist She is s/p 3 cycles of ipilimumab plus nivolumab immunotherapy with last cycle given on 08/10/2022.  Scans subsequently showed evidence of disease progression in the liver as well as progressive mediastinal adenopathy.    She was seen by Dr. Harold Savage at [facility] and plan was to proceed with Tebentafusp, concern is that secondary to her poor performance status this may not be an option.  Her over overall prognosis is poor  Currently she is admitted for non-STEMI, she is on a heparin drip  Patient and family face treatment option decisions,advanced directive decisions and anticipatory care needs.   Clinical Assessment and Goals of Care:  This NP Christine Savage reviewed medical records, received report from team, assessed the patient and then meet at the patient's bedside  to discuss diagnosis, prognosis, GOC, EOL wishes disposition and options.   Concept of Palliative Care was introduced as specialized medical care for people and their families living with serious illness.  If focuses on providing relief from the symptoms and stress of a serious illness.  The goal is to improve quality of life for both the patient and the family.  Values and goals of care important to patient and family were attempted to be elicited.  Created space and opportunity for patient  to explore thoughts and feelings regarding current medical situation.   [name] is tearful as she shares  the difficulties of living with serious life limiting illness..  Patient understands the seriousness and the likely associated poor prognosis.   She shares her concerns for her husband's continued cognitive decline.  She shares personal current life experiences that are adding to her emotional burden. Therapeutic listening and emotional support.   A  discussion was had today regarding advanced directives.  Concepts specific to code status, artifical feeding and hydration, continued IV antibiotics and rehospitalization was had.    The difference between a aggressive medical intervention path  and a palliative comfort care path for this patient at this time was had.   Education offered on hospice benefit; philosophy and eligibility  At this time patient is open to all offered and available medical interventions to prolong life.       Questions and concerns addressed.  Patient  encouraged to call with questions or concerns.     Later in the afternoon I was able to speak to her son Christine Savage POA by telephone and education offered again on all the above topics concepts.  I encouraged an in person family meeting and he agrees to meet me tomorrow 08-30-22  at noon   PMT will continue  to support holistically.             HCPOA/Jimmy Trusty/son    SUMMARY OF RECOMMENDATIONS    Code Status/Advance Care Planning: Full code Educated patient/family to consider DNR/DNI status understanding evidenced based poor outcomes in similar hospitalized patient, as the cause of arrest is likely associated with advanced chronic illness rather than an easily reversible acute cardio-pulmonary event.     Palliative Prophylaxis:  Aspiration, Bowel Regimen, Delirium Protocol, Frequent Pain Assessment, and Oral Care  Additional Recommendations (Limitations, Scope, Preferences): Full Scope Treatment  Psycho-social/Spiritual:  Desire for further Chaplaincy support:yes Additional Recommendations:  Education on Hospice  Prognosis:  Unable to determine  Discharge Planning: To Be Determined      Primary Diagnoses: Present on Admission:  NSTEMI (non-ST elevated myocardial infarction) (HCC)   I have reviewed the medical record, interviewed the patient and family, and examined the patient. The following aspects are pertinent.  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HCC)    GERD (gastroesophageal reflux disease)    Hyperlipidemia    Hypothyroidism    Insomnia    Social History   Socioeconomic History   Marital status: Married    Spouse name: Not on file   Number of children: Not on file   Years of education: Not on file   Highest education level: Not on file  Occupational History   Not on file  Tobacco Use   Smoking status: Never   Smokeless tobacco: Never  Vaping Use   Vaping status: Never Used  Substance and Sexual Activity   Alcohol use: No   Drug use: No   Sexual activity: Not Currently    Birth control/protection: None  Other Topics Concern   Not on file  Social History Narrative   Not on file   Social Determinants of Health   Financial Resource Strain: Not on file  Food Insecurity: No Food Insecurity (08/29/2022)   Hunger Vital Sign    Worried About Running Out of Food in the Last Year: Never true    Ran Out of Food in the Last Year: Never true  Transportation Needs: No Transportation Needs (08/29/2022)   PRAPARE - Administrator, Civil Service (Medical): No    Lack of Transportation (Non-Medical): No  Physical Activity: Not on file  Stress: Not on file  Social Connections: Not on file   Family History  Problem Relation Age of Onset   Coronary artery disease Mother    Cancer Father    Coronary artery disease Father    Osteoarthritis Brother    Cancer Brother    Scheduled Meds:  aspirin EC  81 mg Oral Daily   Chlorhexidine Gluconate Cloth  6 each Topical QHS   levothyroxine  50 mcg Oral Q0600    metoprolol tartrate  12.5 mg Oral BID   pantoprazole (PROTONIX) IV  40 mg Intravenous Q24H   Continuous Infusions:  cefTRIAXone (ROCEPHIN)  IV 200 mL/hr at 08/30/22 0500   metronidazole 100 mL/hr at 08/30/22 0500   PRN Meds:.acetaminophen, nitroGLYCERIN, ondansetron (ZOFRAN) IV Medications Prior to Admission:  Prior to Admission medications   Medication Sig Start Date End Date Taking? Authorizing Provider  Calcium Carbonate-Vitamin D 600-400 MG-UNIT tablet Take by mouth.   Yes [provider]  Cyanocobalamin 1000 MCG TBCR Take by mouth.   Yes [provider]  diphenhydrAMINE (BENADRYL) 25 mg capsule Take by mouth.   Yes [provider]  docusate  sodium (COLACE) 100 MG capsule Take 100 mg by mouth.   Yes [provider]  levothyroxine (SYNTHROID, LEVOTHROID) 50 MCG tablet TAKE 1 TABLET (50 MCG TOTAL) BY MOUTH ONCE DAILY. 06/29/15  Yes [provider]  lidocaine-prilocaine (EMLA) cream Apply to affected area once 06/14/22  Yes Creig Hines, MD  loperamide (IMODIUM A-D) 2 MG tablet Take 2 mg by mouth 4 (four) times daily as needed for diarrhea or loose stools.   Yes [provider]  Omega-3 Fatty Acids (FISH OIL PO) Take by mouth.   Yes [provider]  ondansetron (ZOFRAN) 8 MG tablet Take 1 tablet (8 mg total) by mouth every 8 (eight) hours as needed for nausea or vomiting. 06/14/22  Yes Creig Hines, MD  pantoprazole (PROTONIX) 40 MG tablet Take 40 mg by mouth 2 (two) times daily. 12/20/20  Yes [provider]  prochlorperazine (COMPAZINE) 10 MG tablet Take 1 tablet (10 mg total) by mouth every 6 (six) hours as needed for nausea or vomiting. 06/14/22  Yes Creig Hines, MD  sodium chloride (OCEAN) 0.65 % nasal spray Place into the nose.   Yes [provider]  pravastatin (PRAVACHOL) 20 MG tablet Take by mouth. 06/02/15 08/10/22  [provider]   Allergies  Allergen Reactions   Latex Rash    From  Bandaids   Sulfa Antibiotics Nausea Only   Review of Systems  Neurological:  Positive for weakness.    Physical Exam Cardiovascular:     Rate and Rhythm: Normal rate.  Pulmonary:     Effort: Pulmonary effort is normal.  Skin:    General: Skin is warm and dry.  Neurological:     Mental Status: She is alert and oriented to person, place, and time.     Vital Signs: BP 112/64   Pulse 93   Temp 98.3 F (36.8 C) (Oral)   Resp 19   Ht 5\' 5"  (1.651 m)   Wt 80 kg   SpO2 97%   BMI 29.35 kg/m  Pain Scale: 0-10   Pain Score: 0-No pain   SpO2: SpO2: 97 % O2 Device:SpO2: 97 % O2 Flow Rate: .O2 Flow Rate (L/min): 2 L/min  IO: Intake/output summary:  Intake/Output Summary (Last 24 hours) at 08/30/2022 1041 Last data filed at 08/30/2022 0401 Gross per 24 hour  Intake 992.58 ml  Output --  Net 992.58 ml    LBM: Last BM Date : 08/29/22 Baseline Weight: Weight: 76.7 kg Most recent weight: Weight: 80 kg     Palliative Assessment/Data: 40 %    Time:  90 minutes    Signed by: Christine Creed, NP   Please contact Palliative Medicine Team phone at [phone] for questions and concerns.  For individual provider: See Loretha Stapler

## 2022-08-30 NOTE — Progress Notes (Signed)
PROGRESS NOTE  Christine Savage    DOB: 07-Nov-1945, 77 y.o.  GNF:[id number]    Code Status: Full Code   DOA: 08/28/2022   LOS: 2   Brief hospital course  Christine Savage is a 77 y.o. 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  They presented from home to the ED on 08/28/2022 with SOB, abdominal pain, diarrhea x 2 weeks. Also has had progressively worsened swelling of LEs.   In the ED, it was found that they had Afeb, bp 119/82, HR104, rr 16, sat 94% on ra.  Significant findings included Wbc 10.6, hgb 15.5,plt 300. BNP 41.7 Na 135, K 3.8, cl 101, cr 0.73. Trop 319, 333. Lipase 35. Ast 164,alkphos 160 EKG: sinus tachycardia 102 Cxr- Hypoinflation with minimal bibasilar linear density left worse than right likely atelectasis.   Ctabd/pelvis 1. Mild wall thickening of the cecum with trace surrounding inflammatory stranding worrisome for infectious or inflammatory colitis. 2. Small amount of free fluid in the lower abdomen and pelvis. 3. Dominant hepatic lesion appears grossly unchanged. The liver is diffusely heterogeneous and additional small nodular densities can not be excluded on this study. Progression of metastatic disease is not excluded. Consider further evaluation with MRI   CTPE 1. No evidence of pulmonary embolism. 2. Moderate bilateral lower lobe atelectasis, increased compared to 08/08/2022. 3. Numerous enlarged mediastinal and hilar lymph nodes appear similar to prior exam and are consistent with metastatic disease.  They were initially treated with heparin gtt. Cardiology was consulted.   Patient was admitted to medicine service for further workup and management of NSTEMI, colitis as outlined in detail below.  08/30/22 -stable  Assessment & Plan  Principal Problem:   NSTEMI (non-ST elevated myocardial infarction) (HCC) Active Problems:   Colitis  NSTEMI- currently no chest pain or SOB at  rest. Echo was unremarkable.  - discontinue heparin gtt - cardiology following- suspects demand ischemia and does not plan any further ischemic workup at this time.  -continue asa,bb, statin    Colitis- infectious vs autoimmune. Endorses abdominal tenderness and dark, formed stool this am. Symptoms have improved.  - consult GI to evaluate for autoimmune colitis given recent immunotherapy treatment.  -gi and cdiff testing pending  - continue ctx/metronidazole  - supportive care   Metastatic choroidal melanoma with liver metastases- s/p 3 cycles of ipilimumab and nivolumab immunotherapy. New liver nodules noted on presentation with elevated liver enzymes. States that she is not sure she has the energy to continue to try treatments and also states she's not ready for palliative care. She is willing to talk with palliative care for GOC discussions.  - consulted heme /onc. Dr. Smith Robert  - "She does not have any meaningful treatment options here at [facility] and therefore she was referred to [facility] for consideration for Tebentafusp. 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 maybe some pressure on the upper Esophagus? plain old Esophageal phase narrowing, presbyesophagus? she endorses Regurgitation and frothy Phlegm; food "stopping" in her chest and "coming back up". "  - recommends GI consult which was already placed for colitis. Will ask Dr. Mia Creek to assess this as well.    GERD -ppi    Hypothyroidism  -resume supplement   Hyperlipidemia -hold  statin due to elevated lfts  Body mass index is 29.35 kg/m.  VTE ppx:   Diet:     Diet   DIET - DYS 1 Room service appropriate? Yes with Assist; Fluid consistency: Thin    Consultants: Cardiology Oncology Palliative   Subjective 08/30/22    Pt reports feeling improved since admission. Denies SOB or chest pain currently. Had a formed BM today. Improved abdominal pain   Objective   Vitals:   08/30/22 1024 08/30/22 1100 08/30/22 1114 08/30/22 1300  BP: 112/64 122/81  104/60  Pulse: 93 82  77  Resp:  17  18  Temp:   98 F (36.7 C) 97.6 F (36.4 C)  TempSrc:   Oral   SpO2:  97%  98%  Weight:      Height:        Intake/Output Summary (Last 24 hours) at 08/30/2022 1352 Last data filed at 08/30/2022 1200 Gross per 24 hour  Intake 949.85 ml  Output --  Net 949.85 ml   Filed Weights   08/28/22 1422 08/29/22 0750 08/30/22 0358  Weight: 76.7 kg 76.7 kg 80 kg     Physical Exam:  General: awake, alert, NAD HEENT: atraumatic, clear conjunctiva, anicteric sclera, MMM, hearing grossly normal Respiratory: normal respiratory effort. CTAB Cardiovascular: quick capillary refill, normal S1/S2, RRR, no JVD. Systolic murmurs Gastrointestinal: soft, diffuse tenderness. No rebound, guarding, masses Nervous: A&O x3. no gross focal neurologic deficits, normal speech Extremities: moves all equally, no edema, normal tone Skin: dry, intact, normal temperature, normal color. No rashes, lesions or ulcers on exposed skin Psychiatry: normal mood, congruent affect  Labs   I have personally reviewed the following labs and imaging studies CBC    Component Value Date/Time   WBC 8.6 08/30/2022 0517   RBC 4.25 08/30/2022 0517   HGB 13.2 08/30/2022 0517   HGB 14.3 08/10/2022 0907   HCT 39.2 08/30/2022 0517   PLT 231 08/30/2022 0517   PLT 240 08/10/2022 0907   MCV 92.2 08/30/2022 0517   MCH 31.1 08/30/2022 0517   MCHC 33.7 08/30/2022 0517   RDW 15.5 08/30/2022 0517   LYMPHSABS 1.2 08/10/2022 0907   MONOABS 1.0 08/10/2022 0907   EOSABS 0.3 08/10/2022 0907   BASOSABS 0.1 08/10/2022 0907      Latest Ref Rng & Units 08/30/2022    5:17 AM 08/28/2022    2:24 PM  08/10/2022    9:07 AM  BMP  Glucose 70 - 99 mg/dL 962  952  841   BUN 8 - 23 mg/dL 11  10  13    Creatinine 0.44 - 1.00 mg/dL 3.24  4.01  0.27   Sodium 135 - 145 mmol/L 136  135  133   Potassium 3.5 - 5.1 mmol/L 3.7  3.8  3.8   Chloride 98 - 111 mmol/L 106  101  102   CO2 22 - 32 mmol/L 25  25  27    Calcium 8.9 - 10.3 mg/dL 7.5  8.3  8.2     ECHOCARDIOGRAM COMPLETE  Result Date: 08/29/2022    ECHOCARDIOGRAM REPORT   Patient Name:   Christine Savage Date of Exam: 08/29/2022 Medical Rec #:  [id number]     Height:       65.0 in Accession #:    [id number]    Weight:       169.0 lb Date of Birth:  April 19, 1945     BSA:          1.841 m Patient  Age:    77 years      BP:           108/82 mmHg Patient Gender: F             HR:           76 bpm. Exam Location:  [facility] Procedure: 2D Echo, Cardiac Doppler, Color Doppler and Strain Analysis Indications:     NSTEMI  History:         Patient has no prior history of Echocardiogram examinations.                  Acute MI; Risk Factors:Dyslipidemia. Melonoma.  Sonographer:     Mikki Harbor Referring Phys:  [id number] SARA-MAIZ A THOMAS Diagnosing Phys: Lorine Bears MD  Sonographer Comments: Global longitudinal strain was attempted. IMPRESSIONS  1. Left ventricular ejection fraction, by estimation, is 55 to 60%. The left ventricle has normal function. The left ventricle has no regional wall motion abnormalities. There is mild left ventricular hypertrophy. Left ventricular diastolic parameters were normal. The average left ventricular global longitudinal strain is -20.8 %. The global longitudinal strain is normal.  2. Right ventricular systolic function is normal. The right ventricular size is normal. There is normal pulmonary artery systolic pressure.  3. The mitral valve is normal in structure. Mild mitral valve regurgitation. No evidence of mitral stenosis.  4. The aortic valve is normal in structure. Aortic valve regurgitation is not visualized. No aortic stenosis is  present.  5. The inferior vena cava is normal in size with greater than 50% respiratory variability, suggesting right atrial pressure of 3 mmHg. FINDINGS  Left Ventricle: Left ventricular ejection fraction, by estimation, is 55 to 60%. The left ventricle has normal function. The left ventricle has no regional wall motion abnormalities. The average left ventricular global longitudinal strain is -20.8 %. The global longitudinal strain is normal. The left ventricular internal cavity size was normal in size. There is mild left ventricular hypertrophy. Left ventricular diastolic parameters were normal. Right Ventricle: The right ventricular size is normal. No increase in right ventricular wall thickness. Right ventricular systolic function is normal. There is normal pulmonary artery systolic pressure. The tricuspid regurgitant velocity is 2.38 m/s, and  with an assumed right atrial pressure of 3 mmHg, the estimated right ventricular systolic pressure is 25.7 mmHg. Left Atrium: Left atrial size was normal in size. Right Atrium: Right atrial size was normal in size. Pericardium: There is no evidence of pericardial effusion. Mitral Valve: The mitral valve is normal in structure. Mild mitral valve regurgitation. No evidence of mitral valve stenosis. MV peak gradient, 4.1 mmHg. The mean mitral valve gradient is 2.0 mmHg. Tricuspid Valve: The tricuspid valve is normal in structure. Tricuspid valve regurgitation is mild . No evidence of tricuspid stenosis. Aortic Valve: The aortic valve is normal in structure. Aortic valve regurgitation is not visualized. No aortic stenosis is present. Aortic valve mean gradient measures 4.0 mmHg. Aortic valve peak gradient measures 11.0 mmHg. Aortic valve area, by VTI measures 2.87 cm. Pulmonic Valve: The pulmonic valve was normal in structure. Pulmonic valve regurgitation is not visualized. No evidence of pulmonic stenosis. Aorta: The aortic root is normal in size and structure. Venous: The  inferior vena cava is normal in size with greater than 50% respiratory variability, suggesting right atrial pressure of 3 mmHg. IAS/Shunts: No atrial level shunt detected by color flow Doppler.  LEFT VENTRICLE PLAX 2D LVIDd:         4.50 cm  Diastology LVIDs:         3.20 cm     LV e' medial:    9.79 cm/s LV PW:         1.30 cm     LV E/e' medial:  8.3 LV IVS:        1.30 cm     LV e' lateral:   12.10 cm/s LVOT diam:     2.10 cm     LV E/e' lateral: 6.7 LV SV:         96 LV SV Index:   52          2D Longitudinal Strain LVOT Area:     3.46 cm    2D Strain GLS Avg:     -20.8 %  LV Volumes (MOD) LV vol d, MOD A2C: 45.4 ml LV vol d, MOD A4C: 43.4 ml LV vol s, MOD A2C: 15.2 ml LV vol s, MOD A4C: 21.1 ml LV SV MOD A2C:     30.2 ml LV SV MOD A4C:     43.4 ml LV SV MOD BP:      29.0 ml RIGHT VENTRICLE RV Basal diam:  3.05 cm RV Mid diam:    3.00 cm RV S prime:     15.10 cm/s TAPSE (M-mode): 2.4 cm LEFT ATRIUM             Index        RIGHT ATRIUM           Index LA diam:        3.60 cm 1.95 cm/m   RA Area:     16.00 cm LA Vol (A2C):   45.0 ml 24.44 ml/m  RA Volume:   39.00 ml  21.18 ml/m LA Vol (A4C):   34.5 ml 18.73 ml/m LA Biplane Vol: 39.4 ml 21.40 ml/m  AORTIC VALVE                    PULMONIC VALVE AV Area (Vmax):    2.55 cm     PV Vmax:       0.82 m/s AV Area (Vmean):   2.73 cm     PV Peak grad:  2.7 mmHg AV Area (VTI):     2.87 cm AV Vmax:           166.00 cm/s AV Vmean:          93.900 cm/s AV VTI:            0.333 m AV Peak Grad:      11.0 mmHg AV Mean Grad:      4.0 mmHg LVOT Vmax:         122.00 cm/s LVOT Vmean:        74.000 cm/s LVOT VTI:          0.276 m LVOT/AV VTI ratio: 0.83  AORTA Ao Root diam: 3.00 cm Ao Asc diam:  3.60 cm MITRAL VALVE               TRICUSPID VALVE MV Area (PHT): 3.26 cm    TR Peak grad:   22.7 mmHg MV Area VTI:   2.99 cm    TR Vmax:        238.00 cm/s MV Peak grad:  4.1 mmHg MV Mean grad:  2.0 mmHg    SHUNTS MV Vmax:       1.01 m/s    Systemic VTI:  0.28 m MV Vmean:  69.4 cm/s   Systemic Diam: 2.10 cm MV Decel Time: 233 msec MV E velocity: 81.20 cm/s MV A velocity: 91.20 cm/s MV E/A ratio:  0.89 Lorine Bears MD Electronically signed by Lorine Bears MD Signature Date/Time: 08/29/2022/3:46:25 PM    Final    US Venous Img Lower Unilateral Left (DVT)  Result Date: 08/28/2022 CLINICAL DATA:  Pain, swelling of left lower extremity EXAM: LEFT LOWER EXTREMITY VENOUS DOPPLER ULTRASOUND TECHNIQUE: Gray-scale sonography with compression, as well as color and duplex ultrasound, were performed to evaluate the deep venous system(s) from the level of the common femoral vein through the popliteal and proximal calf veins. COMPARISON:  None Available. FINDINGS: VENOUS Normal compressibility of the common femoral, superficial femoral, and popliteal veins, as well as the visualized calf veins. Visualized portions of profunda femoral vein and great saphenous vein unremarkable. No filling defects to suggest DVT on grayscale or color Doppler imaging. Doppler waveforms show normal direction of venous flow, normal respiratory plasticity and response to augmentation. Limited views of the contralateral common femoral vein are unremarkable. OTHER None. Limitations: none IMPRESSION: Negative. Electronically Signed   By: Charlett Nose M.D.   On: 08/28/2022 22:05   CT Angio Chest PE W and/or Wo Contrast  Result Date: 08/28/2022 CLINICAL DATA:  Diarrhea and abdominal pain. Shortness of breath. Known metastatic melanoma. EXAM: CT ANGIOGRAPHY CHEST WITH CONTRAST TECHNIQUE: Multidetector CT imaging of the chest was performed using the standard protocol during bolus administration of intravenous contrast. Multiplanar CT image reconstructions and MIPs were obtained to evaluate the vascular anatomy. RADIATION DOSE REDUCTION: This exam was performed according to the departmental dose-optimization program which includes automated exposure control, adjustment of the mA and/or kV according to patient size  and/or use of iterative reconstruction technique. CONTRAST:  80mL OMNIPAQUE IOHEXOL 350 MG/ML SOLN COMPARISON:  Chest radiograph performed the same day and PET-CT dated 08/08/2022. FINDINGS: Cardiovascular: Satisfactory opacification of the pulmonary arteries to the segmental level. No evidence of pulmonary embolism. There are coronary artery calcifications. The heart is enlarged. No pericardial effusion. A right internal jugular central venous port catheter tip terminates at the superior cavoatrial junction. Mediastinum/Nodes: Numerous enlarged mediastinal and hilar lymph nodes appear similar to prior exam. For reference, a precarinal lymph node measures 1.3 cm in short axis (series 4, image 49). No enlarged axillary lymph nodes. Thyroid gland, trachea, and esophagus demonstrate no significant findings. Lungs/Pleura: There is moderate bilateral lower lobe atelectasis, increased compared to 08/08/2022. Multiple bulla are seen in the left upper lobe. No pleural effusion or pneumothorax. Upper Abdomen: The liver has a nodular surface contour, unchanged. Previously described metastatic disease in the liver is not appreciated on this exam. Musculoskeletal: Degenerative changes are seen in the spine. Review of the MIP images confirms the above findings. IMPRESSION: 1. No evidence of pulmonary embolism. 2. Moderate bilateral lower lobe atelectasis, increased compared to 08/08/2022. 3. Numerous enlarged mediastinal and hilar lymph nodes appear similar to prior exam and are consistent with metastatic disease. Electronically Signed   By: Romona Curls M.D.   On: 08/28/2022 17:58   CT ABDOMEN PELVIS W CONTRAST  Result Date: 08/28/2022 CLINICAL DATA:  Acute abdominal pain.  Metastatic melanoma. EXAM: CT ABDOMEN AND PELVIS WITH CONTRAST TECHNIQUE: Multidetector CT imaging of the abdomen and pelvis was performed using the standard protocol following bolus administration of intravenous contrast. RADIATION DOSE REDUCTION: This  exam was performed according to the departmental dose-optimization program which includes automated exposure control, adjustment of the mA and/or kV according to  patient size and/or use of iterative reconstruction technique. CONTRAST:  80mL OMNIPAQUE IOHEXOL 350 MG/ML SOLN COMPARISON:  PET-CT 08/08/2022 FINDINGS: Lower chest: There is a small amount of atelectasis in the lung bases. Hepatobiliary: Hypodense hepatic metastatic lesion in the dome measures 2.0 x 2.6 cm and appears grossly unchanged. The liver is diffusely heterogeneous and small additional nodular densities can not be excluded on this study. Progression of metastatic disease is not excluded. Nodular liver contour appears stable. Gallbladder and bile ducts are within normal limits. Pancreas: Unremarkable. No pancreatic ductal dilatation or surrounding inflammatory changes. Spleen: Normal in size without focal abnormality. Adrenals/Urinary Tract: Adrenal glands are unremarkable. Kidneys are normal, without renal calculi, focal lesion, or hydronephrosis. Bladder is unremarkable. Stomach/Bowel: There is mild wall thickening of the cecum with trace surrounding inflammatory stranding. There is no bowel obstruction, pneumatosis or free air. The appendix is within normal limits. Small bowel and stomach are within normal limits. Duodenal diverticulum is present. Vascular/Lymphatic: Aortic atherosclerosis. No enlarged abdominal or pelvic lymph nodes. Reproductive: Status post hysterectomy. No adnexal masses. Other: There is a small amount of free fluid in the lower abdomen and pelvis. Musculoskeletal: No acute or significant osseous findings. IMPRESSION: 1. Mild wall thickening of the cecum with trace surrounding inflammatory stranding worrisome for infectious or inflammatory colitis. 2. Small amount of free fluid in the lower abdomen and pelvis. 3. Dominant hepatic lesion appears grossly unchanged. The liver is diffusely heterogeneous and additional small  nodular densities can not be excluded on this study. Progression of metastatic disease is not excluded. Consider further evaluation with MRI. 4. Aortic atherosclerosis. Aortic Atherosclerosis (ICD10-I70.0). Electronically Signed   By: Darliss Cheney M.D.   On: 08/28/2022 17:48   DG Chest 2 View  Result Date: 08/28/2022 CLINICAL DATA:  Shortness of breath. EXAM: CHEST - 2 VIEW COMPARISON:  10/08/2010, PET-CT 08/08/2022 FINDINGS: Right IJ Port-A-Cath has tip over the SVC. Lungs are hypoinflated with minimal bibasilar linear density left worse than right likely atelectasis as seen on recent PET-CT. No significant effusion. Cardiomediastinal silhouette and remainder the exam is unchanged. IMPRESSION: Hypoinflation with minimal bibasilar linear density left worse than right likely atelectasis. Electronically Signed   By: Elberta Fortis M.D.   On: 08/28/2022 15:01    Disposition Plan & Communication  Patient status: Inpatient  Admitted From: Home Planned disposition location: Home Anticipated discharge date: 8/21 pending clinical improvement and GOC for dispo  Family Communication: none at bedside    Author: Leeroy Bock, DO [facility] 08/30/2022, 1:52 PM   Available by Epic secure chat 7AM-7PM. If 7PM-7AM, please contact night-coverage.  [facility] contact information found on [email or web address].

## 2022-08-30 NOTE — Consult Note (Signed)
Hematology/Oncology Consult note [facility] Telephone:([phone] [phone]  Patient Care Team: Marina Goodell, MD as PCP - General (Family Medicine) Creig Hines, MD as Consulting Physician (Oncology)   Name of the patient: Christine Savage  [id number]  1945/12/02    Reason for consult: History of metastatic melanoma admitted for NSTEMI and colitis   Requesting physician: Dr. Dareen Piano  Date of visit:08/30/2022    History of presenting illness-patient is a 77 year old female with history of metastatic choroidal melanoma with liver metastases.  She is s/p 3 cycles of ipilimumab plus nivolumab immunotherapy with last cycle given on 08/10/2022.  Scans subsequently showed evidence of disease progression in the liver as well as progressive mediastinal adenopathy.  She was seen by Dr. Harold Hedge at [facility] and plan was to proceed with Tebentafusp.She was admitted to the hospital for symptoms of abdominal pain shortness of breath as well as diarrhea.  Since her hospital admission she has not had any significant diarrhea although CT abdomen showed evidence of mild wall thickening of the cecum concerning for inflammatory colitis.  She is also in the ICU and being treated for NSTEMI and is on heparin drip.  ECOG PS- 3  Pain scale- 0   Review of systems- Review of Systems  Constitutional:  Positive for malaise/fatigue. Negative for chills, fever and weight loss.       Poor appetite  HENT:  Negative for congestion, ear discharge and nosebleeds.   Eyes:  Negative for blurred vision.  Respiratory:  Negative for cough, hemoptysis, sputum production, shortness of breath and wheezing.   Cardiovascular:  Negative for chest pain, palpitations, orthopnea and claudication.  Gastrointestinal:  Negative for abdominal pain, blood in stool, constipation, diarrhea, heartburn, melena, nausea and vomiting.  Genitourinary:  Negative for dysuria, flank pain, frequency, hematuria and  urgency.  Musculoskeletal:  Negative for back pain, joint pain and myalgias.  Skin:  Negative for rash.  Neurological:  Negative for dizziness, tingling, focal weakness, seizures, weakness and headaches.  Endo/Heme/Allergies:  Does not bruise/bleed easily.  Psychiatric/Behavioral:  Negative for depression and suicidal ideas. The patient does not have insomnia.     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   Eye cancer (HCC) 06/08/2022   History of colonic polyps    Change in bowel habit    Polyp of transverse colon    Special screening for malignant neoplasms, colon    Benign neoplasm of descending colon    Dysphagia    Mild dietary indigestion    Hypothyroidism    History of recurrent UTIs 06/15/2015   Primary osteoarthritis involving multiple joints 06/15/2015   PMR (polymyalgia rheumatica) (HCC) 12/24/2014   Acute pain of left shoulder 12/16/2014   Arthralgia of both hands 12/16/2014   Arthritis 12/16/2014   Chronic bilateral low back pain without sciatica 12/16/2014   Chronic neck pain 12/16/2014   Dyslipidemia 12/16/2014   Gastroesophageal reflux disease without esophagitis 12/16/2014   Osteoarthritis 12/16/2014   Weakness generalized 12/16/2014   Hypercholesterolemia 12/13/2011   Insomnia 12/13/2011     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HCC)    GERD (gastroesophageal  reflux disease)    Hyperlipidemia    Hypothyroidism    Insomnia      Past Surgical History:  Procedure Laterality Date   ABDOMINAL HYSTERECTOMY     CATARACT EXTRACTION W/ INTRAOCULAR LENS  IMPLANT, BILATERAL     COLONOSCOPY WITH PROPOFOL N/A 12/17/2015   Procedure:  COLONOSCOPY WITH PROPOFOL;  Surgeon: Midge Minium, MD;  Location: [facility];  Service: Endoscopy;  Laterality: N/A;   COLONOSCOPY WITH PROPOFOL N/A 02/23/2021   Procedure: COLONOSCOPY WITH PROPOFOL;  Surgeon: Midge Minium, MD;  Location: [facility] ENDOSCOPY;  Service: Endoscopy;  Laterality: N/A;   COLONOSCOPY WITH PROPOFOL N/A 07/20/2021   Procedure: COLONOSCOPY WITH PROPOFOL;  Surgeon: Midge Minium, MD;  Location: [facility] ENDOSCOPY;  Service: Endoscopy;  Laterality: N/A;   ESOPHAGOGASTRODUODENOSCOPY (EGD) WITH PROPOFOL N/A 12/17/2015   Procedure: ESOPHAGOGASTRODUODENOSCOPY (EGD) WITH PROPOFOL;  Surgeon: Midge Minium, MD;  Location: [facility];  Service: Endoscopy;  Laterality: N/A;  Latex sensitivity   EYE SURGERY     IR IMAGING GUIDED PORT INSERTION  07/15/2022   NASAL SINUS SURGERY     POLYPECTOMY N/A 12/17/2015   Procedure: POLYPECTOMY;  Surgeon: Midge Minium, MD;  Location: [facility];  Service: Endoscopy;  Laterality: N/A;   ROOT CANAL     TUBAL LIGATION      Social History   Socioeconomic History   Marital status: Married    Spouse name: Not on file   Number of children: Not on file   Years of education: Not on file   Highest education level: Not on file  Occupational History   Not on file  Tobacco Use   Smoking status: Never   Smokeless tobacco: Never  Vaping Use   Vaping status: Never Used  Substance and Sexual Activity   Alcohol use: No   Drug use: No   Sexual activity: Not Currently    Birth control/protection: None  Other Topics Concern   Not on file  Social History Narrative   Not on file   Social Determinants of Health   Financial Resource Strain: Not on file  Food Insecurity: No Food Insecurity (08/29/2022)   Hunger Vital Sign    Worried About Running Out of Food in the Last Year: Never true    Ran Out of Food in the Last Year: Never true  Transportation Needs: No Transportation Needs (08/29/2022)   PRAPARE - Scientist, research (physical sciences) (Medical): No    Lack of Transportation (Non-Medical): No  Physical Activity: Not on file  Stress: Not on file  Social Connections: Not on file  Intimate Partner Violence: Not At Risk (08/29/2022)   Humiliation, Afraid, Rape, and Kick questionnaire    Fear of Current or Ex-Partner: No    Emotionally Abused: No    Physically Abused: No    Sexually Abused: No     Family History  Problem Relation Age of Onset   Coronary artery disease Mother    Cancer Father    Coronary artery disease Father    Osteoarthritis Brother    Cancer Brother      Current Facility-Administered Medications:    acetaminophen (TYLENOL) tablet 650 mg, 650 mg, Oral, Q4H PRN, Lurline Del, MD   [START ON 08/31/2022] aspirin chewable tablet 81 mg, 81 mg, Oral, Daily, Lowella Bandy, RPH   cefTRIAXone (ROCEPHIN) 1 g in sodium chloride 0.9 % 100 mL IVPB, 1 g, Intravenous, Q24H, Skip Mayer A, MD, Last Rate: 200 mL/hr at 08/30/22 0500, Infusion  Verify at 08/30/22 0500   Chlorhexidine Gluconate Cloth 2 % PADS 6 each, 6 each, Topical, QHS, Lurline Del, MD, 6 each at 08/29/22 0855   feeding supplement (ENSURE ENLIVE / ENSURE PLUS) liquid 237 mL, 237 mL, Oral, BID BM, Leeroy Bock, MD   levothyroxine (SYNTHROID) tablet 50 mcg, 50 mcg, Oral, Q0600, Lowella Bandy, RPH   metoprolol tartrate (LOPRESSOR) tablet 12.5 mg, 12.5 mg, Oral, BID, Skip Mayer A, MD, 12.5 mg at 08/30/22 1024   metroNIDAZOLE (FLAGYL) IVPB 500 mg, 500 mg, Intravenous, Q12H, Skip Mayer A, MD, Last Rate: 100 mL/hr at 08/30/22 0500, Infusion Verify at 08/30/22 0500   [START ON 08/31/2022] multivitamin with minerals tablet 1 tablet, 1 tablet, Oral, Daily, Leeroy Bock, MD   nitroGLYCERIN (NITROSTAT) SL tablet 0.4 mg, 0.4 mg, Sublingual, Q5 Min x 3 PRN, Skip Mayer A, MD   ondansetron [facility]) injection 4 mg, 4 mg, Intravenous, Q6H PRN, Skip Mayer A, MD   pantoprazole (PROTONIX)  injection 40 mg, 40 mg, Intravenous, Q24H, Skip Mayer A, MD, 40 mg at 08/29/22 2005   Physical exam:  Vitals:   08/30/22 1000 08/30/22 1024 08/30/22 1100 08/30/22 1114  BP: 112/64 112/64 122/81   Pulse: 88 93 82   Resp: 18  17   Temp:    98 F (36.7 C)  TempSrc:    Oral  SpO2: 95%  97%   Weight:      Height:       Physical Exam Constitutional:      Comments: Appears fatigued.  In no acute distress  Cardiovascular:     Rate and Rhythm: Normal rate and regular rhythm.     Heart sounds: Normal heart sounds.  Pulmonary:     Effort: Pulmonary effort is normal.     Breath sounds: Normal breath sounds.  Abdominal:     General: Bowel sounds are normal. There is no distension.     Palpations: Abdomen is soft.     Tenderness: There is no abdominal tenderness.  Skin:    General: Skin is warm and dry.  Neurological:     Mental Status: She is alert and oriented to person, place, and time.           Latest Ref Rng & Units 08/30/2022    5:17 AM  CMP  Glucose 70 - 99 mg/dL 811   BUN 8 - 23 mg/dL 11   Creatinine 9.14 - 1.00 mg/dL 7.82   Sodium 956 - 213 mmol/L 136   Potassium 3.5 - 5.1 mmol/L 3.7   Chloride 98 - 111 mmol/L 106   CO2 22 - 32 mmol/L 25   Calcium 8.9 - 10.3 mg/dL 7.5   Total Protein 6.5 - 8.1 g/dL 4.2   Total Bilirubin 0.3 - 1.2 mg/dL 1.4   Alkaline Phos 38 - 126 U/L 123   AST 15 - 41 U/L 104   ALT 0 - 44 U/L 74       Latest Ref Rng & Units 08/30/2022    5:17 AM  CBC  WBC 4.0 - 10.5 K/uL 8.6   Hemoglobin 12.0 - 15.0 g/dL 08.6   Hematocrit 57.8 - 46.0 % 39.2   Platelets 150 - 400 K/uL 231     @IMAGES @  ECHOCARDIOGRAM COMPLETE  Result Date: 08/29/2022    ECHOCARDIOGRAM REPORT   Patient Name:   Littie Deeds Date of Exam: 08/29/2022 Medical Rec #:  [id number]     Height:  65.0 in Accession #:    [id number]    Weight:       169.0 lb Date of Birth:  December 20, 1945     BSA:          1.841 m Patient Age:    77 years      BP:           108/82 mmHg  Patient Gender: F             HR:           76 bpm. Exam Location:  [facility] Procedure: 2D Echo, Cardiac Doppler, Color Doppler and Strain Analysis Indications:     NSTEMI  History:         Patient has no prior history of Echocardiogram examinations.                  Acute MI; Risk Factors:Dyslipidemia. Melonoma.  Sonographer:     Mikki Harbor Referring Phys:  [id number] SARA-MAIZ A THOMAS Diagnosing Phys: Lorine Bears MD  Sonographer Comments: Global longitudinal strain was attempted. IMPRESSIONS  1. Left ventricular ejection fraction, by estimation, is 55 to 60%. The left ventricle has normal function. The left ventricle has no regional wall motion abnormalities. There is mild left ventricular hypertrophy. Left ventricular diastolic parameters were normal. The average left ventricular global longitudinal strain is -20.8 %. The global longitudinal strain is normal.  2. Right ventricular systolic function is normal. The right ventricular size is normal. There is normal pulmonary artery systolic pressure.  3. The mitral valve is normal in structure. Mild mitral valve regurgitation. No evidence of mitral stenosis.  4. The aortic valve is normal in structure. Aortic valve regurgitation is not visualized. No aortic stenosis is present.  5. The inferior vena cava is normal in size with greater than 50% respiratory variability, suggesting right atrial pressure of 3 mmHg. FINDINGS  Left Ventricle: Left ventricular ejection fraction, by estimation, is 55 to 60%. The left ventricle has normal function. The left ventricle has no regional wall motion abnormalities. The average left ventricular global longitudinal strain is -20.8 %. The global longitudinal strain is normal. The left ventricular internal cavity size was normal in size. There is mild left ventricular hypertrophy. Left ventricular diastolic parameters were normal. Right Ventricle: The right ventricular size is normal. No increase in right ventricular wall  thickness. Right ventricular systolic function is normal. There is normal pulmonary artery systolic pressure. The tricuspid regurgitant velocity is 2.38 m/s, and  with an assumed right atrial pressure of 3 mmHg, the estimated right ventricular systolic pressure is 25.7 mmHg. Left Atrium: Left atrial size was normal in size. Right Atrium: Right atrial size was normal in size. Pericardium: There is no evidence of pericardial effusion. Mitral Valve: The mitral valve is normal in structure. Mild mitral valve regurgitation. No evidence of mitral valve stenosis. MV peak gradient, 4.1 mmHg. The mean mitral valve gradient is 2.0 mmHg. Tricuspid Valve: The tricuspid valve is normal in structure. Tricuspid valve regurgitation is mild . No evidence of tricuspid stenosis. Aortic Valve: The aortic valve is normal in structure. Aortic valve regurgitation is not visualized. No aortic stenosis is present. Aortic valve mean gradient measures 4.0 mmHg. Aortic valve peak gradient measures 11.0 mmHg. Aortic valve area, by VTI measures 2.87 cm. Pulmonic Valve: The pulmonic valve was normal in structure. Pulmonic valve regurgitation is not visualized. No evidence of pulmonic stenosis. Aorta: The aortic root is normal in size and structure. Venous: The inferior vena cava is  normal in size with greater than 50% respiratory variability, suggesting right atrial pressure of 3 mmHg. IAS/Shunts: No atrial level shunt detected by color flow Doppler.  LEFT VENTRICLE PLAX 2D LVIDd:         4.50 cm     Diastology LVIDs:         3.20 cm     LV e' medial:    9.79 cm/s LV PW:         1.30 cm     LV E/e' medial:  8.3 LV IVS:        1.30 cm     LV e' lateral:   12.10 cm/s LVOT diam:     2.10 cm     LV E/e' lateral: 6.7 LV SV:         96 LV SV Index:   52          2D Longitudinal Strain LVOT Area:     3.46 cm    2D Strain GLS Avg:     -20.8 %  LV Volumes (MOD) LV vol d, MOD A2C: 45.4 ml LV vol d, MOD A4C: 43.4 ml LV vol s, MOD A2C: 15.2 ml LV vol s,  MOD A4C: 21.1 ml LV SV MOD A2C:     30.2 ml LV SV MOD A4C:     43.4 ml LV SV MOD BP:      29.0 ml RIGHT VENTRICLE RV Basal diam:  3.05 cm RV Mid diam:    3.00 cm RV S prime:     15.10 cm/s TAPSE (M-mode): 2.4 cm LEFT ATRIUM             Index        RIGHT ATRIUM           Index LA diam:        3.60 cm 1.95 cm/m   RA Area:     16.00 cm LA Vol (A2C):   45.0 ml 24.44 ml/m  RA Volume:   39.00 ml  21.18 ml/m LA Vol (A4C):   34.5 ml 18.73 ml/m LA Biplane Vol: 39.4 ml 21.40 ml/m  AORTIC VALVE                    PULMONIC VALVE AV Area (Vmax):    2.55 cm     PV Vmax:       0.82 m/s AV Area (Vmean):   2.73 cm     PV Peak grad:  2.7 mmHg AV Area (VTI):     2.87 cm AV Vmax:           166.00 cm/s AV Vmean:          93.900 cm/s AV VTI:            0.333 m AV Peak Grad:      11.0 mmHg AV Mean Grad:      4.0 mmHg LVOT Vmax:         122.00 cm/s LVOT Vmean:        74.000 cm/s LVOT VTI:          0.276 m LVOT/AV VTI ratio: 0.83  AORTA Ao Root diam: 3.00 cm Ao Asc diam:  3.60 cm MITRAL VALVE               TRICUSPID VALVE MV Area (PHT): 3.26 cm    TR Peak grad:   22.7 mmHg MV Area VTI:   2.99 cm    TR Vmax:  238.00 cm/s MV Peak grad:  4.1 mmHg MV Mean grad:  2.0 mmHg    SHUNTS MV Vmax:       1.01 m/s    Systemic VTI:  0.28 m MV Vmean:      69.4 cm/s   Systemic Diam: 2.10 cm MV Decel Time: 233 msec MV E velocity: 81.20 cm/s MV A velocity: 91.20 cm/s MV E/A ratio:  0.89 Lorine Bears MD Electronically signed by Lorine Bears MD Signature Date/Time: 08/29/2022/3:46:25 PM    Final    US Venous Img Lower Unilateral Left (DVT)  Result Date: 08/28/2022 CLINICAL DATA:  Pain, swelling of left lower extremity EXAM: LEFT LOWER EXTREMITY VENOUS DOPPLER ULTRASOUND TECHNIQUE: Gray-scale sonography with compression, as well as color and duplex ultrasound, were performed to evaluate the deep venous system(s) from the level of the common femoral vein through the popliteal and proximal calf veins. COMPARISON:  None Available. FINDINGS:  VENOUS Normal compressibility of the common femoral, superficial femoral, and popliteal veins, as well as the visualized calf veins. Visualized portions of profunda femoral vein and great saphenous vein unremarkable. No filling defects to suggest DVT on grayscale or color Doppler imaging. Doppler waveforms show normal direction of venous flow, normal respiratory plasticity and response to augmentation. Limited views of the contralateral common femoral vein are unremarkable. OTHER None. Limitations: none IMPRESSION: Negative. Electronically Signed   By: Charlett Nose M.D.   On: 08/28/2022 22:05   CT Angio Chest PE W and/or Wo Contrast  Result Date: 08/28/2022 CLINICAL DATA:  Diarrhea and abdominal pain. Shortness of breath. Known metastatic melanoma. EXAM: CT ANGIOGRAPHY CHEST WITH CONTRAST TECHNIQUE: Multidetector CT imaging of the chest was performed using the standard protocol during bolus administration of intravenous contrast. Multiplanar CT image reconstructions and MIPs were obtained to evaluate the vascular anatomy. RADIATION DOSE REDUCTION: This exam was performed according to the departmental dose-optimization program which includes automated exposure control, adjustment of the mA and/or kV according to patient size and/or use of iterative reconstruction technique. CONTRAST:  80mL OMNIPAQUE IOHEXOL 350 MG/ML SOLN COMPARISON:  Chest radiograph performed the same day and PET-CT dated 08/08/2022. FINDINGS: Cardiovascular: Satisfactory opacification of the pulmonary arteries to the segmental level. No evidence of pulmonary embolism. There are coronary artery calcifications. The heart is enlarged. No pericardial effusion. A right internal jugular central venous port catheter tip terminates at the superior cavoatrial junction. Mediastinum/Nodes: Numerous enlarged mediastinal and hilar lymph nodes appear similar to prior exam. For reference, a precarinal lymph node measures 1.3 cm in short axis (series 4, image  49). No enlarged axillary lymph nodes. Thyroid gland, trachea, and esophagus demonstrate no significant findings. Lungs/Pleura: There is moderate bilateral lower lobe atelectasis, increased compared to 08/08/2022. Multiple bulla are seen in the left upper lobe. No pleural effusion or pneumothorax. Upper Abdomen: The liver has a nodular surface contour, unchanged. Previously described metastatic disease in the liver is not appreciated on this exam. Musculoskeletal: Degenerative changes are seen in the spine. Review of the MIP images confirms the above findings. IMPRESSION: 1. No evidence of pulmonary embolism. 2. Moderate bilateral lower lobe atelectasis, increased compared to 08/08/2022. 3. Numerous enlarged mediastinal and hilar lymph nodes appear similar to prior exam and are consistent with metastatic disease. Electronically Signed   By: Romona Curls M.D.   On: 08/28/2022 17:58   CT ABDOMEN PELVIS W CONTRAST  Result Date: 08/28/2022 CLINICAL DATA:  Acute abdominal pain.  Metastatic melanoma. EXAM: CT ABDOMEN AND PELVIS WITH CONTRAST TECHNIQUE: Multidetector CT imaging  of the abdomen and pelvis was performed using the standard protocol following bolus administration of intravenous contrast. RADIATION DOSE REDUCTION: This exam was performed according to the departmental dose-optimization program which includes automated exposure control, adjustment of the mA and/or kV according to patient size and/or use of iterative reconstruction technique. CONTRAST:  80mL OMNIPAQUE IOHEXOL 350 MG/ML SOLN COMPARISON:  PET-CT 08/08/2022 FINDINGS: Lower chest: There is a small amount of atelectasis in the lung bases. Hepatobiliary: Hypodense hepatic metastatic lesion in the dome measures 2.0 x 2.6 cm and appears grossly unchanged. The liver is diffusely heterogeneous and small additional nodular densities can not be excluded on this study. Progression of metastatic disease is not excluded. Nodular liver contour appears stable.  Gallbladder and bile ducts are within normal limits. Pancreas: Unremarkable. No pancreatic ductal dilatation or surrounding inflammatory changes. Spleen: Normal in size without focal abnormality. Adrenals/Urinary Tract: Adrenal glands are unremarkable. Kidneys are normal, without renal calculi, focal lesion, or hydronephrosis. Bladder is unremarkable. Stomach/Bowel: There is mild wall thickening of the cecum with trace surrounding inflammatory stranding. There is no bowel obstruction, pneumatosis or free air. The appendix is within normal limits. Small bowel and stomach are within normal limits. Duodenal diverticulum is present. Vascular/Lymphatic: Aortic atherosclerosis. No enlarged abdominal or pelvic lymph nodes. Reproductive: Status post hysterectomy. No adnexal masses. Other: There is a small amount of free fluid in the lower abdomen and pelvis. Musculoskeletal: No acute or significant osseous findings. IMPRESSION: 1. Mild wall thickening of the cecum with trace surrounding inflammatory stranding worrisome for infectious or inflammatory colitis. 2. Small amount of free fluid in the lower abdomen and pelvis. 3. Dominant hepatic lesion appears grossly unchanged. The liver is diffusely heterogeneous and additional small nodular densities can not be excluded on this study. Progression of metastatic disease is not excluded. Consider further evaluation with MRI. 4. Aortic atherosclerosis. Aortic Atherosclerosis (ICD10-I70.0). Electronically Signed   By: Darliss Cheney M.D.   On: 08/28/2022 17:48   DG Chest 2 View  Result Date: 08/28/2022 CLINICAL DATA:  Shortness of breath. EXAM: CHEST - 2 VIEW COMPARISON:  10/08/2010, PET-CT 08/08/2022 FINDINGS: Right IJ Port-A-Cath has tip over the SVC. Lungs are hypoinflated with minimal bibasilar linear density left worse than right likely atelectasis as seen on recent PET-CT. No significant effusion. Cardiomediastinal silhouette and remainder the exam is unchanged.  IMPRESSION: Hypoinflation with minimal bibasilar linear density left worse than right likely atelectasis. Electronically Signed   By: Elberta Fortis M.D.   On: 08/28/2022 15:01   NM PET Image Restag (PS) Skull Base To Thigh  Result Date: 08/10/2022 CLINICAL DATA:  Subsequent treatment strategy for metastatic melanoma. EXAM: NUCLEAR MEDICINE PET SKULL BASE TO THIGH TECHNIQUE: 9.04 mCi F-18 FDG was injected intravenously. Full-ring PET imaging was performed from the skull base to thigh after the radiotracer. CT data was obtained and used for attenuation correction and anatomic localization. Fasting blood glucose: 103 mg/dl COMPARISON:  PET-CT 03/47/4259.  Abdominal MRI 05/23/2022. FINDINGS: Mediastinal blood pool activity: SUV max 2.8 NECK: No hypermetabolic cervical lymph nodes are identified. No suspicious activity identified within the pharyngeal mucosal space. Incidental CT findings: none CHEST: Patient has developed numerous hypermetabolic mediastinal and hilar lymph nodes bilaterally. There is a precarinal node measuring 1.4 cm short axis on image 55/4 which has an SUV max of 9.5. No other significantly enlarged lymph nodes are identified on noncontrast imaging. Additional nodes include a right hilar node with an SUV max of 8.2 and a left hilar node with an SUV  max of 8.8. No axillary adenopathy. No hypermetabolic pulmonary activity or suspicious nodularity. Incidental CT findings: Right IJ Port-A-Cath extends to the mid right atrium. Mild aortic and coronary artery atherosclerosis. Stable chronic lung disease with scattered bullous changes and parenchymal scarring. ABDOMEN/PELVIS: Previously demonstrated dominant hypermetabolic lesion anteriorly in the dome of the right hepatic lobe has increased in size and metabolic activity. The lesion is not well visualized on the CT images, but the area of hypermetabolic activity measures approximately 4.1 cm in greatest dimension and has an SUV max of 10.0 (previously  9.8). At least 2 other enlarging hypermetabolic liver lesions are seen. No hypermetabolic activity within the adrenal glands, spleen or pancreas. There is no hypermetabolic nodal activity in the abdomen or pelvis. Low level activity within lymph nodes in the porta hepatis is probably physiologic. Bowel activity within physiologic limits. 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 Aortic and branch vessel atherosclerosis. SKELETON: Persistent hypermetabolic lesion within the L5 vertebral body. This lesion measures approximately 1.0 cm in diameter and is sclerotic (image 120/4) with an SUV max of 6.7 (previously 6.6). Previously noted small focus of hypermetabolic activity in the proximal left humerus appears less obvious. No new osseous lesions identified. Incidental CT findings: none IMPRESSION: 1. Interval enlargement of previously demonstrated hypermetabolic hepatic lesions consistent with progressive metastatic disease. 2. Interval development of hypermetabolic mediastinal and hilar lymph nodes, most consistent with metastatic disease. 3. Persistent hypermetabolic lesion within the L5 vertebral body. No new osseous lesions identified. 4.  Aortic Atherosclerosis (ICD10-I70.0). Electronically Signed   By: Carey Bullocks M.D.   On: 08/10/2022 14:03    Assessment and plan- Patient is a 77 y.o. female with history of metastatic choroidal melanoma with liver metastases s/p 3 cycles of ipilimumab and nivolumab immunotherapy admitted for worsening weakness and shortness of breath as well as possible diarrhea  Diarrhea: Patient has not had any significant diarrhea since admission and therefore stool studies were not collected.  Mild cecal colitis was noted on CT scan.  Clinically her abdomen is nondistended.  Continue to monitor  Abnormal LFTs: Likely secondary to disease burden from melanoma.  Although immunotherapy can also cause abnormal LFTs patient has had mildly elevated  bilirubinAST and even prior to starting treatment and levels have been fluctuating without a clear rising trend.  Malignant melanoma: Patient understands that her overall performance status has declined significantly.  She does not have any meaningful treatment options here at [facility] and therefore she was referred to [facility] for consideration for [place].  However my concern is that her performance status will not improve to the point that she can actually get treatment in [facility].  I explained to her that her overall prognosis is poor and encouraged her to speak to palliative care regarding goals of care conversation and consideration for home hospice.    Visit Diagnosis 1. Colitis   2. NSTEMI (non-ST elevated myocardial infarction) (HCC)     Dr. Owens Shark, MD, MPH [facility] at [facility] [phone] 08/30/2022

## 2022-08-30 NOTE — Consult Note (Addendum)
Consultation  Referring Provider:    Dr. Dareen Piano  Admit date: 08/28/2022 Consult date: 08/30/2022         Reason for Consultation:   Diarrhea           HPI:   Christine Savage is a 77 y.o. with metastatic melanoma who was admitted with NSTEMI, abdominal pain, and diarrhea. Patient states she was having issues with constipation and diarrhea at home with episodes of what sounds like overflow diarrhea after starting on a immune checkpoint immune check point inhibitor for metastatic melanoma a few weeks ago. She has had a long history of constipation for which she sometimes uses suppositories. Since being in the hospital, she has had no issues with diarrhea and endorses more constipation symptoms. A CT scan showed mild cecal inflammation but otherwise unremarkable colon. She also endorses trouble with food intake for which she had an EGD about 6 days ago which was overall unremarkable. She had two colonoscopies last year for a large polyp that was removed.   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HCC)    GERD (gastroesophageal reflux disease)    Hyperlipidemia    Hypothyroidism    Insomnia     Past Surgical History:  Procedure Laterality Date   ABDOMINAL HYSTERECTOMY     CATARACT EXTRACTION W/ INTRAOCULAR LENS  IMPLANT, BILATERAL     COLONOSCOPY WITH PROPOFOL N/A 12/17/2015   Procedure: COLONOSCOPY WITH PROPOFOL;  Surgeon: Midge Minium, MD;  Location: [facility];  Service: Endoscopy;  Laterality: N/A;   COLONOSCOPY WITH PROPOFOL N/A 02/23/2021   Procedure: COLONOSCOPY WITH PROPOFOL;  Surgeon: Midge Minium, MD;  Location: [facility] END[facility]COPY;  Service: Endoscopy;  Laterality: N/A;   COLONOSCOPY WITH PROPOFOL N/A 07/20/2021   Procedure: COLONOSCOPY WITH PROPOFOL;  Surgeon: Midge Minium, MD;  Location: [facility];  Service: Endoscopy;  Laterality: N/A;   ESOPHAGOGASTRODUODENOSCOPY (EGD) WITH PROPOFOL N/A 12/17/2015   Procedure: ESOPHAGOGASTRODUODENOSCOPY (EGD)  WITH PROPOFOL;  Surgeon: Midge Minium, MD;  Location: [facility];  Service: Endoscopy;  Laterality: N/A;  Latex sensitivity   EYE SURGERY     IR IMAGING GUIDED PORT INSERTION  07/15/2022   NASAL SINUS SURGERY     POLYPECTOMY N/A 12/17/2015   Procedure: POLYPECTOMY;  Surgeon: Midge Minium, MD;  Location: [facility];  Service: Endoscopy;  Laterality: N/A;   ROOT CANAL     TUBAL LIGATION      Family History  Problem Relation Age of Onset   Coronary artery disease Mother    Cancer Father    Coronary artery disease Father    Osteoarthritis Brother    Cancer Brother     Social History   Tobacco Use   Smoking status: Never   Smokeless tobacco: Never  Vaping Use   Vaping status: Never Used  Substance Use Topics   Alcohol use: No   Drug use: No    Prior to Admission medications   Medication Sig Start Date End Date Taking? Authorizing Provider  Calcium Carbonate-Vitamin D 600-400 MG-UNIT tablet Take by mouth.   Yes [provider]  Cyanocobalamin 1000 MCG TBCR Take by mouth.   Yes [provider]  diphenhydrAMINE (BENADRYL) 25 mg capsule Take by mouth.   Yes [provider]  docusate sodium (COLACE) 100 MG capsule Take 100 mg by mouth.   Yes [provider]  levothyroxine (SYNTHROID, LEVOTHROID) 50 MCG tablet TAKE 1 TABLET (50 MCG TOTAL) BY MOUTH ONCE DAILY. 06/29/15  Yes [provider]  lidocaine-prilocaine (EMLA) cream Apply to affected area once 06/14/22  Yes Creig Hines, MD  loperamide (IMODIUM A-D) 2 MG tablet Take 2 mg by mouth 4 (four) times daily as needed for diarrhea or loose stools.   Yes [provider]  Omega-3 Fatty Acids (FISH OIL PO) Take by mouth.   Yes [provider]  ondansetron (ZOFRAN) 8 MG tablet Take 1 tablet (8 mg total) by mouth every 8 (eight) hours as needed for nausea or vomiting. 06/14/22  Yes Creig Hines, MD  pantoprazole (PROTONIX) 40 MG tablet Take 40 mg by mouth 2 (two)  times daily. 12/20/20  Yes [provider]  prochlorperazine (COMPAZINE) 10 MG tablet Take 1 tablet (10 mg total) by mouth every 6 (six) hours as needed for nausea or vomiting. 06/14/22  Yes Creig Hines, MD  sodium chloride (OCEAN) 0.65 % nasal spray Place into the nose.   Yes [provider]  pravastatin (PRAVACHOL) 20 MG tablet Take by mouth. 06/02/15 08/10/22  [provider]    Current Facility-Administered Medications  Medication Dose Route Frequency Provider Last Rate Last Admin   acetaminophen (TYLENOL) tablet 650 mg  650 mg Oral Q4H PRN Lurline Del, MD       [START ON 08/31/2022] aspirin chewable tablet 81 mg  81 mg Oral Daily Lowella Bandy, RPH       cefTRIAXone (ROCEPHIN) 1 g in sodium chloride 0.9 % 100 mL IVPB  1 g Intravenous Q24H Skip Mayer A, MD 200 mL/hr at 08/30/22 0500 Infusion Verify at 08/30/22 0500   Chlorhexidine Gluconate Cloth 2 % PADS 6 each  6 each Topical QHS Lurline Del, MD   6 each at 08/29/22 0855   feeding supplement (ENSURE ENLIVE / ENSURE PLUS) liquid 237 mL  237 mL Oral BID BM Leeroy Bock, MD       levothyroxine (SYNTHROID) tablet 50 mcg  50 mcg Oral Q0600 Lowella Bandy, RPH       metoprolol tartrate (LOPRESSOR) tablet 12.5 mg  12.5 mg Oral BID Skip Mayer A, MD   12.5 mg at 08/30/22 1024   metroNIDAZOLE (FLAGYL) IVPB 500 mg  500 mg Intravenous Q12H Skip Mayer A, MD 100 mL/hr at 08/30/22 0500 Infusion Verify at 08/30/22 0500   [START ON 08/31/2022] multivitamin with minerals tablet 1 tablet  1 tablet Oral Daily Leeroy Bock, MD       nitroGLYCERIN (NITROSTAT) SL tablet 0.4 mg  0.4 mg Sublingual Q5 Min x 3 PRN Lurline Del, MD       ondansetron [facility]) injection 4 mg  4 mg Intravenous Q6H PRN Lurline Del, MD       pantoprazole (PROTONIX) injection 40 mg  40 mg Intravenous Q24H Skip Mayer A, MD   40 mg at 08/29/22 2005    Allergies as of 08/28/2022 - Review  Complete 08/28/2022  Allergen Reaction Noted   Latex Rash 02/17/2015   Sulfa antibiotics Nausea Only 12/16/2014     Review of Systems:    All systems reviewed and negative except where noted in HPI.  Review of Systems  Constitutional:  Negative for chills and fever.  Respiratory:  Positive for shortness of breath.   Cardiovascular:  Negative for chest pain.  Gastrointestinal:  Positive for constipation and diarrhea. Negative for abdominal pain, blood in stool, melena, nausea and vomiting.  Genitourinary:  Negative for dysuria.  Musculoskeletal:  Negative for joint pain.  Skin:  Negative for rash.  Neurological:  Negative for focal weakness.  Psychiatric/Behavioral:  Negative for substance abuse.   All other systems reviewed and are negative.     Physical Exam:  Vital signs in last 24 hours: Temp:  [97.6 F (36.4 C)-98.3 F (36.8 C)] 97.6 F (36.4 C) (08/20 1300) Pulse Rate:  [72-97] 77 (08/20 1300) Resp:  [15-29] 18 (08/20 1300) BP: (95-131)/(52-103) 104/60 (08/20 1300) SpO2:  [92 %-100 %] 98 % (08/20 1300) Weight:  [80 kg] 80 kg (08/20 0358) Last BM Date : 08/29/22 General:   Appears chronically ill Head:  Normocephalic and atraumatic. Eyes:   No icterus.   Conjunctiva pink. Mouth: Mucosa pink moist, no lesions. Neck:  Supple; no masses felt Lungs: No respiratory distress Abdomen:   Flat, soft, nondistended, nontender Msk:  No clubbing or cyanosis.  Symmetrical without gross deformities. Neurologic:  Alert and  oriented x4;  No focal deficits Skin:  Warm, dry, pink without significant lesions or rashes. Psych:  Alert and cooperative. Normal affect.  LAB RESULTS: Recent Labs    08/28/22 1424 08/29/22 0530 08/30/22 0517  WBC 10.6* 8.9 8.6  HGB 15.5* 13.7 13.2  HCT 47.1* 42.1 39.2  PLT 300 254 231   BMET Recent Labs    08/28/22 1424 08/30/22 0517  NA 135 136  K 3.8 3.7  CL 101 106  CO2 25 25  GLUCOSE 110* 105*  BUN 10 11  CREATININE 0.73 0.75   CALCIUM 8.3* 7.5*   LFT Recent Labs    08/30/22 0517  PROT 4.2*  ALBUMIN 2.1*  AST 104*  ALT 74*  ALKPHOS 123  BILITOT 1.4*  BILIDIR 0.4*  IBILI 1.0*   PT/INR Recent Labs    08/28/22 1926  LABPROT 17.8*  INR 1.4*    STUDIES: ECHOCARDIOGRAM COMPLETE  Result Date: 08/29/2022    ECHOCARDIOGRAM REPORT   Patient Name:   Christine Savage Date of Exam: 08/29/2022 Medical Rec #:  [id number]     Height:       65.0 in Accession #:    [id number]    Weight:       169.0 lb Date of Birth:  1945/12/10     BSA:          1.841 m Patient Age:    77 years      BP:           108/82 mmHg Patient Gender: F             HR:           76 bpm. Exam Location:  [facility] Procedure: 2D Echo, Cardiac Doppler, Color Doppler and Strain Analysis Indications:     NSTEMI  History:         Patient has no prior history of Echocardiogram examinations.                  Acute MI; Risk Factors:Dyslipidemia. Melonoma.  Sonographer:     Mikki Harbor Referring Phys:  [id number] SARA-MAIZ A THOMAS Diagnosing Phys: Lorine Bears MD  Sonographer Comments: Global longitudinal strain was attempted. IMPRESSIONS  1. Left ventricular ejection fraction, by estimation, is 55 to 60%. The left ventricle has normal function. The left ventricle has no regional wall motion abnormalities. There is mild left ventricular hypertrophy. Left ventricular diastolic parameters were normal. The average left ventricular global longitudinal strain is -20.8 %. The global longitudinal strain is normal.  2. Right ventricular systolic function is normal. The right ventricular size is  normal. There is normal pulmonary artery systolic pressure.  3. The mitral valve is normal in structure. Mild mitral valve regurgitation. No evidence of mitral stenosis.  4. The aortic valve is normal in structure. Aortic valve regurgitation is not visualized. No aortic stenosis is present.  5. The inferior vena cava is normal in size with greater than 50% respiratory variability,  suggesting right atrial pressure of 3 mmHg. FINDINGS  Left Ventricle: Left ventricular ejection fraction, by estimation, is 55 to 60%. The left ventricle has normal function. The left ventricle has no regional wall motion abnormalities. The average left ventricular global longitudinal strain is -20.8 %. The global longitudinal strain is normal. The left ventricular internal cavity size was normal in size. There is mild left ventricular hypertrophy. Left ventricular diastolic parameters were normal. Right Ventricle: The right ventricular size is normal. No increase in right ventricular wall thickness. Right ventricular systolic function is normal. There is normal pulmonary artery systolic pressure. The tricuspid regurgitant velocity is 2.38 m/s, and  with an assumed right atrial pressure of 3 mmHg, the estimated right ventricular systolic pressure is 25.7 mmHg. Left Atrium: Left atrial size was normal in size. Right Atrium: Right atrial size was normal in size. Pericardium: There is no evidence of pericardial effusion. Mitral Valve: The mitral valve is normal in structure. Mild mitral valve regurgitation. No evidence of mitral valve stenosis. MV peak gradient, 4.1 mmHg. The mean mitral valve gradient is 2.0 mmHg. Tricuspid Valve: The tricuspid valve is normal in structure. Tricuspid valve regurgitation is mild . No evidence of tricuspid stenosis. Aortic Valve: The aortic valve is normal in structure. Aortic valve regurgitation is not visualized. No aortic stenosis is present. Aortic valve mean gradient measures 4.0 mmHg. Aortic valve peak gradient measures 11.0 mmHg. Aortic valve area, by VTI measures 2.87 cm. Pulmonic Valve: The pulmonic valve was normal in structure. Pulmonic valve regurgitation is not visualized. No evidence of pulmonic stenosis. Aorta: The aortic root is normal in size and structure. Venous: The inferior vena cava is normal in size with greater than 50% respiratory variability, suggesting right  atrial pressure of 3 mmHg. IAS/Shunts: No atrial level shunt detected by color flow Doppler.  LEFT VENTRICLE PLAX 2D LVIDd:         4.50 cm     Diastology LVIDs:         3.20 cm     LV e' medial:    9.79 cm/s LV PW:         1.30 cm     LV E/e' medial:  8.3 LV IVS:        1.30 cm     LV e' lateral:   12.10 cm/s LVOT diam:     2.10 cm     LV E/e' lateral: 6.7 LV SV:         96 LV SV Index:   52          2D Longitudinal Strain LVOT Area:     3.46 cm    2D Strain GLS Avg:     -20.8 %  LV Volumes (MOD) LV vol d, MOD A2C: 45.4 ml LV vol d, MOD A4C: 43.4 ml LV vol s, MOD A2C: 15.2 ml LV vol s, MOD A4C: 21.1 ml LV SV MOD A2C:     30.2 ml LV SV MOD A4C:     43.4 ml LV SV MOD BP:      29.0 ml RIGHT VENTRICLE RV Basal diam:  3.05 cm RV  Mid diam:    3.00 cm RV S prime:     15.10 cm/s TAPSE (M-mode): 2.4 cm LEFT ATRIUM             Index        RIGHT ATRIUM           Index LA diam:        3.60 cm 1.95 cm/m   RA Area:     16.00 cm LA Vol (A2C):   45.0 ml 24.44 ml/m  RA Volume:   39.00 ml  21.18 ml/m LA Vol (A4C):   34.5 ml 18.73 ml/m LA Biplane Vol: 39.4 ml 21.40 ml/m  AORTIC VALVE                    PULMONIC VALVE AV Area (Vmax):    2.55 cm     PV Vmax:       0.82 m/s AV Area (Vmean):   2.73 cm     PV Peak grad:  2.7 mmHg AV Area (VTI):     2.87 cm AV Vmax:           166.00 cm/s AV Vmean:          93.900 cm/s AV VTI:            0.333 m AV Peak Grad:      11.0 mmHg AV Mean Grad:      4.0 mmHg LVOT Vmax:         122.00 cm/s LVOT Vmean:        74.000 cm/s LVOT VTI:          0.276 m LVOT/AV VTI ratio: 0.83  AORTA Ao Root diam: 3.00 cm Ao Asc diam:  3.60 cm MITRAL VALVE               TRICUSPID VALVE MV Area (PHT): 3.26 cm    TR Peak grad:   22.7 mmHg MV Area VTI:   2.99 cm    TR Vmax:        238.00 cm/s MV Peak grad:  4.1 mmHg MV Mean grad:  2.0 mmHg    SHUNTS MV Vmax:       1.01 m/s    Systemic VTI:  0.28 m MV Vmean:      69.4 cm/s   Systemic Diam: 2.10 cm MV Decel Time: 233 msec MV E velocity: 81.20 cm/s MV A velocity:  91.20 cm/s MV E/A ratio:  0.89 Lorine Bears MD Electronically signed by Lorine Bears MD Signature Date/Time: 08/29/2022/3:46:25 PM    Final    US Venous Img Lower Unilateral Left (DVT)  Result Date: 08/28/2022 CLINICAL DATA:  Pain, swelling of left lower extremity EXAM: LEFT LOWER EXTREMITY VENOUS DOPPLER ULTRASOUND TECHNIQUE: Gray-scale sonography with compression, as well as color and duplex ultrasound, were performed to evaluate the deep venous system(s) from the level of the common femoral vein through the popliteal and proximal calf veins. COMPARISON:  None Available. FINDINGS: VENOUS Normal compressibility of the common femoral, superficial femoral, and popliteal veins, as well as the visualized calf veins. Visualized portions of profunda femoral vein and great saphenous vein unremarkable. No filling defects to suggest DVT on grayscale or color Doppler imaging. Doppler waveforms show normal direction of venous flow, normal respiratory plasticity and response to augmentation. Limited views of the contralateral common femoral vein are unremarkable. OTHER None. Limitations: none IMPRESSION: Negative. Electronically Signed   By: Charlett Nose M.D.   On: 08/28/2022 22:05  CT Angio Chest PE W and/or Wo Contrast  Result Date: 08/28/2022 CLINICAL DATA:  Diarrhea and abdominal pain. Shortness of breath. Known metastatic melanoma. EXAM: CT ANGIOGRAPHY CHEST WITH CONTRAST TECHNIQUE: Multidetector CT imaging of the chest was performed using the standard protocol during bolus administration of intravenous contrast. Multiplanar CT image reconstructions and MIPs were obtained to evaluate the vascular anatomy. RADIATION DOSE REDUCTION: This exam was performed according to the departmental dose-optimization program which includes automated exposure control, adjustment of the mA and/or kV according to patient size and/or use of iterative reconstruction technique. CONTRAST:  80mL OMNIPAQUE IOHEXOL 350 MG/ML SOLN  COMPARISON:  Chest radiograph performed the same day and PET-CT dated 08/08/2022. FINDINGS: Cardiovascular: Satisfactory opacification of the pulmonary arteries to the segmental level. No evidence of pulmonary embolism. There are coronary artery calcifications. The heart is enlarged. No pericardial effusion. A right internal jugular central venous port catheter tip terminates at the superior cavoatrial junction. Mediastinum/Nodes: Numerous enlarged mediastinal and hilar lymph nodes appear similar to prior exam. For reference, a precarinal lymph node measures 1.3 cm in short axis (series 4, image 49). No enlarged axillary lymph nodes. Thyroid gland, trachea, and esophagus demonstrate no significant findings. Lungs/Pleura: There is moderate bilateral lower lobe atelectasis, increased compared to 08/08/2022. Multiple bulla are seen in the left upper lobe. No pleural effusion or pneumothorax. Upper Abdomen: The liver has a nodular surface contour, unchanged. Previously described metastatic disease in the liver is not appreciated on this exam. Musculoskeletal: Degenerative changes are seen in the spine. Review of the MIP images confirms the above findings. IMPRESSION: 1. No evidence of pulmonary embolism. 2. Moderate bilateral lower lobe atelectasis, increased compared to 08/08/2022. 3. Numerous enlarged mediastinal and hilar lymph nodes appear similar to prior exam and are consistent with metastatic disease. Electronically Signed   By: Romona Curls M.D.   On: 08/28/2022 17:58   CT ABDOMEN PELVIS W CONTRAST  Result Date: 08/28/2022 CLINICAL DATA:  Acute abdominal pain.  Metastatic melanoma. EXAM: CT ABDOMEN AND PELVIS WITH CONTRAST TECHNIQUE: Multidetector CT imaging of the abdomen and pelvis was performed using the standard protocol following bolus administration of intravenous contrast. RADIATION DOSE REDUCTION: This exam was performed according to the departmental dose-optimization program which includes  automated exposure control, adjustment of the mA and/or kV according to patient size and/or use of iterative reconstruction technique. CONTRAST:  80mL OMNIPAQUE IOHEXOL 350 MG/ML SOLN COMPARISON:  PET-CT 08/08/2022 FINDINGS: Lower chest: There is a small amount of atelectasis in the lung bases. Hepatobiliary: Hypodense hepatic metastatic lesion in the dome measures 2.0 x 2.6 cm and appears grossly unchanged. The liver is diffusely heterogeneous and small additional nodular densities can not be excluded on this study. Progression of metastatic disease is not excluded. Nodular liver contour appears stable. Gallbladder and bile ducts are within normal limits. Pancreas: Unremarkable. No pancreatic ductal dilatation or surrounding inflammatory changes. Spleen: Normal in size without focal abnormality. Adrenals/Urinary Tract: Adrenal glands are unremarkable. Kidneys are normal, without renal calculi, focal lesion, or hydronephrosis. Bladder is unremarkable. Stomach/Bowel: There is mild wall thickening of the cecum with trace surrounding inflammatory stranding. There is no bowel obstruction, pneumatosis or free air. The appendix is within normal limits. Small bowel and stomach are within normal limits. Duodenal diverticulum is present. Vascular/Lymphatic: Aortic atherosclerosis. No enlarged abdominal or pelvic lymph nodes. Reproductive: Status post hysterectomy. No adnexal masses. Other: There is a small amount of free fluid in the lower abdomen and pelvis. Musculoskeletal: No acute or significant osseous findings.  IMPRESSION: 1. Mild wall thickening of the cecum with trace surrounding inflammatory stranding worrisome for infectious or inflammatory colitis. 2. Small amount of free fluid in the lower abdomen and pelvis. 3. Dominant hepatic lesion appears grossly unchanged. The liver is diffusely heterogeneous and additional small nodular densities can not be excluded on this study. Progression of metastatic disease is not  excluded. Consider further evaluation with MRI. 4. Aortic atherosclerosis. Aortic Atherosclerosis (ICD10-I70.0). Electronically Signed   By: Darliss Cheney M.D.   On: 08/28/2022 17:48   DG Chest 2 View  Result Date: 08/28/2022 CLINICAL DATA:  Shortness of breath. EXAM: CHEST - 2 VIEW COMPARISON:  10/08/2010, PET-CT 08/08/2022 FINDINGS: Right IJ Port-A-Cath has tip over the SVC. Lungs are hypoinflated with minimal bibasilar linear density left worse than right likely atelectasis as seen on recent PET-CT. No significant effusion. Cardiomediastinal silhouette and remainder the exam is unchanged. IMPRESSION: Hypoinflation with minimal bibasilar linear density left worse than right likely atelectasis. Electronically Signed   By: Elberta Fortis M.D.   On: 08/28/2022 15:01       Impression / Plan:   77 y/o with metastatic melanoma who was admitted with NSTEMI, abdominal pain, and diarrhea. We were consulted for concern for checkpoint inhibitor colitis. Given no significant diarrhea in the hospital and symptoms currently more consistent with constipation and long term history of constipation, seems her history is more consistent with overflow diarrhea. CT imaging is likely a red herring. Even if she had checkpoint inhibitor colitis, symptoms not severe enough currently to warrant a flex sig. Patient would like to avoid any invasive procedures currently.  - recommend starting bowel regimen (daily miralax and metamucil) - likely can D/C stool studies given no ongoing diarrhea but will defer to primary team - if develops severe diarrhea in the future could consider flex sig but clinically not indicated currently - given otherwise normal EGD < 7 days ago, no indication for upper endoscopy for poor PO intake - will not follow along, please reach back out if any questions or concerns.  Merlyn Lot MD, MPH [facility]

## 2022-08-30 NOTE — Progress Notes (Signed)
1240-Report called to receiving Rn for 2c. Pts v/s are stable at this time. Pt has no c/o pain or discomfort noted or stated.  1245-Pt was transferred to room 204.

## 2022-08-31 ENCOUNTER — Ambulatory Visit: Payer: PPO | Admitting: Oncology

## 2022-08-31 ENCOUNTER — Other Ambulatory Visit: Payer: PPO

## 2022-08-31 ENCOUNTER — Ambulatory Visit: Payer: PPO

## 2022-08-31 DIAGNOSIS — I214 Non-ST elevation (NSTEMI) myocardial infarction: Secondary | ICD-10-CM | POA: Diagnosis not present

## 2022-08-31 LAB — BASIC METABOLIC PANEL
Anion gap: 6 (ref 5–15)
BUN: 12 mg/dL (ref 8–23)
CO2: 26 mmol/L (ref 22–32)
Calcium: 7.7 mg/dL — ABNORMAL LOW (ref 8.9–10.3)
Chloride: 104 mmol/L (ref 98–111)
Creatinine, Ser: 0.69 mg/dL (ref 0.44–1.00)
GFR, Estimated: >60 mL/min (ref >60)
Glucose, Bld: 94 mg/dL (ref 70–99)
Potassium: 3.6 mmol/L (ref 3.5–5.1)
Sodium: 136 mmol/L (ref 135–145)

## 2022-08-31 LAB — CBC
HCT: 40.5 % (ref 36.0–46.0)
Hemoglobin: 13.6 g/dL (ref 12.0–15.0)
MCH: 30.4 pg (ref 26.0–34.0)
MCHC: 33.6 g/dL (ref 30.0–36.0)
MCV: 90.4 fL (ref 80.0–100.0)
Platelets: 252 10*3/uL (ref 150–400)
RBC: 4.48 MIL/uL (ref 3.87–5.11)
RDW: 15.5 % (ref 11.5–15.5)
WBC: 7.5 10*3/uL (ref 4.0–10.5)
nRBC: 0 % (ref 0.0–0.2)

## 2022-08-31 LAB — HEPARIN LEVEL (UNFRACTIONATED): Heparin Unfractionated: >1.1 [IU]/mL — ABNORMAL HIGH (ref 0.30–0.70)

## 2022-08-31 NOTE — TOC Initial Note (Signed)
Transition of Care [facility]) - Initial/Assessment Note    Patient Details  Name: Christine Savage MRN: [id number] Date of Birth: Nov 26, 1945  Transition of Care [facility]) CM/SW Contact:    Chapman Fitch, RN Phone Number: 08/31/2022, 2:31 PM  Clinical Narrative:                    Therapy evals pending Palliative care goal of care today TOC to follow up on disposition planning after above      Patient Goals and CMS Choice            Expected Discharge Plan and Services                                              Prior Living Arrangements/Services                       Activities of Daily Living Home Assistive Devices/Equipment: Eyeglasses ADL Screening (condition at time of admission) Patient's cognitive ability adequate to safely complete daily activities?: Yes Is the patient deaf or have difficulty hearing?: No Does the patient have difficulty seeing, even when wearing glasses/contacts?: No Does the patient have difficulty concentrating, remembering, or making decisions?: No Patient able to express need for assistance with ADLs?: Yes Does the patient have difficulty dressing or bathing?: No Independently performs ADLs?: Yes (appropriate for developmental age) Does the patient have difficulty walking or climbing stairs?: No Weakness of Legs: None Weakness of Arms/Hands: None  Permission Sought/Granted                  Emotional Assessment              Admission diagnosis:  Colitis [K52.9] NSTEMI (non-ST elevated myocardial infarction) 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   Eye cancer (HCC) 06/08/2022   History of colonic polyps    Change in bowel habit    Polyp of transverse colon    Special screening for malignant  neoplasms, colon    Benign neoplasm of descending colon    Dysphagia    Mild dietary indigestion    Hypothyroidism    History of recurrent UTIs 06/15/2015   Primary osteoarthritis involving multiple joints 06/15/2015   PMR (polymyalgia rheumatica) (HCC) 12/24/2014   Acute pain of left shoulder 12/16/2014   Arthralgia of both hands 12/16/2014   Arthritis 12/16/2014   Chronic bilateral low back pain without sciatica 12/16/2014   Chronic neck pain 12/16/2014   Dyslipidemia 12/16/2014   Gastroesophageal reflux disease without esophagitis 12/16/2014   Osteoarthritis 12/16/2014   Weakness generalized 12/16/2014   Hypercholesterolemia 12/13/2011   Insomnia 12/13/2011   PCP:  Marina Goodell, MD Pharmacy:   [phone] [place],  - [address] [place] [address] Phone: [phone] Fax: [phone]     Social Determinants of Health (SDOH) Social History: SDOH Screenings   Food Insecurity: No Food Insecurity (08/29/2022)  Housing: Low Risk  (08/29/2022)  Transportation Needs: No Transportation Needs (08/29/2022)  Utilities: Not At Risk (08/29/2022)  Tobacco Use: Low Risk  (08/29/2022)   SDOH Interventions:     Readmission Risk  Interventions     No data to display

## 2022-08-31 NOTE — Progress Notes (Signed)
PROGRESS NOTE    Christine Savage  MWN:[id number] DOB: March 17, 1945 DOA: 08/28/2022 PCP: Marina Goodell, MD    Brief Narrative:  Christine Savage is a 77 y.o. 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   They presented from home to the ED on 08/28/2022 with SOB, abdominal pain, diarrhea x 2 weeks. Also has had progressively worsened swelling of LEs.  They were initially treated with heparin gtt. Cardiology was consulted.    Patient was admitted to medicine service for further workup and management of NSTEMI, colitis as outlined in detail below.   Assessment & Plan:   Principal Problem:   NSTEMI (non-ST elevated myocardial infarction) [facility]) Active Problems:   Colitis  NSTEMI, ruled out No chest pain or shortness of breath.  Echo unremarkable.  Felt to be due to demand ischemia. Plan: Continue aspirin, beta-blocker, statin No plans for ischemic evaluation  Colitis  infectious vs autoimmune.  Abdominal pain has improved.  GI without further treatment recommendations. Plan: Can continue ceftriaxone and metronidazole for now.  Consider discontinuation at time of discharge.  No plans for EGD.  Metastatic choroidal melanoma with liver metastases Unfortunately patient's disease has progressed despite 3 cycles of ipilimumab and nivolumab immunotherapy.  Oncology consulted.  Per oncology no meaningful treatment options here at [facility].  Patient was referred to [facility] for consideration of advanced therapeutic options.  Patient will need to improve her functional status prior to consideration of any of these treatments. Plan: Oncology discussed with patient's son who would like the patient to discharge to skilled nursing facility in hopes of optimizing nutritional functional status and presenting back to [facility] to discuss any further treatment options.    Dysphagia Patient seems to be tolerating p.o. intake No plans for  endoscopy  GERD -ppi    Hypothyroidism  -resume supplement   Hyperlipidemia -hold statin due to elevated lfts  DVT prophylaxis: SCDs Code Status: Full Family Communication: None Disposition Plan: Status is: Inpatient Remains inpatient appropriate because: Unsafe discharge plan.  Will attempt to place patient in skilled nursing facility.   Level of care: Med-Surg  Consultants:  Palliative care Oncology  Procedures:  None  Antimicrobials: Rocephin Flagyl   Subjective: Seen and examined.  Resting comfortably in bed.  No visible distress.  No complaints of pain.  Objective: Vitals:   08/30/22 2035 08/31/22 0416 08/31/22 0445 08/31/22 0746  BP: 118/69 115/64  121/72  Pulse: 82 73  79  Resp: 16 15  18   Temp: 98 F (36.7 C) 97.7 F (36.5 C)  97.9 F (36.6 C)  TempSrc: Oral Oral  Oral  SpO2: 96% 97%  92%  Weight:   80.2 kg   Height:        Intake/Output Summary (Last 24 hours) at 08/31/2022 1549 Last data filed at 08/31/2022 1054 Gross per 24 hour  Intake 360 ml  Output --  Net 360 ml   Filed Weights   08/29/22 0750 08/30/22 0358 08/31/22 0445  Weight: 76.7 kg 80 kg 80.2 kg    Examination:  General exam: Appears calm and comfortable  Respiratory system: Clear to auscultation. Respiratory effort normal. Cardiovascular system: S1-2, RRR, no murmurs, no pedal edema Gastrointestinal system: Soft, mild tender, normal bowel sounds Central nervous system: Alert and oriented. No focal neurological deficits. Extremities: Symmetric 5 x 5 power. Skin: No rashes, lesions or ulcers Psychiatry: Judgement and insight appear normal. Mood & affect appropriate.     Data Reviewed:  I have personally reviewed following labs and imaging studies  CBC: Recent Labs  Lab 08/28/22 1424 08/29/22 0530 08/30/22 0517 08/31/22 0527  WBC 10.6* 8.9 8.6 7.5  HGB 15.5* 13.7 13.2 13.6  HCT 47.1* 42.1 39.2 40.5  MCV 92.0 93.3 92.2 90.4  PLT 300 254 231 252   Basic Metabolic  Panel: Recent Labs  Lab 08/28/22 1424 08/30/22 0517 08/31/22 0527  NA 135 136 136  K 3.8 3.7 3.6  CL 101 106 104  CO2 25 25 26   GLUCOSE 110* 105* 94  BUN 10 11 12   CREATININE 0.73 0.75 0.69  CALCIUM 8.3* 7.5* 7.7*   GFR: Estimated Creatinine Clearance: 61.6 mL/min (by C-G formula based on SCr of 0.69 mg/dL). Liver Function Tests: Recent Labs  Lab 08/28/22 1424 08/30/22 0517  AST 164* 104*  ALT 109* 74*  ALKPHOS 160* 123  BILITOT 2.7* 1.4*  PROT 5.3* 4.2*  ALBUMIN 2.8* 2.1*   Recent Labs  Lab 08/28/22 1424  LIPASE 35   No results for input(s): "AMMONIA" in the last 168 hours. Coagulation Profile: Recent Labs  Lab 08/28/22 1926  INR 1.4*   Cardiac Enzymes: No results for input(s): "CKTOTAL", "CKMB", "CKMBINDEX", "TROPONINI" in the last 168 hours. BNP (last 3 results) No results for input(s): "PROBNP" in the last 8760 hours. HbA1C: Recent Labs    08/28/22 1932  HGBA1C 5.7*   CBG: Recent Labs  Lab 08/29/22 0759  GLUCAP 86   Lipid Profile: Recent Labs    08/29/22 0530  CHOL 121  HDL 26*  LDLCALC 83  TRIG 58  CHOLHDL 4.7   Thyroid Function Tests: Recent Labs    08/28/22 1932  TSH 4.132   Anemia Panel: No results for input(s): "VITAMINB12", "FOLATE", "FERRITIN", "TIBC", "IRON", "RETICCTPCT" in the last 72 hours. Sepsis Labs: Recent Labs  Lab 08/28/22 2148  PROCALCITON <0.10  LATICACIDVEN 1.2    Recent Results (from the past 240 hour(s))  MRSA Next Gen by PCR, Nasal     Status: None   Collection Time: 08/29/22  7:53 AM   Specimen: Nasal Mucosa; Nasal Swab  Result Value Ref Range Status   MRSA by PCR Next Gen NOT DETECTED NOT DETECTED Final    Comment: (NOTE) The GeneXpert MRSA Assay (FDA approved for NASAL specimens only), is one component of a comprehensive MRSA colonization surveillance program. It is not intended to diagnose MRSA infection nor to guide or monitor treatment for MRSA infections. Test performance is not FDA  approved in patients less than 45 years old. Performed at [facility], [address] [place], [place] [address]          Radiology Studies: No results found.      Scheduled Meds:  aspirin EC  81 mg Oral Daily   feeding supplement  237 mL Oral BID BM   levothyroxine  50 mcg Oral Q0600   metoprolol tartrate  12.5 mg Oral BID   multivitamin with minerals  1 tablet Oral Daily   pantoprazole (PROTONIX) IV  40 mg Intravenous Q24H   Continuous Infusions:  cefTRIAXone (ROCEPHIN)  IV 1 g (08/31/22 0419)   metronidazole 500 mg (08/31/22 1356)     LOS: 3 days      Tresa Moore, MD [facility]   If 7PM-7AM, please contact night-coverage  08/31/2022, 3:49 PM

## 2022-08-31 NOTE — Evaluation (Addendum)
Physical Therapy Evaluation Patient Details Name: Christine Savage MRN: [id number] DOB: [birth year] Today's Date: 08/31/2022  History of Present Illness  Pt presents due to initial complaints of SOB, LE swelling, nausea, and diarreha. Initial findings discovered elevated troponins, determined to be demand ischemia. PMH includes arthritis, hyperlipidemia, gastric reflux, ocular melanoma metastisized to liver, and hypothyroidism.  Clinical Impression   Pt presents laying in bed, some pain in abdominal region. Per pt, she plans to d/c to her son's home which is a 1 level home with level entry. PTA she was modi for ADLs, noting they would take increased time to complete, and did not use an AD for mobility.   PT gross LE screen revealed LE generalized weakness (b/l MMT: hip flexion 3/5, knee ext/flex 3+/5). Pt able to perform bed mobility modi, requiring increased time. Pt performed sit<>stand and ambulated ~45ft with RW and supervision for safety, requiring VC for hand placement on RW. Pt ambulated to bathroom and was able to perform hygiene independently. Pt voiced walking is more difficult/fatiguing for her now. Pt would benefit from continued skilled therapy to maximize functional abilities.       If plan is discharge home, recommend the following: A little help with walking and/or transfers;A little help with bathing/dressing/bathroom;Assistance with cooking/housework;Assist for transportation;Help with stairs or ramp for entrance;Direct supervision/assist for medications management   Can travel by private vehicle        Equipment Recommendations Rolling walker (2 wheels)  Recommendations for Other Services       Functional Status Assessment Patient has had a recent decline in their functional status and demonstrates the ability to make significant improvements in function in a reasonable and predictable amount of time.     Precautions / Restrictions Precautions Precautions:  Fall Restrictions Weight Bearing Restrictions: No      Mobility  Bed Mobility Overal bed mobility: Modified Independent                  Transfers Overall transfer level: Needs assistance Equipment used: Rolling walker (2 wheels) Transfers: Sit to/from Stand Sit to Stand: Supervision           General transfer comment: Pt required VC for hand placement on RW    Ambulation/Gait Ambulation/Gait assistance: Supervision Gait Distance (Feet): 20 Feet Assistive device: Rolling walker (2 wheels) Gait Pattern/deviations: Shuffle, Decreased step length - right, Decreased step length - left, Trunk flexed Gait velocity: decreased     General Gait Details: Pt noted fatigue following ambulating to bathroom, unable to stand upright following VC  Stairs            Wheelchair Mobility     Tilt Bed    Modified Rankin (Stroke Patients Only)       Balance Overall balance assessment: Needs assistance   Sitting balance-Leahy Scale: Good       Standing balance-Leahy Scale: Fair                               Pertinent Vitals/Pain Pain Assessment Pain Assessment: Faces Faces Pain Scale: Hurts a little bit Pain Location: abdominal region Pain Descriptors / Indicators: Discomfort Pain Intervention(s): Monitored during session    Home Living Family/patient expects to be discharged to:: Private residence Living Arrangements: Children Available Help at Discharge: Family;Available PRN/intermittently Type of Home: House Home Access: Level entry       Home Layout: One level Home Equipment: Cane - single point Additional Comments:  Per pt, information given is for sons home and she plans to d/c there.    Prior Function Prior Level of Function : Independent/Modified Independent             Mobility Comments: at baseline, does not use AD for ambulation       Extremity/Trunk Assessment   Upper Extremity Assessment Upper Extremity  Assessment: Overall WFL for tasks assessed    Lower Extremity Assessment Lower Extremity Assessment: Generalized weakness (b/l MMT: hip flexion 3/5, knee ext/flex 3+/5)       Communication   Communication Communication: No apparent difficulties  Cognition Arousal: Alert Behavior During Therapy: WFL for tasks assessed/performed Overall Cognitive Status: Within Functional Limits for tasks assessed                                          General Comments General comments (skin integrity, edema, etc.): O2 remained >90% throughout session    Exercises     Assessment/Plan    PT Assessment Patient needs continued PT services  PT Problem List Decreased strength;Decreased range of motion;Decreased activity tolerance;Decreased balance;Decreased mobility;Decreased coordination;Decreased knowledge of use of DME;Decreased safety awareness;Decreased knowledge of precautions       PT Treatment Interventions DME instruction;Gait training;Stair training;Functional mobility training;Therapeutic activities;Therapeutic exercise;Balance training;Neuromuscular re-education;Patient/family education    PT Goals (Current goals can be found in the Care Plan section)  Acute Rehab PT Goals Patient Stated Goal: return home PT Goal Formulation: With patient Time For Goal Achievement: 09/14/22 Potential to Achieve Goals: Good    Frequency Min 1X/week     Co-evaluation               AM-PAC PT "6 Clicks" Mobility  Outcome Measure Help needed turning from your back to your side while in a flat bed without using bedrails?: None Help needed moving from lying on your back to sitting on the side of a flat bed without using bedrails?: None Help needed moving to and from a bed to a chair (including a wheelchair)?: A Little Help needed standing up from a chair using your arms (e.g., wheelchair or bedside chair)?: A Little Help needed to walk in hospital room?: A Little Help needed  climbing 3-5 steps with a railing? : A Little 6 Click Score: 20    End of Session   Activity Tolerance: Patient tolerated treatment well Patient left: in bed;with call bell/phone within reach;with bed alarm set   PT Visit Diagnosis: Other abnormalities of gait and mobility (R26.89);Muscle weakness (generalized) (M62.81)    Time: 4098-1191 PT Time Calculation (min) (ACUTE ONLY): 29 min   Charges:   PT Evaluation $PT Eval Low Complexity: 1 Low PT Treatments $Therapeutic Activity: 8-22 mins PT General Charges $$ ACUTE PT VISIT: 1 Visit        Tanay Massiah, PT, SPT 3:39 PM,08/31/22

## 2022-08-31 NOTE — Progress Notes (Signed)
Mobility Specialist - Progress Note   08/31/22 1155  Mobility  Activity Ambulated with assistance in room;Ambulated with assistance to bathroom  Level of Assistance Standby assist, set-up cues, supervision of patient - no hands on  Assistive Device Front wheel walker  Distance Ambulated (ft) 25 ft  Activity Response Tolerated well  $Mobility charge 1 Mobility     Pt lying in bed upon arrival, utilizing RA. Pt completed bed mobility independently. STS and ambulation in room with minG. No LOB. No complaints. BM. Pt able to perform seated peri-care independently. Pt returned to bed, at pt request, with alarm set and needs in reach.    Christine Savage Mobility Specialist 08/31/22, 11:57 AM

## 2022-09-01 DIAGNOSIS — I214 Non-ST elevation (NSTEMI) myocardial infarction: Secondary | ICD-10-CM | POA: Diagnosis not present

## 2022-09-01 LAB — CBC
HCT: 42.5 % (ref 36.0–46.0)
Hemoglobin: 14.3 g/dL (ref 12.0–15.0)
MCH: 30.5 pg (ref 26.0–34.0)
MCHC: 33.6 g/dL (ref 30.0–36.0)
MCV: 90.6 fL (ref 80.0–100.0)
Platelets: 281 10*3/uL (ref 150–400)
RBC: 4.69 MIL/uL (ref 3.87–5.11)
RDW: 15.9 % — ABNORMAL HIGH (ref 11.5–15.5)
WBC: 9.8 10*3/uL (ref 4.0–10.5)
nRBC: 0 % (ref 0.0–0.2)

## 2022-09-01 MED ORDER — CIPROFLOXACIN HCL 500 MG PO TABS: 500.0000 mg | ORAL_TABLET | Freq: Two times a day (BID) | ORAL | 0 refills | Status: AC

## 2022-09-01 NOTE — Care Management Important Message (Signed)
Important Message  Patient Details  Name: Christine Savage MRN: [id number] Date of Birth: 1945/08/02   Medicare Important Message Given:  Other (see comment)  Attempted to reach patient via room phone [phone]) to review Medicare IM.  No answer upon attempt.   [name] 09/01/2022, 12:07 PM

## 2022-09-01 NOTE — Care Management Important Message (Signed)
Important Message  Patient Details  Name: Christine Savage MRN: [id number] Date of Birth: [birth year]   Medicare Important Message Given:  Yes     [name] 09/01/2022, 1:37 PM

## 2022-09-01 NOTE — Plan of Care (Signed)
The patient has been discharged. IV has been removed. Education provided to patient and son. The patient has been wheeled down via wheelchair.  Problem: Acute Rehab OT Goals (only OT should resolve) Goal: Pt. Will Perform Eating Outcome: Completed/Met Goal: Pt. Will Perform Grooming Outcome: Completed/Met Goal: Pt. Will Perform Upper Body Bathing Outcome: Completed/Met Goal: Pt. Will Perform Lower Body Bathing Outcome: Completed/Met Goal: Pt. Will Perform Upper Body Dressing Outcome: Completed/Met Goal: Pt. Will Perform Lower Body Dressing Outcome: Completed/Met Goal: Pt. Will Transfer To Toilet Outcome: Completed/Met Goal: Pt. Will Perform Toileting-Clothing Manipulation Outcome: Completed/Met Goal: Pt. Will Perform Tub/Shower Transfer Outcome: Completed/Met Goal: Pt/Caregiver Will Perform Home Exercise Program Outcome: Completed/Met Goal: OT Additional ADL Goal #1 Outcome: Completed/Met Goal: OT Additional ADL Goal #2 Outcome: Completed/Met Goal: OT Additional ADL Goal #3 Outcome: Completed/Met Goal: OT Additional ADL Goal #4 Outcome: Completed/Met

## 2022-09-01 NOTE — Evaluation (Addendum)
Occupational Therapy Evaluation Patient Details Name: Christine Savage MRN: [id number] DOB: [birth year] Today's Date: 09/01/2022   History of Present Illness Pt presents due to initial complaints of SOB, LE swelling, nausea, and diarreha. Initial findings discovered elevated troponins, determined to be demand ischemia. PMH includes arthritis, hyperlipidemia, gastric reflux, ocular melanoma metastisized to liver, and hypothyroidism.   Clinical Impression   Christine Savage was seen for OT evaluation this date. Prior to hospital admission, pt was IND. Pt currently MOD I don underwear seated on BSC. SUPERVISION + RW standing hygiene and functional mobility ~200 ft. Reports near baseline functioning, will sign off. Upon hospital discharge, recommend OT follow up.    If plan is discharge home, recommend the following: Assistance with cooking/housework    Functional Status Assessment  Patient has had a recent decline in their functional status and demonstrates the ability to make significant improvements in function in a reasonable and predictable amount of time.  Equipment Recommendations  None recommended by OT    Recommendations for Other Services       Precautions / Restrictions Precautions Precautions: Fall Restrictions Weight Bearing Restrictions: No      Mobility Bed Mobility Overal bed mobility: Modified Independent                  Transfers Overall transfer level: Needs assistance Equipment used: Rolling walker (2 wheels) Transfers: Sit to/from Stand Sit to Stand: Supervision                  Balance Overall balance assessment: No apparent balance deficits (not formally assessed)                                         ADL either performed or assessed with clinical judgement   ADL Overall ADL's : Needs assistance/impaired                                       General ADL Comments: MOD I don underwear seated on BSC.  SUPERVISION + RW standing hygiene and functional mobility~200 ft.      Pertinent Vitals/Pain Pain Assessment Pain Assessment: No/denies pain     Extremity/Trunk Assessment Upper Extremity Assessment Upper Extremity Assessment: Overall WFL for tasks assessed   Lower Extremity Assessment Lower Extremity Assessment: Overall WFL for tasks assessed       Communication Communication Communication: No apparent difficulties   Cognition Arousal: Alert Behavior During Therapy: WFL for tasks assessed/performed Overall Cognitive Status: Within Functional Limits for tasks assessed                                                  Home Living Family/patient expects to be discharged to:: Private residence Living Arrangements: Alone Available Help at Discharge: Family;Available PRN/intermittently Type of Home: House Home Access: Stairs to enter                                Prior Functioning/Environment Prior Level of Function : Independent/Modified Independent  OT Problem List: Decreased strength;Decreased activity tolerance      OT Treatment/Interventions: Self-care/ADL training;Therapeutic exercise;Energy conservation;DME and/or AE instruction;Therapeutic activities    OT Goals(Current goals can be found in the care plan section) Acute Rehab OT Goals Patient Stated Goal: to go home OT Goal Formulation: With patient Time For Goal Achievement: 09/15/22 Potential to Achieve Goals: Good  OT Frequency: Min 1X/week    Co-evaluation              AM-PAC OT "6 Clicks" Daily Activity     Outcome Measure Help from another person eating meals?: None Help from another person taking care of personal grooming?: None Help from another person toileting, which includes using toliet, bedpan, or urinal?: A Little Help from another person bathing (including washing, rinsing, drying)?: None Help from another person to put  on and taking off regular upper body clothing?: A Little Help from another person to put on and taking off regular lower body clothing?: None 6 Click Score: 22   End of Session    Activity Tolerance: Patient tolerated treatment well Patient left: in bed;with call bell/phone within reach  OT Visit Diagnosis: Muscle weakness (generalized) (M62.81)                Time: 4098-1191 OT Time Calculation (min): 16 min Charges:  OT General Charges $OT Visit: 1 Visit OT Evaluation $OT Eval Low Complexity: 1 Low OT Treatments $Self Care/Home Management : 8-22 mins  Kathie Dike, M.S. OTR/L  09/01/22, 12:20 PM  ascom [phone]

## 2022-09-01 NOTE — Discharge Summary (Signed)
Physician Discharge Summary  Christine Savage:[id number] DOB: [birth year] DOA: 08/28/2022  PCP: Marina Goodell, MD  Admit date: 08/28/2022 Discharge date: 09/01/2022  Admitted From: Home Disposition: Home with home health  Recommendations for Outpatient Follow-up:  Follow up with PCP in 1-2 weeks Follow-up with cancer center as directed  Home Health: Yes PT OT Equipment/Devices: None  Discharge Condition: Stable CODE STATUS: DNR Diet recommendation: Regular  Brief/Interim Summary:  Christine Savage is a 77 y.o. 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   They presented from home to the ED on 08/28/2022 with SOB, abdominal pain, diarrhea x 2 weeks. Also has had progressively worsened swelling of LEs.  They were initially treated with heparin gtt. Cardiology was consulted.    Discharge Diagnoses:  Principal Problem:   NSTEMI (non-ST elevated myocardial infarction) [facility]) Active Problems:   Colitis  NSTEMI, ruled out No chest pain or shortness of breath.  Echo unremarkable.  Felt to be due to demand ischemia. Plan: No plans for ischemic evaluation as inpatient Outpatient follow-up   Colitis  infectious vs autoimmune.  Abdominal pain has improved.  GI without further treatment recommendations. Plan: De-escalate to ciprofloxacin.  Complete additional 3 days for total 7-day antibiotic course.  No plans for inpatient EGD.   Metastatic choroidal melanoma with liver metastases Unfortunately patient's disease has progressed despite 3 cycles of ipilimumab and nivolumab immunotherapy.  Oncology consulted.  Per oncology no meaningful treatment options here at [facility].  Patient was referred to [facility] for consideration of advanced therapeutic options.  Patient will need to improve her functional status prior to consideration of any of these treatments. Plan: Discharged home with home health services.  Patient's  son will follow-up with [facility] for potential outpatient referral to [facility].  Dr. Smith Robert aware of discharge plan.     Dysphagia Patient seems to be tolerating p.o. intake No plans for endoscopy   GERD -ppi    Hypothyroidism  -resume supplement  Discharge Instructions  Discharge Instructions     Amb Referral to Palliative Care   Complete by: As directed    Diet - low sodium heart healthy   Complete by: As directed    Diet - low sodium heart healthy   Complete by: As directed    Increase activity slowly   Complete by: As directed    Increase activity slowly   Complete by: As directed       Allergies as of 09/01/2022       Reactions   Latex Rash   From Bandaids   Sulfa Antibiotics Nausea Only        Medication List     TAKE these medications    Calcium Carbonate-Vitamin D 600-400 MG-UNIT tablet Take by mouth.   ciprofloxacin 500 MG tablet Commonly known as: Cipro Take 1 tablet (500 mg total) by mouth 2 (two) times daily for 3 days. Start taking on: September 02, 2022   Cyanocobalamin 1000 MCG Tbcr Take by mouth.   diphenhydrAMINE 25 mg capsule Commonly known as: BENADRYL Take by mouth.   docusate sodium 100 MG capsule Commonly known as: COLACE Take 100 mg by mouth.   FISH OIL PO Take by mouth.   levothyroxine 50 MCG tablet Commonly known as: SYNTHROID TAKE 1 TABLET (50 MCG TOTAL) BY MOUTH ONCE DAILY.   lidocaine-prilocaine cream Commonly known as: EMLA Apply to affected area once   loperamide 2 MG tablet Commonly known as: IMODIUM  A-D Take 2 mg by mouth 4 (four) times daily as needed for diarrhea or loose stools.   ondansetron 8 MG tablet Commonly known as: Zofran Take 1 tablet (8 mg total) by mouth every 8 (eight) hours as needed for nausea or vomiting.   pantoprazole 40 MG tablet Commonly known as: PROTONIX Take 40 mg by mouth 2 (two) times daily.   pravastatin 20 MG tablet Commonly known as: PRAVACHOL Take by mouth.    prochlorperazine 10 MG tablet Commonly known as: COMPAZINE Take 1 tablet (10 mg total) by mouth every 6 (six) hours as needed for nausea or vomiting.   sodium chloride 0.65 % nasal spray Commonly known as: OCEAN Place into the nose.               Durable Medical Equipment  (From admission, onward)           Start     Ordered   09/01/22 1211  For home use only DME Walker rolling  Once       Question Answer Comment  Walker: With 5 Inch Wheels   Patient needs a walker to treat with the following condition Weakness      09/01/22 1214            Allergies  Allergen Reactions   Latex Rash    From Bandaids   Sulfa Antibiotics Nausea Only    Consultations: Oncology Palliative care GI   Procedures/Studies: ECHOCARDIOGRAM COMPLETE  Result Date: 08/29/2022    ECHOCARDIOGRAM REPORT   Patient Name:   Christine Savage Date of Exam: 08/29/2022 Medical Rec #:  [id number]     Height:       65.0 in Accession #:    [id number]    Weight:       169.0 lb Date of Birth:  August 27, 1945     BSA:          1.841 m Patient Age:    77 years      BP:           108/82 mmHg Patient Gender: F             HR:           76 bpm. Exam Location:  [facility] Procedure: 2D Echo, Cardiac Doppler, Color Doppler and Strain Analysis Indications:     NSTEMI  History:         Patient has no prior history of Echocardiogram examinations.                  Acute MI; Risk Factors:Dyslipidemia. Melonoma.  Sonographer:     Mikki Harbor Referring Phys:  [id number] SARA-MAIZ A THOMAS Diagnosing Phys: Lorine Bears MD  Sonographer Comments: Global longitudinal strain was attempted. IMPRESSIONS  1. Left ventricular ejection fraction, by estimation, is 55 to 60%. The left ventricle has normal function. The left ventricle has no regional wall motion abnormalities. There is mild left ventricular hypertrophy. Left ventricular diastolic parameters were normal. The average left ventricular global longitudinal strain is -20.8 %. The  global longitudinal strain is normal.  2. Right ventricular systolic function is normal. The right ventricular size is normal. There is normal pulmonary artery systolic pressure.  3. The mitral valve is normal in structure. Mild mitral valve regurgitation. No evidence of mitral stenosis.  4. The aortic valve is normal in structure. Aortic valve regurgitation is not visualized. No aortic stenosis is present.  5. The inferior vena cava is normal in size with greater than  50% respiratory variability, suggesting right atrial pressure of 3 mmHg. FINDINGS  Left Ventricle: Left ventricular ejection fraction, by estimation, is 55 to 60%. The left ventricle has normal function. The left ventricle has no regional wall motion abnormalities. The average left ventricular global longitudinal strain is -20.8 %. The global longitudinal strain is normal. The left ventricular internal cavity size was normal in size. There is mild left ventricular hypertrophy. Left ventricular diastolic parameters were normal. Right Ventricle: The right ventricular size is normal. No increase in right ventricular wall thickness. Right ventricular systolic function is normal. There is normal pulmonary artery systolic pressure. The tricuspid regurgitant velocity is 2.38 m/s, and  with an assumed right atrial pressure of 3 mmHg, the estimated right ventricular systolic pressure is 25.7 mmHg. Left Atrium: Left atrial size was normal in size. Right Atrium: Right atrial size was normal in size. Pericardium: There is no evidence of pericardial effusion. Mitral Valve: The mitral valve is normal in structure. Mild mitral valve regurgitation. No evidence of mitral valve stenosis. MV peak gradient, 4.1 mmHg. The mean mitral valve gradient is 2.0 mmHg. Tricuspid Valve: The tricuspid valve is normal in structure. Tricuspid valve regurgitation is mild . No evidence of tricuspid stenosis. Aortic Valve: The aortic valve is normal in structure. Aortic valve  regurgitation is not visualized. No aortic stenosis is present. Aortic valve mean gradient measures 4.0 mmHg. Aortic valve peak gradient measures 11.0 mmHg. Aortic valve area, by VTI measures 2.87 cm. Pulmonic Valve: The pulmonic valve was normal in structure. Pulmonic valve regurgitation is not visualized. No evidence of pulmonic stenosis. Aorta: The aortic root is normal in size and structure. Venous: The inferior vena cava is normal in size with greater than 50% respiratory variability, suggesting right atrial pressure of 3 mmHg. IAS/Shunts: No atrial level shunt detected by color flow Doppler.  LEFT VENTRICLE PLAX 2D LVIDd:         4.50 cm     Diastology LVIDs:         3.20 cm     LV e' medial:    9.79 cm/s LV PW:         1.30 cm     LV E/e' medial:  8.3 LV IVS:        1.30 cm     LV e' lateral:   12.10 cm/s LVOT diam:     2.10 cm     LV E/e' lateral: 6.7 LV SV:         96 LV SV Index:   52          2D Longitudinal Strain LVOT Area:     3.46 cm    2D Strain GLS Avg:     -20.8 %  LV Volumes (MOD) LV vol d, MOD A2C: 45.4 ml LV vol d, MOD A4C: 43.4 ml LV vol s, MOD A2C: 15.2 ml LV vol s, MOD A4C: 21.1 ml LV SV MOD A2C:     30.2 ml LV SV MOD A4C:     43.4 ml LV SV MOD BP:      29.0 ml RIGHT VENTRICLE RV Basal diam:  3.05 cm RV Mid diam:    3.00 cm RV S prime:     15.10 cm/s TAPSE (M-mode): 2.4 cm LEFT ATRIUM             Index        RIGHT ATRIUM           Index LA diam:  3.60 cm 1.95 cm/m   RA Area:     16.00 cm LA Vol (A2C):   45.0 ml 24.44 ml/m  RA Volume:   39.00 ml  21.18 ml/m LA Vol (A4C):   34.5 ml 18.73 ml/m LA Biplane Vol: 39.4 ml 21.40 ml/m  AORTIC VALVE                    PULMONIC VALVE AV Area (Vmax):    2.55 cm     PV Vmax:       0.82 m/s AV Area (Vmean):   2.73 cm     PV Peak grad:  2.7 mmHg AV Area (VTI):     2.87 cm AV Vmax:           166.00 cm/s AV Vmean:          93.900 cm/s AV VTI:            0.333 m AV Peak Grad:      11.0 mmHg AV Mean Grad:      4.0 mmHg LVOT Vmax:         122.00  cm/s LVOT Vmean:        74.000 cm/s LVOT VTI:          0.276 m LVOT/AV VTI ratio: 0.83  AORTA Ao Root diam: 3.00 cm Ao Asc diam:  3.60 cm MITRAL VALVE               TRICUSPID VALVE MV Area (PHT): 3.26 cm    TR Peak grad:   22.7 mmHg MV Area VTI:   2.99 cm    TR Vmax:        238.00 cm/s MV Peak grad:  4.1 mmHg MV Mean grad:  2.0 mmHg    SHUNTS MV Vmax:       1.01 m/s    Systemic VTI:  0.28 m MV Vmean:      69.4 cm/s   Systemic Diam: 2.10 cm MV Decel Time: 233 msec MV E velocity: 81.20 cm/s MV A velocity: 91.20 cm/s MV E/A ratio:  0.89 Lorine Bears MD Electronically signed by Lorine Bears MD Signature Date/Time: 08/29/2022/3:46:25 PM    Final    US Venous Img Lower Unilateral Left (DVT)  Result Date: 08/28/2022 CLINICAL DATA:  Pain, swelling of left lower extremity EXAM: LEFT LOWER EXTREMITY VENOUS DOPPLER ULTRASOUND TECHNIQUE: Gray-scale sonography with compression, as well as color and duplex ultrasound, were performed to evaluate the deep venous system(s) from the level of the common femoral vein through the popliteal and proximal calf veins. COMPARISON:  None Available. FINDINGS: VENOUS Normal compressibility of the common femoral, superficial femoral, and popliteal veins, as well as the visualized calf veins. Visualized portions of profunda femoral vein and great saphenous vein unremarkable. No filling defects to suggest DVT on grayscale or color Doppler imaging. Doppler waveforms show normal direction of venous flow, normal respiratory plasticity and response to augmentation. Limited views of the contralateral common femoral vein are unremarkable. OTHER None. Limitations: none IMPRESSION: Negative. Electronically Signed   By: Charlett Nose M.D.   On: 08/28/2022 22:05   CT Angio Chest PE W and/or Wo Contrast  Result Date: 08/28/2022 CLINICAL DATA:  Diarrhea and abdominal pain. Shortness of breath. Known metastatic melanoma. EXAM: CT ANGIOGRAPHY CHEST WITH CONTRAST TECHNIQUE: Multidetector CT imaging  of the chest was performed using the standard protocol during bolus administration of intravenous contrast. Multiplanar CT image reconstructions and MIPs were obtained to evaluate the vascular anatomy.  RADIATION DOSE REDUCTION: This exam was performed according to the departmental dose-optimization program which includes automated exposure control, adjustment of the mA and/or kV according to patient size and/or use of iterative reconstruction technique. CONTRAST:  80mL OMNIPAQUE IOHEXOL 350 MG/ML SOLN COMPARISON:  Chest radiograph performed the same day and PET-CT dated 08/08/2022. FINDINGS: Cardiovascular: Satisfactory opacification of the pulmonary arteries to the segmental level. No evidence of pulmonary embolism. There are coronary artery calcifications. The heart is enlarged. No pericardial effusion. A right internal jugular central venous port catheter tip terminates at the superior cavoatrial junction. Mediastinum/Nodes: Numerous enlarged mediastinal and hilar lymph nodes appear similar to prior exam. For reference, a precarinal lymph node measures 1.3 cm in short axis (series 4, image 49). No enlarged axillary lymph nodes. Thyroid gland, trachea, and esophagus demonstrate no significant findings. Lungs/Pleura: There is moderate bilateral lower lobe atelectasis, increased compared to 08/08/2022. Multiple bulla are seen in the left upper lobe. No pleural effusion or pneumothorax. Upper Abdomen: The liver has a nodular surface contour, unchanged. Previously described metastatic disease in the liver is not appreciated on this exam. Musculoskeletal: Degenerative changes are seen in the spine. Review of the MIP images confirms the above findings. IMPRESSION: 1. No evidence of pulmonary embolism. 2. Moderate bilateral lower lobe atelectasis, increased compared to 08/08/2022. 3. Numerous enlarged mediastinal and hilar lymph nodes appear similar to prior exam and are consistent with metastatic disease. Electronically  Signed   By: Romona Curls M.D.   On: 08/28/2022 17:58   CT ABDOMEN PELVIS W CONTRAST  Result Date: 08/28/2022 CLINICAL DATA:  Acute abdominal pain.  Metastatic melanoma. EXAM: CT ABDOMEN AND PELVIS WITH CONTRAST TECHNIQUE: Multidetector CT imaging of the abdomen and pelvis was performed using the standard protocol following bolus administration of intravenous contrast. RADIATION DOSE REDUCTION: This exam was performed according to the departmental dose-optimization program which includes automated exposure control, adjustment of the mA and/or kV according to patient size and/or use of iterative reconstruction technique. CONTRAST:  80mL OMNIPAQUE IOHEXOL 350 MG/ML SOLN COMPARISON:  PET-CT 08/08/2022 FINDINGS: Lower chest: There is a small amount of atelectasis in the lung bases. Hepatobiliary: Hypodense hepatic metastatic lesion in the dome measures 2.0 x 2.6 cm and appears grossly unchanged. The liver is diffusely heterogeneous and small additional nodular densities can not be excluded on this study. Progression of metastatic disease is not excluded. Nodular liver contour appears stable. Gallbladder and bile ducts are within normal limits. Pancreas: Unremarkable. No pancreatic ductal dilatation or surrounding inflammatory changes. Spleen: Normal in size without focal abnormality. Adrenals/Urinary Tract: Adrenal glands are unremarkable. Kidneys are normal, without renal calculi, focal lesion, or hydronephrosis. Bladder is unremarkable. Stomach/Bowel: There is mild wall thickening of the cecum with trace surrounding inflammatory stranding. There is no bowel obstruction, pneumatosis or free air. The appendix is within normal limits. Small bowel and stomach are within normal limits. Duodenal diverticulum is present. Vascular/Lymphatic: Aortic atherosclerosis. No enlarged abdominal or pelvic lymph nodes. Reproductive: Status post hysterectomy. No adnexal masses. Other: There is a small amount of free fluid in the  lower abdomen and pelvis. Musculoskeletal: No acute or significant osseous findings. IMPRESSION: 1. Mild wall thickening of the cecum with trace surrounding inflammatory stranding worrisome for infectious or inflammatory colitis. 2. Small amount of free fluid in the lower abdomen and pelvis. 3. Dominant hepatic lesion appears grossly unchanged. The liver is diffusely heterogeneous and additional small nodular densities can not be excluded on this study. Progression of metastatic disease is not excluded. Consider  further evaluation with MRI. 4. Aortic atherosclerosis. Aortic Atherosclerosis (ICD10-I70.0). Electronically Signed   By: Darliss Cheney M.D.   On: 08/28/2022 17:48   DG Chest 2 View  Result Date: 08/28/2022 CLINICAL DATA:  Shortness of breath. EXAM: CHEST - 2 VIEW COMPARISON:  10/08/2010, PET-CT 08/08/2022 FINDINGS: Right IJ Port-A-Cath has tip over the SVC. Lungs are hypoinflated with minimal bibasilar linear density left worse than right likely atelectasis as seen on recent PET-CT. No significant effusion. Cardiomediastinal silhouette and remainder the exam is unchanged. IMPRESSION: Hypoinflation with minimal bibasilar linear density left worse than right likely atelectasis. Electronically Signed   By: Elberta Fortis M.D.   On: 08/28/2022 15:01   NM PET Image Restag (PS) Skull Base To Thigh  Result Date: 08/10/2022 CLINICAL DATA:  Subsequent treatment strategy for metastatic melanoma. EXAM: NUCLEAR MEDICINE PET SKULL BASE TO THIGH TECHNIQUE: 9.04 mCi F-18 FDG was injected intravenously. Full-ring PET imaging was performed from the skull base to thigh after the radiotracer. CT data was obtained and used for attenuation correction and anatomic localization. Fasting blood glucose: 103 mg/dl COMPARISON:  PET-CT 30/86/5784.  Abdominal MRI 05/23/2022. FINDINGS: Mediastinal blood pool activity: SUV max 2.8 NECK: No hypermetabolic cervical lymph nodes are identified. No suspicious activity identified within  the pharyngeal mucosal space. Incidental CT findings: none CHEST: Patient has developed numerous hypermetabolic mediastinal and hilar lymph nodes bilaterally. There is a precarinal node measuring 1.4 cm short axis on image 55/4 which has an SUV max of 9.5. No other significantly enlarged lymph nodes are identified on noncontrast imaging. Additional nodes include a right hilar node with an SUV max of 8.2 and a left hilar node with an SUV max of 8.8. No axillary adenopathy. No hypermetabolic pulmonary activity or suspicious nodularity. Incidental CT findings: Right IJ Port-A-Cath extends to the mid right atrium. Mild aortic and coronary artery atherosclerosis. Stable chronic lung disease with scattered bullous changes and parenchymal scarring. ABDOMEN/PELVIS: Previously demonstrated dominant hypermetabolic lesion anteriorly in the dome of the right hepatic lobe has increased in size and metabolic activity. The lesion is not well visualized on the CT images, but the area of hypermetabolic activity measures approximately 4.1 cm in greatest dimension and has an SUV max of 10.0 (previously 9.8). At least 2 other enlarging hypermetabolic liver lesions are seen. No hypermetabolic activity within the adrenal glands, spleen or pancreas. There is no hypermetabolic nodal activity in the abdomen or pelvis. Low level activity within lymph nodes in the porta hepatis is probably physiologic. Bowel activity within physiologic limits. 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 Aortic and branch vessel atherosclerosis. SKELETON: Persistent hypermetabolic lesion within the L5 vertebral body. This lesion measures approximately 1.0 cm in diameter and is sclerotic (image 120/4) with an SUV max of 6.7 (previously 6.6). Previously noted small focus of hypermetabolic activity in the proximal left humerus appears less obvious. No new osseous lesions identified. Incidental CT findings: none IMPRESSION: 1.  Interval enlargement of previously demonstrated hypermetabolic hepatic lesions consistent with progressive metastatic disease. 2. Interval development of hypermetabolic mediastinal and hilar lymph nodes, most consistent with metastatic disease. 3. Persistent hypermetabolic lesion within the L5 vertebral body. No new osseous lesions identified. 4.  Aortic Atherosclerosis (ICD10-I70.0). Electronically Signed   By: Carey Bullocks M.D.   On: 08/10/2022 14:03      Subjective: Seen and examined on the day of discharge.  Stable no distress.  Appropriate for discharge home.  Discharge Exam: Vitals:   09/01/22 0231  09/01/22 0945  BP: (!) 103/59 115/73  Pulse: 79 97  Resp: 16 18  Temp: 97.7 F (36.5 C) 97.6 F (36.4 C)  SpO2: 92% 93%   Vitals:   08/31/22 1933 09/01/22 0231 09/01/22 0424 09/01/22 0945  BP: 132/72 (!) 103/59  115/73  Pulse: 78 79  97  Resp: 16 16  18   Temp: 97.9 F (36.6 C) 97.7 F (36.5 C)  97.6 F (36.4 C)  TempSrc:    Oral  SpO2: 94% 92%  93%  Weight:   80.3 kg   Height:        General: Pt is alert, awake, not in acute distress Cardiovascular: RRR, S1/S2 +, no rubs, no gallops Respiratory: CTA bilaterally, no wheezing, no rhonchi Abdominal: Soft, NT, ND, bowel sounds + Extremities: no edema, no cyanosis    The results of significant diagnostics from this hospitalization (including imaging, microbiology, ancillary and laboratory) are listed below for reference.     Microbiology: Recent Results (from the past 240 hour(s))  MRSA Next Gen by PCR, Nasal     Status: None   Collection Time: 08/29/22  7:53 AM   Specimen: Nasal Mucosa; Nasal Swab  Result Value Ref Range Status   MRSA by PCR Next Gen NOT DETECTED NOT DETECTED Final    Comment: (NOTE) The GeneXpert MRSA Assay (FDA approved for NASAL specimens only), is one component of a comprehensive MRSA colonization surveillance program. It is not intended to diagnose MRSA infection nor to guide or monitor  treatment for MRSA infections. Test performance is not FDA approved in patients less than 74 years old. Performed at [facility], [address] [place], [place] [address]      Labs: BNP (last 3 results) Recent Labs    08/28/22 1424  BNP 41.7   Basic Metabolic Panel: Recent Labs  Lab 08/28/22 1424 08/30/22 0517 08/31/22 0527  NA 135 136 136  K 3.8 3.7 3.6  CL 101 106 104  CO2 25 25 26   GLUCOSE 110* 105* 94  BUN 10 11 12   CREATININE 0.73 0.75 0.69  CALCIUM 8.3* 7.5* 7.7*   Liver Function Tests: Recent Labs  Lab 08/28/22 1424 08/30/22 0517  AST 164* 104*  ALT 109* 74*  ALKPHOS 160* 123  BILITOT 2.7* 1.4*  PROT 5.3* 4.2*  ALBUMIN 2.8* 2.1*   Recent Labs  Lab 08/28/22 1424  LIPASE 35   No results for input(s): "AMMONIA" in the last 168 hours. CBC: Recent Labs  Lab 08/28/22 1424 08/29/22 0530 08/30/22 0517 08/31/22 0527 09/01/22 0609  WBC 10.6* 8.9 8.6 7.5 9.8  HGB 15.5* 13.7 13.2 13.6 14.3  HCT 47.1* 42.1 39.2 40.5 42.5  MCV 92.0 93.3 92.2 90.4 90.6  PLT 300 254 231 252 281   Cardiac Enzymes: No results for input(s): "CKTOTAL", "CKMB", "CKMBINDEX", "TROPONINI" in the last 168 hours. BNP: Invalid input(s): "POCBNP" CBG: Recent Labs  Lab 08/29/22 0759  GLUCAP 86   D-Dimer No results for input(s): "DDIMER" in the last 72 hours. Hgb A1c No results for input(s): "HGBA1C" in the last 72 hours. Lipid Profile No results for input(s): "CHOL", "HDL", "LDLCALC", "TRIG", "CHOLHDL", "LDLDIRECT" in the last 72 hours. Thyroid function studies No results for input(s): "TSH", "T4TOTAL", "T3FREE", "THYROIDAB" in the last 72 hours.  Invalid input(s): "FREET3" Anemia work up No results for input(s): "VITAMINB12", "FOLATE", "FERRITIN", "TIBC", "IRON", "RETICCTPCT" in the last 72 hours. Urinalysis No results found for: "COLORURINE", "APPEARANCEUR", "LABSPEC", "PHURINE", "GLUCOSEU", "HGBUR", "BILIRUBINUR", "KETONESUR", "PROTEINUR", "UROBILINOGEN",  "  NITRITE", "LEUKOCYTESUR" Sepsis Labs Recent Labs  Lab 08/29/22 0530 08/30/22 0517 08/31/22 0527 09/01/22 0609  WBC 8.9 8.6 7.5 9.8   Microbiology Recent Results (from the past 240 hour(s))  MRSA Next Gen by PCR, Nasal     Status: None   Collection Time: 08/29/22  7:53 AM   Specimen: Nasal Mucosa; Nasal Swab  Result Value Ref Range Status   MRSA by PCR Next Gen NOT DETECTED NOT DETECTED Final    Comment: (NOTE) The GeneXpert MRSA Assay (FDA approved for NASAL specimens only), is one component of a comprehensive MRSA colonization surveillance program. It is not intended to diagnose MRSA infection nor to guide or monitor treatment for MRSA infections. Test performance is not FDA approved in patients less than 73 years old. Performed at [facility], [address], [place], [place] [id number]      Time coordinating discharge: Over 30 minutes  SIGNED:   Tresa Moore, MD  [facility] 09/01/2022, 1:28 PM Pager   If 7PM-7AM, please contact night-coverage

## 2022-09-01 NOTE — Progress Notes (Addendum)
[facility] Liaison Note:   (new referral for outpatient palliative services) Notified by [facility], Bevelyn Ngo, RN, of patient/family request for [facility] services at home after discharge. Referral submitted today.    Please call with any hospice or outpatient palliative care related questions. Thank you for the opportunity to participate in this patient's care.  Redge Gainer, [facility] Liaison [phone]

## 2022-09-01 NOTE — TOC Initial Note (Signed)
Transition of Care [facility]) - Initial/Assessment Note    Patient Details  Name: Christine Savage MRN: [id number] Date of Birth: 11-30-1945  Transition of Care [facility]) CM/SW Contact:    Chapman Fitch, RN Phone Number: 09/01/2022, 12:19 PM  Clinical Narrative:                  Admitted for: NSTEMI Admitted from: home with spouse.  Son Chanetta Marshall lives in the house in front of patient, and patient has to option to stay with him at discharge PCP: Levonne Lapping  Current home health/prior home health/DME: cane  Therapy recommending home health.  Patient in agreement.  States she does not have preference of home health agency.  Referral made and accepted by Coralee North with [facility].  Referral made to Jon with [employer] for RW        Patient Goals and CMS Choice            Expected Discharge Plan and Services         Expected Discharge Date: 09/01/22                                    Prior Living Arrangements/Services                       Activities of Daily Living Home Assistive Devices/Equipment: Eyeglasses ADL Screening (condition at time of admission) Patient's cognitive ability adequate to safely complete daily activities?: Yes Is the patient deaf or have difficulty hearing?: No Does the patient have difficulty seeing, even when wearing glasses/contacts?: No Does the patient have difficulty concentrating, remembering, or making decisions?: No Patient able to express need for assistance with ADLs?: Yes Does the patient have difficulty dressing or bathing?: No Independently performs ADLs?: Yes (appropriate for developmental age) Does the patient have difficulty walking or climbing stairs?: No Weakness of Legs: None Weakness of Arms/Hands: None  Permission Sought/Granted                  Emotional Assessment              Admission diagnosis:  Colitis [K52.9] NSTEMI (non-ST elevated myocardial infarction) [facility]) [I21.4] Patient Active Problem List    Diagnosis Date Noted   Colitis 08/29/2022   NSTEMI (non-ST elevated myocardial infarction) (HCC) 08/28/2022   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) 06/14/2022   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   Eye cancer (HCC) 06/08/2022   History of colonic polyps    Change in bowel habit    Polyp of transverse colon    Special screening for malignant neoplasms, colon    Benign neoplasm of descending colon    Dysphagia    Mild dietary indigestion    Hypothyroidism    History of recurrent UTIs 06/15/2015   Primary osteoarthritis involving multiple joints 06/15/2015   PMR (polymyalgia rheumatica) (HCC) 12/24/2014   Acute pain of left shoulder 12/16/2014   Arthralgia of both hands 12/16/2014   Arthritis 12/16/2014   Chronic bilateral low back pain without sciatica 12/16/2014   Chronic neck pain 12/16/2014   Dyslipidemia 12/16/2014   Gastroesophageal reflux disease without esophagitis 12/16/2014   Osteoarthritis 12/16/2014   Weakness generalized 12/16/2014   Hypercholesterolemia 12/13/2011   Insomnia 12/13/2011   PCP:  Marina Goodell, MD Pharmacy:   [phone] - [place], [place] - [address] [place] [address]  Phone: [phone] Fax: [phone]     Social Determinants of Health (SDOH) Social History: SDOH Screenings   Food Insecurity: No Food Insecurity (08/29/2022)  Housing: Low Risk  (08/29/2022)  Transportation Needs: No Transportation Needs (08/29/2022)  Utilities: Not At Risk (08/29/2022)  Tobacco Use: Low Risk  (08/29/2022)   SDOH Interventions:     Readmission Risk Interventions     No data to display

## 2022-09-02 ENCOUNTER — Telehealth: Payer: Self-pay | Admitting: *Deleted

## 2022-09-02 DIAGNOSIS — C6941 Malignant neoplasm of right ciliary body: Secondary | ICD-10-CM

## 2022-09-02 NOTE — Consult Note (Signed)
[facility] Customer service manager [facility]) Accountable Care Organization (ACO) [facility] Liaison Note  09/02/2022  Christine Savage 1945/03/09 [id number]  Location: [facility] RN Hospital Liaison screened the patient remotely at [facility].  Insurance: [employer]   Christine Savage is a 77 y.o. female who is a Primary Care Patient of Feldpausch, Madaline Guthrie, MD [facility]. The patient was screened for readmission hospitalization with noted medium risk score for unplanned readmission risk with 1 IP in 6 months.  The patient was assessed for potential [facility]) Care Management service needs for post hospital transition for care coordination. Review of patient's electronic medical record reveals patient was admitted for NSTEMI. Pt discharged home with [facility] for ongoing care. No other needs to address at this time for care management services.  [facility] Care Management/Population Health does not replace or interfere with any arrangements made by the Inpatient Transition of Care team.   For questions contact:   Christine Cousin, RN, [facility] Liaison [place]   Population Health Office Hours MTWF  8:00 am-6:00 pm Off on Thursday [phone] mobile [phone] [Office toll free line] Office Hours are M-F 8:30 - 5 pm [email or web address]

## 2022-09-02 NOTE — Telephone Encounter (Signed)
Son called reporting that patient was offered to get help at home by Dr Smith Robert and that it was to be ordered to start in a few weeks, but patient is requesting it to be ASAP. Christine Savage states that Dr Smith Robert called him after she was discharged from hospital with her heart attack. Please return his call regarding this matter

## 2022-09-05 ENCOUNTER — Telehealth: Payer: Self-pay | Admitting: *Deleted

## 2022-09-05 ENCOUNTER — Inpatient Hospital Stay: Payer: PPO | Attending: Oncology

## 2022-09-05 ENCOUNTER — Other Ambulatory Visit: Payer: Self-pay | Admitting: *Deleted

## 2022-09-05 ENCOUNTER — Encounter: Payer: Self-pay | Admitting: Oncology

## 2022-09-05 NOTE — Progress Notes (Signed)
[facility] Clinical Social Work  Clinical Social Work was referred by nurse for assessment of psychosocial needs.  Clinical Social Worker contacted caregiver by phone to offer support and assess for needs.    Spoke with patient's son, Chanetta Marshall.  He stated he would like to place his mother in a skilled nursing facility and wanted to know the cost.  After further discussion, CSW provided him with the contact information for Karen Kays at [facility] to provide him with that information.  He said Hospice would be contacting him tomorrow.  He expressed no other needs at this time.     Dorothey Baseman, LCSW  Clinical Social Worker [facility]

## 2022-09-05 NOTE — Telephone Encounter (Signed)
Son called asking to speak with someone about placing patient in a facility or at least getting Physical Therapy in home. Please return his call.

## 2022-09-05 NOTE — Telephone Encounter (Signed)
I spoke with Christine Savage at hospice. Order entered.

## 2022-09-05 NOTE — Telephone Encounter (Signed)
I called to let the son know that she has been trying to get in touch with him about his mom. She called Friday and then again today ad the voicmail is full, he states that it is the other son she is calling. I told him about 12:45 she will call him and I sent a sticky about all the issues going on.

## 2022-09-05 NOTE — Telephone Encounter (Signed)
Christine Savage- spoke to son Christine Savage he is in agreement with proceeding with hospice. Can you look into this asap?

## 2022-09-07 ENCOUNTER — Telehealth: Payer: Self-pay | Admitting: *Deleted

## 2022-09-07 NOTE — Telephone Encounter (Signed)
CAll returned to [facility] and gave verbal order for port flush every 8 weeks

## 2022-09-07 NOTE — Telephone Encounter (Signed)
Joy called asking if she needs to flush patient port that has not been flushed since July and if so, she needs orders to do so

## 2022-09-09 ENCOUNTER — Other Ambulatory Visit: Payer: Self-pay

## 2022-09-09 ENCOUNTER — Other Ambulatory Visit: Payer: Self-pay | Admitting: *Deleted

## 2022-09-09 ENCOUNTER — Telehealth: Payer: Self-pay

## 2022-09-09 DIAGNOSIS — C787 Secondary malignant neoplasm of liver and intrahepatic bile duct: Secondary | ICD-10-CM

## 2022-09-09 DIAGNOSIS — C6941 Malignant neoplasm of right ciliary body: Secondary | ICD-10-CM

## 2022-09-09 MED ORDER — PROCHLORPERAZINE MALEATE 10 MG PO TABS: 10.0000 mg | ORAL_TABLET | Freq: Four times a day (QID) | ORAL | 1 refills | Status: DC | PRN

## 2022-09-09 MED ORDER — ONDANSETRON HCL 8 MG PO TABS: 8.0000 mg | ORAL_TABLET | Freq: Three times a day (TID) | ORAL | 1 refills | Status: DC | PRN

## 2022-09-09 MED ORDER — OXYCODONE HCL 5 MG PO TABS: 5.0000 mg | ORAL_TABLET | ORAL | 0 refills | Status: DC | PRN

## 2022-09-09 NOTE — Telephone Encounter (Signed)
Can you send oxy 5 mg q4 prn along with zofran and compazine prn?

## 2022-09-09 NOTE — Telephone Encounter (Signed)
Joy from [employer] called stating that patient needs a few orders to be called into pharmacy and oxygen orders sent   -pain 5/10 requesting low dose oxy and extra strength Tylenol -low O2 sat in mid 80s with ambulation and 92% at rest -also does she need to alternate with Zofran and Compazine and needs a order for both  She says if sent to pharmacy then family  member can pick up. Please call her at [phone] with any questions

## 2022-09-15 ENCOUNTER — Telehealth: Payer: Self-pay | Admitting: *Deleted

## 2022-09-15 NOTE — Telephone Encounter (Signed)
Received life insurance Claim form from patient spouse, form completed and sent for doctor signature

## 2022-09-16 ENCOUNTER — Encounter: Payer: Self-pay | Admitting: Oncology

## 2022-09-16 NOTE — Telephone Encounter (Signed)
Form signed and call to inform ready for pickup

## 2022-09-20 ENCOUNTER — Other Ambulatory Visit: Payer: Self-pay | Admitting: Hospice and Palliative Medicine

## 2022-09-20 MED ORDER — MORPHINE SULFATE (CONCENTRATE) 10 MG /0.5 ML PO SOLN
5.0000 mg | ORAL | 0 refills | Status: DC | PRN
Start: 2022-09-20 — End: 2022-10-05

## 2022-09-20 MED ORDER — PANTOPRAZOLE SODIUM 40 MG PO PACK: 40.0000 mg | PACK | Freq: Every day | ORAL | 0 refills | Status: DC

## 2022-09-20 NOTE — Progress Notes (Signed)
I spoke with patient's hospice nurse, Joy.  Patient is now having dysphagia.  Has pain and having difficulty swallowing oxycodone.  Also having reflux symptoms and having difficulty swallowing Protonix tablets.  Will switch to morphine oral concentrate and Protonix oral suspension.

## 2022-10-11 DEATH — deceased

## 2023-01-12 NOTE — Telephone Encounter (Signed)
 Error
